# Patient Record
Sex: Female | Born: 1970
Health system: Southern US, Community
[De-identification: ages and names within clinical notes are randomized; demographics above are authoritative.]

## PROBLEM LIST (undated history)

## (undated) DIAGNOSIS — K219 Gastro-esophageal reflux disease without esophagitis: Secondary | ICD-10-CM

## (undated) DIAGNOSIS — B019 Varicella without complication: Secondary | ICD-10-CM

## (undated) DIAGNOSIS — I639 Cerebral infarction, unspecified: Secondary | ICD-10-CM

## (undated) DIAGNOSIS — R519 Headache, unspecified: Secondary | ICD-10-CM

## (undated) DIAGNOSIS — R55 Syncope and collapse: Secondary | ICD-10-CM

## (undated) DIAGNOSIS — I10 Essential (primary) hypertension: Secondary | ICD-10-CM

## (undated) DIAGNOSIS — R51 Headache: Secondary | ICD-10-CM

## (undated) DIAGNOSIS — M1990 Unspecified osteoarthritis, unspecified site: Secondary | ICD-10-CM

## (undated) HISTORY — PX: TUBAL LIGATION: SHX77

## (undated) HISTORY — DX: Gastro-esophageal reflux disease without esophagitis: K21.9

## (undated) HISTORY — DX: Unspecified osteoarthritis, unspecified site: M19.90

## (undated) HISTORY — DX: Headache: R51

## (undated) HISTORY — DX: Varicella without complication: B01.9

## (undated) HISTORY — DX: Syncope and collapse: R55

## (undated) HISTORY — DX: Headache, unspecified: R51.9

---

## 1998-10-21 ENCOUNTER — Inpatient Hospital Stay (HOSPITAL_COMMUNITY): Admission: AD | Admit: 1998-10-21 | Discharge: 1998-10-21 | Payer: Self-pay | Admitting: *Deleted

## 1999-11-07 ENCOUNTER — Emergency Department (HOSPITAL_COMMUNITY): Admission: EM | Admit: 1999-11-07 | Discharge: 1999-11-07 | Payer: Self-pay | Admitting: Emergency Medicine

## 1999-11-07 ENCOUNTER — Encounter: Payer: Self-pay | Admitting: Emergency Medicine

## 2000-02-23 ENCOUNTER — Emergency Department (HOSPITAL_COMMUNITY): Admission: EM | Admit: 2000-02-23 | Discharge: 2000-02-23 | Payer: Self-pay | Admitting: *Deleted

## 2000-02-24 ENCOUNTER — Emergency Department (HOSPITAL_COMMUNITY): Admission: EM | Admit: 2000-02-24 | Discharge: 2000-02-24 | Payer: Self-pay | Admitting: Emergency Medicine

## 2000-02-29 ENCOUNTER — Emergency Department (HOSPITAL_COMMUNITY): Admission: EM | Admit: 2000-02-29 | Discharge: 2000-02-29 | Payer: Self-pay | Admitting: Emergency Medicine

## 2000-03-01 ENCOUNTER — Encounter: Payer: Self-pay | Admitting: *Deleted

## 2000-03-01 ENCOUNTER — Inpatient Hospital Stay (HOSPITAL_COMMUNITY): Admission: AD | Admit: 2000-03-01 | Discharge: 2000-03-01 | Payer: Self-pay | Admitting: Obstetrics & Gynecology

## 2001-03-16 ENCOUNTER — Emergency Department (HOSPITAL_COMMUNITY): Admission: EM | Admit: 2001-03-16 | Discharge: 2001-03-16 | Payer: Self-pay | Admitting: Emergency Medicine

## 2002-04-20 ENCOUNTER — Inpatient Hospital Stay (HOSPITAL_COMMUNITY): Admission: EM | Admit: 2002-04-20 | Discharge: 2002-04-21 | Payer: Self-pay | Admitting: Emergency Medicine

## 2002-04-20 ENCOUNTER — Encounter: Payer: Self-pay | Admitting: Neurology

## 2002-04-20 ENCOUNTER — Encounter: Payer: Self-pay | Admitting: Emergency Medicine

## 2002-11-15 ENCOUNTER — Emergency Department (HOSPITAL_COMMUNITY): Admission: EM | Admit: 2002-11-15 | Discharge: 2002-11-15 | Payer: Self-pay | Admitting: *Deleted

## 2002-11-15 ENCOUNTER — Encounter: Payer: Self-pay | Admitting: *Deleted

## 2004-02-12 ENCOUNTER — Emergency Department (HOSPITAL_COMMUNITY): Admission: EM | Admit: 2004-02-12 | Discharge: 2004-02-12 | Payer: Self-pay | Admitting: Emergency Medicine

## 2004-06-26 ENCOUNTER — Emergency Department (HOSPITAL_COMMUNITY): Admission: EM | Admit: 2004-06-26 | Discharge: 2004-06-26 | Payer: Self-pay | Admitting: Emergency Medicine

## 2004-06-30 ENCOUNTER — Emergency Department (HOSPITAL_COMMUNITY): Admission: EM | Admit: 2004-06-30 | Discharge: 2004-06-30 | Payer: Self-pay | Admitting: Emergency Medicine

## 2004-12-26 ENCOUNTER — Emergency Department (HOSPITAL_COMMUNITY): Admission: EM | Admit: 2004-12-26 | Discharge: 2004-12-27 | Payer: Self-pay | Admitting: Emergency Medicine

## 2005-01-21 ENCOUNTER — Emergency Department (HOSPITAL_COMMUNITY): Admission: EM | Admit: 2005-01-21 | Discharge: 2005-01-21 | Payer: Self-pay | Admitting: Emergency Medicine

## 2005-07-11 ENCOUNTER — Emergency Department (HOSPITAL_COMMUNITY): Admission: EM | Admit: 2005-07-11 | Discharge: 2005-07-12 | Payer: Self-pay | Admitting: Emergency Medicine

## 2006-02-21 ENCOUNTER — Emergency Department (HOSPITAL_COMMUNITY): Admission: EM | Admit: 2006-02-21 | Discharge: 2006-02-21 | Payer: Self-pay | Admitting: Emergency Medicine

## 2006-07-15 ENCOUNTER — Emergency Department (HOSPITAL_COMMUNITY): Admission: EM | Admit: 2006-07-15 | Discharge: 2006-07-15 | Payer: Self-pay | Admitting: Emergency Medicine

## 2006-07-30 ENCOUNTER — Emergency Department (HOSPITAL_COMMUNITY): Admission: EM | Admit: 2006-07-30 | Discharge: 2006-07-30 | Payer: Self-pay | Admitting: Family Medicine

## 2006-11-05 ENCOUNTER — Emergency Department (HOSPITAL_COMMUNITY): Admission: EM | Admit: 2006-11-05 | Discharge: 2006-11-05 | Payer: Self-pay | Admitting: Emergency Medicine

## 2007-03-24 ENCOUNTER — Inpatient Hospital Stay (HOSPITAL_COMMUNITY): Admission: AD | Admit: 2007-03-24 | Discharge: 2007-03-24 | Payer: Self-pay | Admitting: Family Medicine

## 2007-09-11 ENCOUNTER — Emergency Department (HOSPITAL_COMMUNITY): Admission: EM | Admit: 2007-09-11 | Discharge: 2007-09-11 | Payer: Self-pay | Admitting: Emergency Medicine

## 2007-09-12 ENCOUNTER — Emergency Department (HOSPITAL_COMMUNITY): Admission: EM | Admit: 2007-09-12 | Discharge: 2007-09-12 | Payer: Self-pay | Admitting: Emergency Medicine

## 2007-09-21 ENCOUNTER — Emergency Department (HOSPITAL_COMMUNITY): Admission: EM | Admit: 2007-09-21 | Discharge: 2007-09-21 | Payer: Self-pay | Admitting: Emergency Medicine

## 2007-09-27 ENCOUNTER — Inpatient Hospital Stay (HOSPITAL_COMMUNITY): Admission: AD | Admit: 2007-09-27 | Discharge: 2007-09-27 | Payer: Self-pay | Admitting: Family Medicine

## 2007-11-30 ENCOUNTER — Ambulatory Visit (HOSPITAL_COMMUNITY): Admission: RE | Admit: 2007-11-30 | Discharge: 2007-11-30 | Payer: Self-pay | Admitting: Obstetrics and Gynecology

## 2007-12-07 ENCOUNTER — Ambulatory Visit (HOSPITAL_COMMUNITY): Admission: RE | Admit: 2007-12-07 | Discharge: 2007-12-07 | Payer: Self-pay | Admitting: Obstetrics & Gynecology

## 2008-01-27 ENCOUNTER — Ambulatory Visit: Payer: Self-pay | Admitting: Advanced Practice Midwife

## 2008-01-27 ENCOUNTER — Inpatient Hospital Stay (HOSPITAL_COMMUNITY): Admission: AD | Admit: 2008-01-27 | Discharge: 2008-01-27 | Payer: Self-pay | Admitting: Obstetrics & Gynecology

## 2008-02-01 ENCOUNTER — Ambulatory Visit (HOSPITAL_COMMUNITY): Admission: RE | Admit: 2008-02-01 | Discharge: 2008-02-01 | Payer: Self-pay | Admitting: Obstetrics and Gynecology

## 2008-02-04 ENCOUNTER — Ambulatory Visit (HOSPITAL_COMMUNITY): Admission: RE | Admit: 2008-02-04 | Discharge: 2008-02-04 | Payer: Self-pay | Admitting: Family Medicine

## 2008-03-04 ENCOUNTER — Inpatient Hospital Stay (HOSPITAL_COMMUNITY): Admission: AD | Admit: 2008-03-04 | Discharge: 2008-03-05 | Payer: Self-pay | Admitting: Obstetrics & Gynecology

## 2008-03-04 ENCOUNTER — Ambulatory Visit: Payer: Self-pay | Admitting: Obstetrics and Gynecology

## 2008-03-08 ENCOUNTER — Encounter: Payer: Self-pay | Admitting: Family Medicine

## 2008-03-08 ENCOUNTER — Inpatient Hospital Stay (HOSPITAL_COMMUNITY): Admission: AD | Admit: 2008-03-08 | Discharge: 2008-03-13 | Payer: Self-pay | Admitting: Obstetrics & Gynecology

## 2008-03-08 ENCOUNTER — Ambulatory Visit: Payer: Self-pay | Admitting: Obstetrics & Gynecology

## 2008-03-23 ENCOUNTER — Ambulatory Visit: Payer: Self-pay | Admitting: Family Medicine

## 2008-03-27 ENCOUNTER — Ambulatory Visit: Payer: Self-pay | Admitting: Obstetrics & Gynecology

## 2008-03-30 ENCOUNTER — Inpatient Hospital Stay (HOSPITAL_COMMUNITY): Admission: RE | Admit: 2008-03-30 | Discharge: 2008-04-03 | Payer: Self-pay | Admitting: Obstetrics & Gynecology

## 2008-03-30 ENCOUNTER — Ambulatory Visit: Payer: Self-pay | Admitting: Obstetrics and Gynecology

## 2008-03-30 ENCOUNTER — Ambulatory Visit: Payer: Self-pay | Admitting: Obstetrics & Gynecology

## 2008-03-30 ENCOUNTER — Encounter: Payer: Self-pay | Admitting: *Deleted

## 2008-03-30 ENCOUNTER — Ambulatory Visit: Payer: Self-pay | Admitting: Advanced Practice Midwife

## 2008-04-01 ENCOUNTER — Encounter: Payer: Self-pay | Admitting: Obstetrics and Gynecology

## 2008-06-02 ENCOUNTER — Ambulatory Visit: Payer: Self-pay | Admitting: Family Medicine

## 2008-07-13 ENCOUNTER — Ambulatory Visit: Payer: Self-pay | Admitting: Family Medicine

## 2008-07-13 LAB — CONVERTED CEMR LAB
ALT: 38 units/L — ABNORMAL HIGH (ref 0–35)
Albumin: 4.5 g/dL (ref 3.5–5.2)
BUN: 10 mg/dL (ref 6–23)
Basophils Relative: 0 % (ref 0–1)
Calcium: 9 mg/dL (ref 8.4–10.5)
Cholesterol: 207 mg/dL — ABNORMAL HIGH (ref 0–200)
Eosinophils Absolute: 0.1 10*3/uL (ref 0.0–0.7)
Eosinophils Relative: 2 % (ref 0–5)
HCT: 42.6 % (ref 36.0–46.0)
Hemoglobin: 13.8 g/dL (ref 12.0–15.0)
LDL Cholesterol: 140 mg/dL — ABNORMAL HIGH (ref 0–99)
MCHC: 32.4 g/dL (ref 30.0–36.0)
MCV: 94.9 fL (ref 78.0–100.0)
Monocytes Absolute: 0.5 10*3/uL (ref 0.1–1.0)
Monocytes Relative: 8 % (ref 3–12)
Neutro Abs: 2.5 10*3/uL (ref 1.7–7.7)
RBC: 4.49 M/uL (ref 3.87–5.11)
Sodium: 141 meq/L (ref 135–145)
Total CHOL/HDL Ratio: 4
Triglycerides: 76 mg/dL (ref <150)
VLDL: 15 mg/dL (ref 0–40)

## 2009-04-27 ENCOUNTER — Ambulatory Visit: Payer: Self-pay | Admitting: Family Medicine

## 2009-04-27 ENCOUNTER — Emergency Department (HOSPITAL_COMMUNITY): Admission: EM | Admit: 2009-04-27 | Discharge: 2009-04-27 | Payer: Self-pay | Admitting: Emergency Medicine

## 2009-05-24 ENCOUNTER — Ambulatory Visit: Payer: Self-pay | Admitting: Family Medicine

## 2010-02-05 ENCOUNTER — Emergency Department (HOSPITAL_COMMUNITY): Admission: EM | Admit: 2010-02-05 | Discharge: 2010-02-05 | Payer: Self-pay | Admitting: Family Medicine

## 2010-03-05 ENCOUNTER — Emergency Department (HOSPITAL_COMMUNITY): Admission: EM | Admit: 2010-03-05 | Discharge: 2010-03-05 | Payer: Self-pay | Admitting: Emergency Medicine

## 2011-03-03 LAB — DIFFERENTIAL
Basophils Absolute: 0 10*3/uL (ref 0.0–0.1)
Eosinophils Absolute: 0.3 10*3/uL (ref 0.0–0.7)
Eosinophils Relative: 4 % (ref 0–5)
Lymphocytes Relative: 36 % (ref 12–46)
Lymphs Abs: 2.6 10*3/uL (ref 0.7–4.0)
Monocytes Absolute: 0.2 10*3/uL (ref 0.1–1.0)

## 2011-03-03 LAB — POCT I-STAT, CHEM 8
BUN: 13 mg/dL (ref 6–23)
Calcium, Ion: 1.17 mmol/L (ref 1.12–1.32)
Chloride: 105 mEq/L (ref 96–112)
Glucose, Bld: 108 mg/dL — ABNORMAL HIGH (ref 70–99)
TCO2: 25 mmol/L (ref 0–100)

## 2011-03-03 LAB — D-DIMER, QUANTITATIVE: D-Dimer, Quant: <0.22 ug/mL-FEU (ref 0.00–0.48)

## 2011-03-03 LAB — CBC
HCT: 45.2 % (ref 36.0–46.0)
Hemoglobin: 15.6 g/dL — ABNORMAL HIGH (ref 12.0–15.0)
MCV: 95.7 fL (ref 78.0–100.0)
Platelets: 215 10*3/uL (ref 150–400)
RDW: 13.3 % (ref 11.5–15.5)

## 2011-03-03 LAB — PREGNANCY, URINE: Preg Test, Ur: NEGATIVE

## 2011-03-03 LAB — POCT CARDIAC MARKERS: Troponin i, poc: 0.08 ng/mL (ref 0.00–0.09)

## 2011-03-18 LAB — WET PREP, GENITAL
Trich, Wet Prep: NONE SEEN
WBC, Wet Prep HPF POC: NONE SEEN

## 2011-03-18 LAB — POCT I-STAT, CHEM 8
BUN: 6 mg/dL (ref 6–23)
Calcium, Ion: 1.16 mmol/L (ref 1.12–1.32)
Creatinine, Ser: 0.8 mg/dL (ref 0.4–1.2)
Glucose, Bld: 79 mg/dL (ref 70–99)
Sodium: 142 mEq/L (ref 135–145)
TCO2: 24 mmol/L (ref 0–100)

## 2011-03-18 LAB — GC/CHLAMYDIA PROBE AMP, GENITAL: Chlamydia, DNA Probe: NEGATIVE

## 2011-03-18 LAB — URINE MICROSCOPIC-ADD ON

## 2011-03-18 LAB — URINALYSIS, ROUTINE W REFLEX MICROSCOPIC
Glucose, UA: NEGATIVE mg/dL
Nitrite: NEGATIVE
Specific Gravity, Urine: 1.019 (ref 1.005–1.030)
pH: 6 (ref 5.0–8.0)

## 2011-04-22 NOTE — Consult Note (Signed)
[name], Wade                ACCOUNT NO.:  [id number]   MEDICAL RECORD NO.:  [id number]          PATIENT TYPE:  OUT   LOCATION:  MRI                          FACILITY:  [facility]   PHYSICIAN:  Mark C. Vernie Ammons, M.D.  DATE OF BIRTH:  1971/01/06   DATE OF CONSULTATION:  03/10/2008  DATE OF DISCHARGE:  03/08/2008                                 CONSULTATION   HISTORY OF PRESENT ILLNESS:  The patient is a 40 year old black female  seen for suprapubic pain/frequency/hematuria.  The patient has had  microscopic hematuria detected in the past.  However, this past Saturday  she developed significant changes in her voiding pattern with increased  frequency and small frequent voiding.  She reports she then saw pink  discoloration in the urine and pink when she wiped.  [facility], her urine cultured and started on empiric  Macrodantin, but her culture was negative.  She said she subsequently  had seen small clots pass and is now experiencing suprapubic pain.  She  feels like there is a pulling sensation in the area of the urethra.  She has no prior history of recurrent UTIs or similar difficulty with  her previous pregnancies.   PAST MEDICAL HISTORY:  Asthma.   SURGICAL HISTORY:  1. Dilatation and curettage for termination of pregnancy.  2. Tooth extraction.   SOCIAL HISTORY:  The patient denies tobacco or ethanol use.  She has  smoked in the past.   ALLERGIES:  SULFA.   MEDICATIONS ON ADMISSION:  Prilosec, Macrobid, multivitamins.   REVIEW OF SYSTEMS:  As noted above, otherwise, is completely negative.   PHYSICAL EXAMINATION:  GENERAL:  The patient is a well-developed, well-  nourished black female in no apparent distress.  HEENT:  Atraumatic, normocephalic.  Oropharynx clear.  Neck is supple  with midline trachea.  CHEST:  Reveals normal respiratory effort.  CARDIOVASCULAR:  Regular rate and rhythm.  ABDOMEN:  Soft, gravid and tender in the suprapubic  region with no mass  palpable here.  GU:  Exam reveals normal external female genitalia with normally placed  urethral meatus.  The bladder base has no definite mass palpable.  She  has normal anus and perineum.  EXTREMITIES:  Without clubbing, cyanosis, edema.  NEUROLOGICAL:  She is alert, oriented with appropriate mood and affect.  Has no gross focal neurologic deficits.   LABORATORY RESULTS:  Her urinalysis back in October of last year had 21-  50 red blood cells and a urine culture at that time was negative.  In  February this year the UA had 7-10 red blood cells and on March 04, 2008, her urine had too numerous to count red blood cells and 3-6 white  blood cells and a urine culture at that time was negative.  On March 08, 2008, her urinalysis had 7-10  red blood cells, 0-2 white cells.  Her  white count is normal at 10.2.   MRI scan reveals normal kidneys, no hydronephrosis and no bladder  abnormality was seen.  There appears to be a mass anterior  to the  bladder consistent with a fibroid.  Ultrasound revealed what appears to  be a normal bladder.   Marland Kitchen   PROCEDURE:  Cystoscopy:  After informed consent was obtained at the  patient's bedside, her urethra was sterilely prepped with Betadine.  The  17-French flexible cystoscope was then passed through the urethra and  into the bladder.  Upon inspecting the bladder, I note there is some  squamous trigonal metaplasia and normal ureteral orifices bilaterally.  There appears to be normal bladder mucosa except just behind the trigone  is a rounded mass-like effect with a lot of edematous changes on this  mucosal protrusion.  It appears to be some form of mass, although it is  not like anything I have seen before in the bladder.  It is very round  in shape and covered with what appears to be inflamed bladder mucosa.  No other lesions were found within the bladder.   IMPRESSION:  Bladder pain and hematuria secondary to some form of   bladder mass.  It appears to be inflammatory but could be neoplastic in  nature.  This needs to be biopsied once the patient delivers.  I do not  think antibiotics are helping at this point.  Until the patient delivers  and can be safely placed under general anesthetic for evaluation under  anesthesia and a biopsy of this area, supportive care will be necessary.  I also think it would be helpful to repeat the u/s of the bladder, with  the bladder full, in order to better determine if the area seen  cystoscopically is truely within the bladder lumen or possibly just a  fold of undistended bladder mucosa.  I would recommend pain  medication, and if not contraindicated by her pregnancy, anticholinergic  therapy.      Mark C. Vernie Ammons, M.D.  Electronically Signed     MCO/MEDQ  D:  03/10/2008  T:  03/11/2008  Job:  [id number]

## 2011-04-22 NOTE — Discharge Summary (Signed)
Debra Wade, Debra Wade                ACCOUNT NO.:  [id number]   MEDICAL RECORD NO.:  [id number]          PATIENT TYPE:  INP   LOCATION:  9156                          FACILITY:  [facility]   PHYSICIAN:  Ginger Carne, MD  DATE OF BIRTH:  01-03-1971   DATE OF ADMISSION:  03/08/2008  DATE OF DISCHARGE:  03/13/2008                               DISCHARGE SUMMARY   FINAL DIAGNOSES:  The patient has an area of inflammation on her bladder  wall of unclear etiology at this time, likely causing her hematuria.  She will need further workup for specific diagnosis after delivery.   SUMMARY OF LABORATORY DATA:  At admission, the patient had a CBC that  showed a white cell count of 10.2, hemoglobin 12.4, hematocrit 36.5,  platelets 215.  The patient's wet prep was negative.  Urinanalysis  showed large blood, 30 of protein, specific gravity of less than 1.005,  and negative for other findings.  GC and chlamydia test were negative.  Urine culture is negative to date.  The patient had a repeat CBC that  showed a white blood cell count of 9.2, hemoglobin 11.4, hematocrit  33.0, and platelets 204.   RADIOLOGIC STUDIES:  On admission, the patient had a renal ultrasound  that was normal.  MRI of the abdomen and pelvis without contrast showed  no evidence of hydronephrosis, no visible renal or ureteral calculi, no  discrete focal abnormalities of the bladder.  A limited ultrasound of  her pelvis was also normal and a complete ultrasound of the pelvis on  March 11, 2008, showed no discrete bladder abnormality by ultrasound.  Before discharge, the patient had an OB ultrasound that showed an AFI of  12.5 cm.   ADMITTING HISTORY:  This patient is a 40 year old female who is G1, P4-  0-8-4 at 71 weeks' gestation who presented to the maternal adult unit  complaining of  blood in her urine.  She had been seen at the health  department for a routine followup of UTI symptoms and had visited the  MAU the week  earlier.  Her cultures have been negative so far.  The  patient also complained of frequency, urgency, and dysuria.  This  hematuria was associated with abdominal pain that was 10/10 and burning,  persistent.  The patient was admitted to the antenatal unit for further  evaluation and workup of a possible renal stone.  The patient had a  renal ultrasound that showed no signs of a stone.  Later on, an MRI of  the abdomen and pelvis that was also negative for nephrolithiasis.  The  patient's hematuria continued and her pain persisted and due to the  uncertainty of the etiology of her symptoms, urology was consulted.  Dr.  Ihor Gully was in this hospital to see the patient.  He performed a  cystoscopy and saw some edematous changes on the bladder mucosa.  At  first, it was thought that it could be related to a mass of unclear  etiology.  The cystoscopy was repeated on March 12, 2008, and it was  confirmed that  it was not related to a mass, but only changes in the  mucosa.  Following Dr. Margrett Rud recommendation, the patient was started  on Pyridium, VESIcare, and Keflex.  Her pain was well controlled with  Dilaudid.  She was given IV fluids for hydration.  After starting her on  Pyridium and VESIcare, the patient's symptoms improved.   The day of discharge the patient had an OB ultrasound that showed a  normal AFI and has been discharged in stable condition.  The patient  will need to further workup on studying of this bladder abnormality once  she has delivered but for now, she will continue expected management  till delivery.  She will need to follow up with urology after delivery  and before that if symptoms worsen.  The patient will need to continue  taking VESIcare, Pyridium, and Dilaudid as needed for pain.  At the time  of discharge, the patient's vital signs were stable and her pain was  very well controlled.  The patient will need to follow up with her  primary care physician at the  health department within a week of  discharge.  The patient's nonstress test was reassuring throughout her  hospitalization.  We appreciate urology's input on this case.   DISCHARGE MEDICATIONS:  1. Percocet 5/325 mg p.o. q.6 h. as needed for pain.  2. Pyridium 200 mg p.o. 1 tablet q.8 h. as needed for pain.  3. VESIcare 10 mg p.o. daily.  4. Prenatal vitamins daily.   DISPOSITION:  The patient was discharged to her home in a stable  condition.   FOLLOWUP:  She will need to follow up at the health department within a  week of discharge.  The patient will need to set up an appointment.  She  was also advised to follow up with urology if symptoms recur or worsen  before delivery.     ______________________________  Obstetrics Resident      Ginger Carne, MD  Electronically Signed    OR/MEDQ  D:  03/13/2008  T:  03/14/2008  Job:  [id number]   cc:   Veverly Fells. Vernie Ammons, M.D.  Fax: [phone]

## 2011-04-22 NOTE — Discharge Summary (Signed)
Debra Wade, Debra Wade                ACCOUNT NO.:  [id number]   MEDICAL RECORD NO.:  [id number]          PATIENT TYPE:  INP   LOCATION:  9156                          [facility]ACILITY:  [facility]   PHYSICIAN:  Ginger Carne, MD  DATE OF BIRTH:  1971/11/14   DATE OF ADMISSION:  03/08/2008  DATE OF DISCHARGE:  03/13/2008                               DISCHARGE SUMMARY   FINAL DIAGNOSIS:  The patient had an inflammatory area on her bladder  mucosa noted by cystoscopy of unclear etiology.   SUMMARY OF LAB DATA:  At admission, the patient had a CBC that showed a  white blood cell count of 10.2, hemoglobin 12.4, hematocrit 36.5,  platelets 215.  A wet prep was negative.  A urinalysis showed specific  gravity of 1.005, large blood, 30 of protein, but negative for other  findings.  GC and chlamydia testing was negative.  Urine culture is  negative to date.  Followup CBC on March 11, 2008, showed a white blood  cell count of 9.2, hemoglobin 11.4, hematocrit 33.2, and platelets 204.   SUMMARY OF RADIOLOGIC STUDIES:  The patient had a renal ultrasound on  admission that was normal.  An MRI of the abdomen without contrast that  showed no evidence of hydronephrosis.  No visible renal or ureteral  calculi.  No discrete focal abnormality of the bladder.  The MRI of the  pelvis without contrast showed no evidence of ureteral dilatation or  other discrete acute abnormalities.  A limited ultrasound of her pelvis  showed nonfocal abnormalities, and an ultrasound of her pelvis on March 11, 2008, showed no discrete bladder abnormality by ultrasound.  The  patient had an ultrasound day of discharge that showed an AFI of 12.5  cm.   ADMITTING HISTORY:  This patient is a 40 year old female, G1, who had a  white blood cell count of 9.2, hemoglobin 11.4, hematocrit 33.2, and  platelets 204.   RADIOLOGIC STUDIES:  On admission, the patient had a renal ultrasound  that was normal.  An MRI of the abdomen without contrast  showed no  visible renal or ureteral calculi.  No discrete focal abnormality of the  bladder.  There is a 2-cm area of indeterminate etiology just right of  the midline, just above the bladder, and just above the right side of  the symphysis pubis which probably represents the loop of bowel, but  these could be further evaluated by a focused ultrasound of that area if  clinically indicated.  The patient had an MRI of the pelvis at admission  without contrast that showed no evidence of ureteral dilatation or other  discrete acute abnormalities.  The patient had an ultrasound of the  pelvis that was limited on March 09, 2008, that showed no focal  abnormalities identify to correlate with the indeterminate MRI findings.  An ultrasound of her pelvis on March 11, 2008, showed no discrete bladder  abnormality by ultrasound.    Dictation ended at this point.     ______________________________  Obstetrics Resident      Ginger Carne, MD  Electronically Signed    OR/MEDQ  D:  03/13/2008  T:  03/13/2008  Job:  [id number]

## 2011-04-22 NOTE — Discharge Summary (Signed)
NAMECACIE, Debra Wade                ACCOUNT NO.:  [id number]   MEDICAL RECORD NO.:  [id number]          PATIENT TYPE:  INP   LOCATION:  9128                          [facility]ACILITY:  [facility]   PHYSICIAN:  Karlton Lemon, MD      DATE OF BIRTH:  [birth year]   DATE OF ADMISSION:  03/30/2008  DATE OF DISCHARGE:  04/03/2008                               DISCHARGE SUMMARY   ADMISSION DIAGNOSES:  1. Intrauterine pregnancy at this 35 weeks and 0 days.  2. Severe preeclampsia.  3. Group B streptococcus, status unknown.  4. Headaches.  5. History of chronic hypertension.  6. Desired sterilization.   DISCHARGE DIAGNOSES:  1. Postpartum day #4 from spontaneous vaginal delivery.  2. Postoperative day #2 from bilateral tubal ligation.  3. Chronic hypertension with superimposed preeclampsia status post      delivery and magnesium treatment.  4. The GBS status unknown.   PROCEDURES:  1. The patient had magnesium supplementation for his severe      preeclampsia.  2. The patient had a right lateral tubal ligation performed by Dr.      Christin Bach on April 01, 2008.   CONSULTATIONS:  None.   COMPLICATIONS.:  None.   PERTINENT LABORATORY FINDINGS:  On admission, Debra Wade had a UA  showing positive nitrites greater than 300 protein, large blood, 100 of  glucose.  CBC at the time admission, white blood cell count was 9.4,  hemoglobin 12.5, hematocrit 36.5, platelets 212.  Complete metabolic  panel at the time of admission, sodium was 134, potassium 4.4, chloride  101, CO2 22, glucose 78, BUN 4, creatinine 0.64, total bilirubin 0.7,  AST 27, ALT 14.  RPR nonreactive.  On postpartum day #1, the patient had  a repeat CBC with white blood cell count 8.70.  B-MET, hemoglobin 10.3,  hematocrit 30.4, and platelets were 194.   BRIEF PERTINENT ADMISSION HISTORY:  Debra Wade is a 40 year old gravida  54, para 4-0-8-4 with induction of labor for chronic hypertension with  superimposed preeclampsia.  She was  admitted with elevated blood  pressures in the 170s-180s/100s.  She was admitted for magnesium  treatment, severe preeclampsia, and moving towards the delivery.   HOSPITAL COURSE:  The patient was admitted and started on magnesium for  severe preeclampsia.  Induction of labor was begun with Cytotec and then  Pitocin.  Penicillin was started for GBS status unknown at preterm as  estimated gestational age.  The patient progressed to delivery of a  viable infant female weighing 4 pounds 11 ounces with Apgars 9 at 1  minute, 9 at 5 minutes.  The patient tolerated the delivery well and was  doing well on postpartum day #1.  On postpartum #1, she had a bilateral  tubal occlusion performed by Dr. Christin Bach.  The patient stayed 2  more days during which time she was stable.  She did have magnesium  supplementation for 24 hours after delivery.  Her blood pressures  postpartum ranged in the 130s-140s systolic and 80s-90s diastolic.  She  was started on hydrochlorothiazide 25 mg a day at  the time of discharge.  She has already been on labetalol 200 b.i.d. for discharge.  She was  otherwise doing well.  She had stooled, voided, was tolerating p.o., and  physical examination was normal.  She denied headache, vision changes,  right upper quadrant epigastric abdominal pain, and reflexes were 1+.  She stayed an extra day due to the infant not being discharged on  postpartum day #2.  She will be discharged home in stable condition.   DISCHARGE MEDICATIONS:  1. Hydrochlorothiazide 25 mg 1 tablet p.o. daily.  2. Labetalol 200 mg one p.o. every 12 hours as needed.  3. Motrin 600 mg one every 6 hours by mouth as needed for pain.  4. Hycadin 4 ounce liquid 1-2 teaspoons every 4 hours as needed for      cough.   DISCHARGE INSTRUCTIONS:  1. Discharge to home.  2. Regular diet.  3. No sexual activity times 6 weeks.  4. No lifting greater than 10 pounds for 6 weeks.  5. The patient is to have a baby love  nurse follow in 1 week on Apr 10, 2008, for blood pressure check.  6. The patient is to follow up for postpartum examination for 6 weeks      in the health department.      Karlton Lemon, MD  Electronically Signed     NS/MEDQ  D:  04/03/2008  T:  04/03/2008  Job:  [id number]

## 2011-04-22 NOTE — Op Note (Signed)
NAMERUDOLPH, DOBLER                ACCOUNT NO.:  [id number]   MEDICAL RECORD NO.:  [id number]          PATIENT TYPE:  INP   LOCATION:  9371                          FACILITY:  [facility]   PHYSICIAN:  Tilda Burrow, M.D. DATE OF BIRTH:  September 13, 1971   DATE OF PROCEDURE:  DATE OF DISCHARGE:                               OPERATIVE REPORT   PREOPERATIVE DIAGNOSIS:  Elective postpartum sterilization.   POSTOPERATIVE DIAGNOSIS:  Elective postpartum sterilization.   PROCEDURE:  Bilateral partial salpingectomy, Pomeroy technique.   SURGEON:  Tilda Burrow, M.D.   ASSISTANT:  None.   ANESTHESIA:  Epidural, Donald T. Pamalee Leyden, M.D.   COMPLICATIONS:  None.   FINDINGS:  Visibly normal tubes identified to each fimbriated.   SPECIMENS:  Tubal segment to pathology.   DETAILS OF PROCEDURE:  The patient was taken the operating room, prepped  and draped in the usual fashion for umbilical surgery.  Epidural  catheter was reactivated and good analgesic effect obtained.  An  infraumbilical 2-cm skin incision made with careful dissection down to  the fascia, which was opened in semicircular fashion and the peritoneum  bluntly entered.  No suspicion of abdominal injury to internal organs  occurred.  The tubes could be identified on each side after placing a 2-  inch laparotomy tape into the abdomen to hold the bowel and omentum up  then the uterine fundus was grasped with a Babcock clamp and then the  tubal proximal segment identified and traced out to its fimbriated end  with the ovary identified in close proximity.  Some fine filmy adhesions  and adhesions from tube to ovary on the right side were encountered and  dissected.  The mid segment knuckle of tube was then doubly ligated with  2-0 chromic and the incarcerated knuckle of tube cut off and taken as a  surgical specimen for histology.  This similar technique was used on the  opposite side.  Hemostasis was confirmed on each side in sequence.   The  laparotomy tape was removed, the sponge and needle counts correct, the  peritoneum closed with 2-0 chromic at the peritoneal surface, 0 Vicryl  at the fascial layer, and subcuticular 4-0 Dexon closure completed the  procedure.  Sponge and needle counts were correct.  The patient went to  the recovery room in good condition.     Tilda Burrow, M.D.  Electronically Signed    JVF/MEDQ  D:  04/01/2008  T:  04/01/2008  Job:  [id number]

## 2011-04-25 NOTE — H&P (Signed)
[place]. [facility]  Patient:    Debra Wade, Debra Wade Visit Number: [id number] MRN: [id number]          Service Type: MED Location: 3000 [id number] Attending Physician:  Lesly Dukes Dictated by:   Marlan Palau, M.D. Admit Date:  04/20/2002 Discharge Date: 04/21/2002                           History and Physical  HISTORY OF PRESENT ILLNESS:  Debra Wade is a 40 year old right-handed black female -- born 07-22-1971 -- with a history of recent problem with blackout.  The patient claims she was at work at the time, apparently fainted or lost consciousness and was taken home.  The patient apparently began having a bifrontal/retro-orbital headache bilaterally and was noted to have some twitchiness on the right side of her body for a period of time.  The patient has had a headache since that time, developed a right-sided weakness, some tingly sensations in the right hand which have persisted over the last three days.  This patient is able to ambulate but drags her right leg, claims to have some blurring of vision off to the right.  The patient has not had any falls.  No further jerking episodes or blackouts have been noted.  CT scan of the brain done through the emergency room was unremarkable.  Neurology was asked to see this patient for further evaluation.  PAST MEDICAL HISTORY: 1. New-onset syncope. 2. Right hemiparesis, likely psychogenic. 3. Tobacco abuse.  MEDICATIONS:  Depo-Provera.  The patient is on no other medications.  ALLERGIES:  She has an allergy to SULFA DRUGS.  HABITS:  Smokes a pack of cigarettes a day, does not drink alcohol, denies the use of cocaine or marijuana.  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  FAMILY MEDICAL HISTORY:  Notable that mother is alive with a history of hypertension.  Father died with  an MI and stroke.  The patient has two brothers that had stroke, one age 13, one age 37 at the time of the strokes. The patient has 3 brothers who are alive and well and 11 sisters.  REVIEW OF SYSTEMS:  Review of systems is notable for some fever and chills over the last two weeks.  The patient has slow speech since the last three days, had some shortness of breath, some occasional chest pains, no nausea or vomiting.  Denies any problems controlling the bowels or bladder.  Had no tongue-biting or bowel or bladder incontinence with the blackout episode noted previously.  The patient will have occasional dizziness.  The patient denies any prior history of headache before three days ago.  PHYSICAL EXAMINATION:  VITALS:  Blood pressure is 132/77.  Heart rate is 94.  Respiratory rate 20. Temperature:  Afebrile.  GENERAL:  This patient is a fairly well-developed black female who is alert and cooperative at the time of examination.  HEENT:  Head is atraumatic.  Eyes:  Pupils are equal, round and reactive to light.  Disks are flat bilaterally.  NECK:  Supple.  No carotid bruits noted.  RESPIRATORY:  Clear.  CARDIOVASCULAR:  Examination reveals a regular rate and rhythm with no obvious murmurs or rubs noted.  ABDOMEN:  Examination revealed positive bowel sounds, no organomegaly or tenderness noted.  EXTREMITIES:  Without  significant edema.  NEUROLOGIC:  Cranial nerves as above.  Facial symmetry is present.  The patient notes good symmetry to pinprick sensation on the forehead, increased sensitivity on the right face as compared to the left.  The patient has full extraocular movements and visual fields are full.  Speech is well-enunciated, not aphasic and not dysarthric.  Motor testing reveals 5/5 strength in all fours.  Good and symmetric motor tone is noted throughout.  Sensory testing reveals some decreased pinprick sensation in the right arm and hand as compared to the left and  right leg as compared to the left.  Vibratory sensation is depressed on the right arm as compared to the left and more symmetric in the legs.  Position sense was not tested.  The patient has fair finger-to-nose-to-finger and toe-to-finger bilaterally.  No drift is seen. The patient has an hysterical gait pattern, dragging the right leg, but is able to walk on the heels and toes.  The patient has again good symmetry of reflexes.  Toes are neutral bilaterally.  LABORATORY AND ACCESSORY DATA:  CT scan of the head is normal, as above.  Laboratory values are notable for sodium of 139, potassium of 4.3, chloride of 109, CO2 of 25, glucose 92, BUN of 7, creatinine 0.6, calcium 9.5; white count 5.1, hemoglobin of 14.3, hematocrit of 41.6, MCV of 90.9, platelets of 177,000; INR of 0.9.  IMPRESSION: 1. Probable psychogenic right hemiparesis. 2. History of syncope, rule out seizure. 3. History of headache.  This patient likely has a psychogenic problem related to stress, possibly associated with the job.  This patient does not appear to have true neurologic deficits on clinical examination today.  I will admit the patient for further evaluation.  PLAN: 1. Admission to [facility]. 2. MRI of the brain. 3. MR angiogram of the intracranial vessels. 4. EEG study. 5. Will follow the patients clinical course while in house. If blood studies    are unremarkable, will discharge the patient to home.  Will add baby    aspirin at this point. Dictated by:   Marlan Palau, M.D. Attending Physician:  Lesly Dukes DD:  04/20/02 TD:  04/21/02 Job: [phone] [id number]

## 2011-04-25 NOTE — Discharge Summary (Signed)
[place]. [facility]  Patient:    Debra Wade, Debra Wade Visit Number: [id number] MRN: [id number]          Service Type: MED Location: 3000 3024 01 Attending Physician:  Lesly Dukes Dictated by:   Marlan Palau, M.D. Admit Date:  04/20/2002 Discharge Date: 04/21/2002                             Discharge Summary  ADMISSION DIAGNOSES: 1. Probable psychogenic right hemiparesis. 2. Syncope.  DISCHARGE DIAGNOSES: 1. Psychogenic right hemiparesis. 2. Syncope.  PROCEDURES THIS ADMISSION: 1. Magnetic resonance imaging of brain. 2. Electroencephalogram study.  COMPLICATIONS FROM ABOVE PROCEDURES:  None.  HISTORY OF PRESENT ILLNESS:  The patient is a 40 year old right handed black female born [birth year] with a history of headache that occurred while at work three days prior to this admission.  The patient claims she blacked out, had a headache afterwards and has had no recollection of events since that time.  She developed a right sided weakness, gait problems, numbness in the right hand.  The patient was finally persuaded by her fiance to come to the emergency department on the day of admission.  A CT scan of the brain done through the emergency department was unremarkable.  The patient was admitted for further evaluation.  Psychogenic deficit was suspected.  PAST MEDICAL HISTORY: 1. Significant for history of headache and syncope as above. 2. Right hemiparesis likely psychogenic.  MEDICATIONS:  Patient is on Depo-Provera prior to admission.  HABITS:  The patient smokes one pack of cigarettes a day and does not drink alcohol.  ALLERGIES: Patient states allergy to SULFA drugs.  Please refer to the history and physical dictation summary for social history, family history, review of systems and physical examination.  LABORATORY DATA:  Notable for sodium 139, potassium 4.3, chloride 109, carbon dioxide 25, glucose 92, BUN 7, creatinine 0.6,  calcium 9.5.  INR 0.9.  The white blood cell count is 5.1, hemoglobin 14.3, hematocrit 41.6, MCV 90.9, platelet count 177.  HOSPITAL COURSE:  This patient has done well during the course of her hospitalization.  Electrocardiogram was performed in the emergency department and shows normal sinus rhythm with sinus arrhythmia, heart rate 63.  Patient was felt to have psychogenic, possible malingering right hemiparesis.  The patient was scheduled for magnetic resonance imaging of the brain that was unremarkable.  Magnetic resonance imaging angiogram was negative.  The patient has been set up for an electroencephalogram study that has not yet been performed.  If this is unremarkable, the patient will be discharged to home. The right hemiparesis has resolved at this point.  The patient claims she feels better.  The numbness in the right hand she says is gone.  The patient apparently has been ambulating throughout the halls without a limp.  DISCHARGE MEDICATIONS:  None.  FOLLOW UP:  Patient will have no follow up with [facility] following this discharge. Dictated by:   Marlan Palau, M.D. Attending Physician:  Lesly Dukes DD:  04/21/02 TD:  04/23/02 Job: [id number]

## 2011-09-01 LAB — URINE MICROSCOPIC-ADD ON

## 2011-09-01 LAB — URINALYSIS, ROUTINE W REFLEX MICROSCOPIC
Glucose, UA: NEGATIVE
Ketones, ur: NEGATIVE
Leukocytes, UA: NEGATIVE
Nitrite: NEGATIVE
Protein, ur: NEGATIVE
Urobilinogen, UA: 0.2
pH: 6.5

## 2011-09-01 LAB — URINE CULTURE

## 2011-09-02 LAB — CBC
HCT: 33 — ABNORMAL LOW
HCT: 36.5
HCT: 36.5
Hemoglobin: 11.4 — ABNORMAL LOW
Hemoglobin: 12.5
MCV: 96.4
Platelets: 194
RBC: 3.44 — ABNORMAL LOW
RBC: 3.79 — ABNORMAL LOW
RBC: 3.13 — ABNORMAL LOW
RBC: 3.81 — ABNORMAL LOW
RDW: 15.5
WBC: 10.2
WBC: 9.2
WBC: 8.7

## 2011-09-02 LAB — URINALYSIS, ROUTINE W REFLEX MICROSCOPIC
Leukocytes, UA: NEGATIVE
Nitrite: NEGATIVE
Specific Gravity, Urine: <1.005 — ABNORMAL LOW
pH: 7

## 2011-09-02 LAB — DIFFERENTIAL
Eosinophils Absolute: 0.1
Eosinophils Relative: 1
Lymphocytes Relative: 27
Lymphs Abs: 1.8
Lymphs Abs: 2.5
Monocytes Absolute: 0.9
Monocytes Relative: 10
Neutrophils Relative %: 71

## 2011-09-02 LAB — COMPREHENSIVE METABOLIC PANEL
Alkaline Phosphatase: 123 — ABNORMAL HIGH
BUN: 4 — ABNORMAL LOW
CO2: 22
GFR calc non Af Amer: >60
Glucose, Bld: 78
Potassium: 4.4
Total Bilirubin: 0.7
Total Protein: 6.2

## 2011-09-02 LAB — POCT URINALYSIS DIP (DEVICE)
Ketones, ur: NEGATIVE
Operator id: 120861
Protein, ur: >=300 — AB
Protein, ur: >=300 — AB
Specific Gravity, Urine: 1.015
Specific Gravity, Urine: 1.025
pH: 6.5
pH: 7

## 2011-09-02 LAB — URINE MICROSCOPIC-ADD ON

## 2011-09-02 LAB — RPR: RPR Ser Ql: NONREACTIVE

## 2011-09-02 LAB — URINE CULTURE

## 2011-09-02 LAB — GC/CHLAMYDIA PROBE AMP, GENITAL: Chlamydia, DNA Probe: NEGATIVE

## 2011-09-17 LAB — URINALYSIS, ROUTINE W REFLEX MICROSCOPIC
Glucose, UA: NEGATIVE
Glucose, UA: NEGATIVE
Leukocytes, UA: NEGATIVE
Leukocytes, UA: NEGATIVE
Nitrite: NEGATIVE
Protein, ur: 30 — AB
Specific Gravity, Urine: 1.015
Specific Gravity, Urine: 1.02
Urobilinogen, UA: 1
pH: 7

## 2011-09-17 LAB — HCG, QUANTITATIVE, PREGNANCY: hCG, Beta Chain, Quant, S: 99368 — ABNORMAL HIGH

## 2011-09-17 LAB — CBC
MCHC: 33.9
MCV: 95.8
RDW: 13.7

## 2011-09-17 LAB — WET PREP, GENITAL
Clue Cells Wet Prep HPF POC: NONE SEEN
Trich, Wet Prep: NONE SEEN

## 2011-09-17 LAB — URINE CULTURE

## 2011-09-17 LAB — URINE MICROSCOPIC-ADD ON

## 2011-09-17 LAB — GC/CHLAMYDIA PROBE AMP, GENITAL: GC Probe Amp, Genital: NEGATIVE

## 2012-05-11 ENCOUNTER — Encounter (HOSPITAL_COMMUNITY): Payer: Self-pay | Admitting: Emergency Medicine

## 2012-05-11 ENCOUNTER — Emergency Department (HOSPITAL_COMMUNITY)
Admission: EM | Admit: 2012-05-11 | Discharge: 2012-05-11 | Disposition: A | Discharge status: Home / Self Care | Payer: Self-pay | Attending: Emergency Medicine | Admitting: Emergency Medicine

## 2012-05-11 DIAGNOSIS — R42 Dizziness and giddiness: Secondary | ICD-10-CM | POA: Insufficient documentation

## 2012-05-11 DIAGNOSIS — I10 Essential (primary) hypertension: Secondary | ICD-10-CM | POA: Insufficient documentation

## 2012-05-11 DIAGNOSIS — R55 Syncope and collapse: Secondary | ICD-10-CM | POA: Insufficient documentation

## 2012-05-11 DIAGNOSIS — Z79899 Other long term (current) drug therapy: Secondary | ICD-10-CM | POA: Insufficient documentation

## 2012-05-11 DIAGNOSIS — R51 Headache: Secondary | ICD-10-CM | POA: Insufficient documentation

## 2012-05-11 HISTORY — DX: Essential (primary) hypertension: I10

## 2012-05-11 LAB — DIFFERENTIAL
Basophils Relative: 0 % (ref 0–1)
Lymphocytes Relative: 58 % — ABNORMAL HIGH (ref 12–46)
Lymphs Abs: 4.2 10*3/uL — ABNORMAL HIGH (ref 0.7–4.0)
Monocytes Absolute: 0.5 10*3/uL (ref 0.1–1.0)
Monocytes Relative: 6 % (ref 3–12)
Neutro Abs: 2.4 10*3/uL (ref 1.7–7.7)
Neutrophils Relative %: 33 % — ABNORMAL LOW (ref 43–77)

## 2012-05-11 LAB — BASIC METABOLIC PANEL
BUN: 11 mg/dL (ref 6–23)
CO2: 26 mEq/L (ref 19–32)
Chloride: 102 mEq/L (ref 96–112)
Creatinine, Ser: 0.85 mg/dL (ref 0.50–1.10)
GFR calc Af Amer: >90 mL/min (ref >90)
Glucose, Bld: 86 mg/dL (ref 70–99)
Potassium: 3.7 mEq/L (ref 3.5–5.1)

## 2012-05-11 LAB — CBC
HCT: 41.1 % (ref 36.0–46.0)
Hemoglobin: 14.3 g/dL (ref 12.0–15.0)
RBC: 4.38 MIL/uL (ref 3.87–5.11)

## 2012-05-11 NOTE — ED Notes (Signed)
Pt c/o lightheadedness and generalized weakness x 1 week; pt sts takes BP meds and thinks she may be hypotensive; pt sts fatigue and wanting to sleep a lot; pt tearful

## 2012-05-11 NOTE — Discharge Instructions (Signed)
Dizziness Dizziness is a common problem. It is a feeling of unsteadiness or lightheadedness. You may feel like you are about to faint. Dizziness can lead to injury if you stumble or fall. A person of any age group can suffer from dizziness, but dizziness is more common in older adults. CAUSES  Dizziness can be caused by many different things, including:  Middle ear problems.   Standing for too long.   Infections.   An allergic reaction.   Aging.   An emotional response to something, such as the sight of blood.   Side effects of medicines.   Fatigue.   Problems with circulation or blood pressure.   Excess use of alcohol, medicines, or illegal drug use.   Breathing too fast (hyperventilation).   An arrhythmia or problems with your heart rhythm.   Low red blood cell count (anemia).   Pregnancy.   Vomiting, diarrhea, fever, or other illnesses that cause dehydration.   Diseases or conditions such as Parkinson's disease, high blood pressure (hypertension), diabetes, and thyroid problems.   Exposure to extreme heat.  DIAGNOSIS  To find the cause of your dizziness, your caregiver may do a physical exam, lab tests, radiologic imaging scans, or an electrocardiography test (ECG).  TREATMENT  Treatment of dizziness depends on the cause of your symptoms and can vary greatly. HOME CARE INSTRUCTIONS   Drink enough fluids to keep your urine clear or pale yellow. This is especially important in very hot weather. In the elderly, it is also important in cold weather.   If your dizziness is caused by medicines, take them exactly as directed. When taking blood pressure medicines, it is especially important to get up slowly.   Rise slowly from chairs and steady yourself until you feel okay.   In the morning, first sit up on the side of the bed. When this seems okay, stand slowly while holding onto something until you know your balance is fine.   If you need to stand in one place for a  long time, be sure to move your legs often. Tighten and relax the muscles in your legs while standing.   If dizziness continues to be a problem, have someone stay with you for a day or two. Do this until you feel you are well enough to stay alone. Have the person call your caregiver if he or she notices changes in you that are concerning.   Do not drive or use heavy machinery if you feel dizzy.  SEEK IMMEDIATE MEDICAL CARE IF:   Your dizziness or lightheadedness gets worse.   You feel nauseous or vomit.   You develop problems with talking, walking, weakness, or using your arms, hands, or legs.   You are not thinking clearly or you have difficulty forming sentences. It may take a friend or family member to determine if your thinking is normal.   You develop chest pain, abdominal pain, shortness of breath, or sweating.   Your vision changes.   You notice any bleeding.   You have side effects from medicine that seems to be getting worse rather than better.  MAKE SURE YOU:   Understand these instructions.   Will watch your condition.   Will get help right away if you are not doing well or get worse.  Document Released: 05/20/2001 Document Revised: 11/13/2011 Document Reviewed: 06/13/2011 [facility] Patient Information 2012 [place], [place].  Near-Syncope Near-syncope is sudden weakness, dizziness, or feeling like you might pass out (faint). This may occur when getting  up after sitting or while standing for a long period of time. Near-syncope can be caused by a drop in blood pressure. This is a common reaction, but it may occur to a greater degree in people taking medicines to control their blood pressure. Fainting often occurs when the blood pressure or pulse is too low to provide enough blood flow to the brain to keep you conscious. Fainting and near-syncope are not usually due to serious medical problems. However, certain people should be more cautious in the event of near-syncope,  including elderly patients, patients with diabetes, and patients with a history of heart conditions (especially irregular rhythms).  CAUSES   Drop in blood pressure.   Physical pain.   Dehydration.   Heat exhaustion.   Emotional distress.   Low blood sugar.   Internal bleeding.   Heart and circulatory problems.   Infections.  SYMPTOMS   Dizziness.   Feeling sick to your stomach (nauseous).   Nearly fainting.   Body numbness.   Turning pale.   Tunnel vision.   Weakness.  HOME CARE INSTRUCTIONS   Lie down right away if you start feeling like you might faint. Breathe deeply and steadily. Wait until all the symptoms have passed. Most of these episodes last only a few minutes. You may feel tired for several hours.   Drink enough fluids to keep your urine clear or pale yellow.   If you are taking blood pressure or heart medicine, get up slowly, taking several minutes to sit and then stand. This can reduce dizziness that is caused by a drop in blood pressure.  SEEK IMMEDIATE MEDICAL CARE IF:   You have a severe headache.   Unusual pain develops in the chest, abdomen, or back.   There is bleeding from the mouth or rectum, or you have black or tarry stool.   An irregular heartbeat or a very rapid pulse develops.   You have repeated fainting or seizure-like jerking during an episode.   You faint when sitting or lying down.   You develop confusion.   You have difficulty walking.   Severe weakness develops.   Vision problems develop.  MAKE SURE YOU:   Understand these instructions.   Will watch your condition.   Will get help right away if you are not doing well or get worse.  Document Released: 11/24/2005 Document Revised: 11/13/2011 Document Reviewed: 01/10/2011 [facility] Patient Information 2012 [place], [place].

## 2012-05-11 NOTE — ED Provider Notes (Signed)
History  Scribed for [employer]. Bernette Mayers, MD, the patient was seen in room STRE6/STRE6. This chart was scribed by Candelaria Stagers. The patient's care started at 6:45 PM   CSN: [id number]  Arrival date & time 05/11/12  1636   First MD Initiated Contact with Patient 05/11/12 1842      Chief Complaint  Patient presents with  . Dizziness     HPI Debra Wade is a 41 y.o. female who presents to the Emergency Department complaining of headache and one episode of syncope that started today.  Pt states that last week she noticed that her blood pressure was lower than normal.  She reports that she was hypertensive during pregnancy and after until recently.  She denies fever or cough.  Pt takes blood pressure medication.    Past Medical History  Diagnosis Date  . Hypertension     History reviewed. No pertinent past surgical history.  History reviewed. No pertinent family history.  History  Substance Use Topics  . Smoking status: Current Everyday Smoker  . Smokeless tobacco: Not on file  . Alcohol Use: No    OB History    Grav Para Term Preterm Abortions TAB SAB Ect Mult Living                  Review of Systems  Constitutional: Negative for fever.  Respiratory: Negative for cough.   Neurological: Positive for dizziness, syncope and headaches.  All other systems reviewed and are negative.    Allergies  Sulfa drugs cross reactors  Home Medications   Current Outpatient Rx  Name Route Sig Dispense Refill  . LISINOPRIL-HYDROCHLOROTHIAZIDE 10-12.5 MG PO TABS Oral Take 1 tablet by mouth daily.      BP 120/78  Pulse 100  Temp(Src) 98.8 F (37.1 C) (Oral)  Resp 18  SpO2 99%  Physical Exam  Nursing note and vitals reviewed. Constitutional: She is oriented to person, place, and time. She appears well-developed and well-nourished. No distress.  HENT:  Head: Normocephalic and atraumatic.  Eyes: EOM are normal. Pupils are equal, round, and reactive to light.  Neck: Neck  supple. No tracheal deviation present.  Cardiovascular: Normal rate.   Pulmonary/Chest: Effort normal. No respiratory distress.  Abdominal: Soft. She exhibits no distension.  Musculoskeletal: Normal range of motion. She exhibits no edema.  Neurological: She is alert and oriented to person, place, and time. No sensory deficit.  Skin: Skin is warm and dry.  Psychiatric: She has a normal mood and affect. Her behavior is normal.    ED Course  Procedures  DIAGNOSTIC STUDIES: Oxygen Saturation is 99% on room air, normal by my interpretation.    COORDINATION OF CARE:     Labs Reviewed  DIFFERENTIAL - Abnormal; Notable for the following:    Neutrophils Relative 33 (*)    Lymphocytes Relative 58 (*)    Lymphs Abs 4.2 (*)    All other components within normal limits  BASIC METABOLIC PANEL - Abnormal; Notable for the following:    GFR calc non Af Amer 86 (*)    All other components within normal limits  CBC   No results found.   1. Dizziness       MDM  PT is mildy orthostatic by HR, BP is normal here. Advised to hold BP meds if symptomatic or BP is low. Followup with PCP for recheck.    Date: 05/11/2012  Rate: 71  Rhythm: sinus arrhythmia  QRS Axis: normal  Intervals: normal  ST/T  Wave abnormalities: normal  Conduction Disutrbances:none  Narrative Interpretation:   Old EKG Reviewed: unchanged     I personally performed the services described in the documentation, which were scribed in my presence. The recorded information has been reviewed and considered.        Humaira Sculley B. Bernette Mayers, MD 05/11/12 0102

## 2012-09-11 ENCOUNTER — Encounter (HOSPITAL_COMMUNITY): Payer: Self-pay | Admitting: Emergency Medicine

## 2012-09-11 ENCOUNTER — Emergency Department (HOSPITAL_COMMUNITY)
Admission: EM | Admit: 2012-09-11 | Discharge: 2012-09-12 | Disposition: A | Discharge status: Home / Self Care | Payer: Self-pay | Attending: Emergency Medicine | Admitting: Emergency Medicine

## 2012-09-11 DIAGNOSIS — H6690 Otitis media, unspecified, unspecified ear: Secondary | ICD-10-CM | POA: Insufficient documentation

## 2012-09-11 DIAGNOSIS — H6691 Otitis media, unspecified, right ear: Secondary | ICD-10-CM

## 2012-09-11 DIAGNOSIS — Z882 Allergy status to sulfonamides status: Secondary | ICD-10-CM | POA: Insufficient documentation

## 2012-09-11 DIAGNOSIS — J029 Acute pharyngitis, unspecified: Secondary | ICD-10-CM

## 2012-09-11 DIAGNOSIS — F17200 Nicotine dependence, unspecified, uncomplicated: Secondary | ICD-10-CM | POA: Insufficient documentation

## 2012-09-11 MED ORDER — HYDROCODONE-ACETAMINOPHEN 7.5-325 MG/15ML PO SOLN: 15.0000 mL | Freq: Three times a day (TID) | ORAL | Status: DC | PRN

## 2012-09-11 MED ORDER — AMOXICILLIN 500 MG PO CAPS: 500.0000 mg | ORAL_CAPSULE | Freq: Three times a day (TID) | ORAL | Status: DC

## 2012-09-11 NOTE — ED Provider Notes (Signed)
History     CSN: [id number]  Arrival date & time 09/11/12  7829   First MD Initiated Contact with Patient 09/11/12 [phone]      Chief Complaint  Patient presents with  . Otalgia    (Consider location/radiation/quality/duration/timing/severity/associated sxs/prior treatment) HPI Comments: 41 year old female who has had several days of sore throat, mild cough and now has developed 2-3 days of ear pain on the right. The symptoms were gradual in onset, persistent, moderate, gradually worsening and not associated with fevers nausea or vomiting.  The history is provided by the patient.    Past Medical History  Diagnosis Date  . Hypertension     History reviewed. No pertinent past surgical history.  History reviewed. No pertinent family history.  History  Substance Use Topics  . Smoking status: Current Every Day Smoker  . Smokeless tobacco: Not on file  . Alcohol Use: No    OB History    Grav Para Term Preterm Abortions TAB SAB Ect Mult Living                  Review of Systems  Constitutional: Negative for fever and chills.  HENT: Positive for ear pain and sore throat.   Respiratory: Positive for cough.   Gastrointestinal: Negative for nausea and vomiting.  Skin: Negative for rash.    Allergies  Sulfa drugs cross reactors  Home Medications   Current Outpatient Rx  Name Route Sig Dispense Refill  . AMOXICILLIN 500 MG PO CAPS Oral Take 1 capsule (500 mg total) by mouth 3 (three) times daily. 30 capsule 0  . HYDROCODONE-ACETAMINOPHEN 7.5-325 MG/15ML PO SOLN Oral Take 15 mLs by mouth every 8 (eight) hours as needed for pain. 120 mL 0    BP 134/98  Pulse 99  Temp 98 F (36.7 C) (Oral)  Resp 18  SpO2 98%  LMP 09/06/2012  Physical Exam  Nursing note and vitals reviewed. Constitutional: She appears well-developed and well-nourished. No distress.  HENT:  Head: Normocephalic and atraumatic.       Bilateral tympanic membranes are erythematous with mild  opacification and bulging, oropharynx is erythematous bilaterally, no asymmetry exudate hypertrophy. There is moist mucous membranes  Eyes: Conjunctivae normal are normal. No scleral icterus.  Neck: Normal range of motion. Neck supple.       Anterior cervical lymphadenopathy present on the right  Cardiovascular: Normal rate and regular rhythm.   Pulmonary/Chest: Effort normal and breath sounds normal. No respiratory distress. She has no wheezes. She has no rales.  Neurological: She is alert. Coordination normal.  Skin: Skin is warm and dry. No rash noted. She is not diaphoretic.    ED Course  Procedures (including critical care time)  Labs Reviewed - No data to display No results found.   1. Otitis media of right ear   2. Pharyngitis       MDM  Well-appearing female, vital signs are normal, suspect upper respiratory infection such as pharyngitis with a secondary otitis media, patient appears stable for discharge, will be given amoxicillin and hydrocodone suspension.        Vida Roller, MD 09/11/12 [phone]

## 2012-09-11 NOTE — ED Notes (Signed)
Pt reports sore throat x2 days, woke this am with (R) ear pain and swelling to (R) side of neck. Increase pain w/swallowing

## 2012-09-11 NOTE — ED Notes (Signed)
Patient reports right earache for the past two to three days; reports worsening pain this morning.  Patient also complaining of a sore throat and dry cough.  Denies chest pain, shortness of breath, dizziness, and blurred vision.

## 2013-11-20 ENCOUNTER — Encounter (HOSPITAL_COMMUNITY): Payer: Self-pay | Admitting: Emergency Medicine

## 2013-11-20 ENCOUNTER — Emergency Department (HOSPITAL_COMMUNITY)
Admission: EM | Admit: 2013-11-20 | Discharge: 2013-11-20 | Disposition: A | Discharge status: Home / Self Care | Payer: Self-pay | Attending: Emergency Medicine | Admitting: Emergency Medicine

## 2013-11-20 ENCOUNTER — Emergency Department (HOSPITAL_COMMUNITY): Payer: Self-pay

## 2013-11-20 DIAGNOSIS — F17200 Nicotine dependence, unspecified, uncomplicated: Secondary | ICD-10-CM | POA: Insufficient documentation

## 2013-11-20 DIAGNOSIS — R52 Pain, unspecified: Secondary | ICD-10-CM | POA: Insufficient documentation

## 2013-11-20 DIAGNOSIS — J029 Acute pharyngitis, unspecified: Secondary | ICD-10-CM | POA: Insufficient documentation

## 2013-11-20 DIAGNOSIS — I10 Essential (primary) hypertension: Secondary | ICD-10-CM | POA: Insufficient documentation

## 2013-11-20 DIAGNOSIS — J0110 Acute frontal sinusitis, unspecified: Secondary | ICD-10-CM | POA: Insufficient documentation

## 2013-11-20 DIAGNOSIS — R5381 Other malaise: Secondary | ICD-10-CM | POA: Insufficient documentation

## 2013-11-20 MED ORDER — AMOXICILLIN 500 MG PO CAPS: 500.0000 mg | ORAL_CAPSULE | Freq: Three times a day (TID) | ORAL | Status: DC

## 2013-11-20 NOTE — ED Notes (Signed)
Pt c/o cough, sore throat, generalized body aches since Thursday. Pt states cough is productive with yellow sputum. Pt denies N/V/D.

## 2013-11-20 NOTE — ED Notes (Signed)
Pt given detailed discharge instructions, 1 script given. Verbalized understanding of all.

## 2013-11-23 NOTE — ED Provider Notes (Signed)
CSN: [id number]     Arrival date & time 11/20/13  0920 History   First MD Initiated Contact with Patient 11/20/13 [phone]     Chief Complaint  Patient presents with  . Cough  . Sore Throat   (Consider location/radiation/quality/duration/timing/severity/associated sxs/prior Treatment) HPI Comments: Debra Wade is a 42 y.o. Female with a 2 day history of uri type symptoms which includes nasal congestion with thick yellow, blood tinged rhinorrhea, facial pain, sore throat with post nasal drip, low grade fever, body aches, fatigue and nonproductive cough.  Symptoms due to not include shortness of breath, chest pain,  Nausea, vomiting or diarrhea.  The patient has taken tylenol and mucinex DM prior to arrival with no significant improvement in symptoms.      The history is provided by the patient.    Past Medical History  Diagnosis Date  . Hypertension    History reviewed. No pertinent past surgical history. History reviewed. No pertinent family history. History  Substance Use Topics  . Smoking status: Current Every Day Smoker  . Smokeless tobacco: Not on file  . Alcohol Use: No   OB History   Grav Para Term Preterm Abortions TAB SAB Ect Mult Living                 Review of Systems  Constitutional: Positive for fever.  HENT: Positive for congestion, postnasal drip, rhinorrhea, sinus pressure and sore throat. Negative for ear pain, trouble swallowing and voice change.   Eyes: Negative for discharge.  Respiratory: Positive for cough. Negative for shortness of breath, wheezing and stridor.   Cardiovascular: Negative for chest pain.  Gastrointestinal: Negative for abdominal pain.  Genitourinary: Negative.   Musculoskeletal: Positive for myalgias.    Allergies  Sulfa drugs cross reactors  Home Medications   Current Outpatient Rx  Name  Route  Sig  Dispense  Refill  . acetaminophen (TYLENOL) 500 MG tablet   Oral   Take 1,000 mg by mouth every 6 (six) hours as needed for  moderate pain or fever.         Marland Kitchen Dextromethorphan-Guaifenesin (MUCINEX FAST-MAX DM MAX) 5-100 MG/5ML LIQD   Oral   Take 30 mLs by mouth every 6 (six) hours as needed (cough/congestion).         Marland Kitchen ibuprofen (ADVIL,MOTRIN) 200 MG tablet   Oral   Take 400 mg by mouth every 6 (six) hours as needed for fever or moderate pain.         . sodium chloride (OCEAN) 0.65 % nasal spray   Nasal   Place 1 spray into the nose as needed for congestion.         Marland Kitchen amoxicillin (AMOXIL) 500 MG capsule   Oral   Take 1 capsule (500 mg total) by mouth 3 (three) times daily.   30 capsule   0    BP 154/109  Temp(Src) 98.7 F (37.1 C) (Oral)  Resp 20  SpO2 98% Physical Exam  Constitutional: She is oriented to person, place, and time. She appears well-developed and well-nourished.  HENT:  Head: Normocephalic and atraumatic.  Right Ear: Tympanic membrane and ear canal normal.  Left Ear: Tympanic membrane and ear canal normal.  Nose: Mucosal edema and rhinorrhea present. Right sinus exhibits frontal sinus tenderness. Left sinus exhibits frontal sinus tenderness.  Mouth/Throat: Uvula is midline and mucous membranes are normal. No oropharyngeal exudate, posterior oropharyngeal edema, posterior oropharyngeal erythema or tonsillar abscesses.  Eyes: Conjunctivae are normal.  Cardiovascular: Normal rate and  normal heart sounds.   Pulmonary/Chest: Effort normal. No respiratory distress. She has no wheezes. She has no rales. She exhibits no tenderness.  Abdominal: Soft. There is no tenderness.  Musculoskeletal: Normal range of motion.  Neurological: She is alert and oriented to person, place, and time.  Skin: Skin is warm and dry. No rash noted.  Psychiatric: She has a normal mood and affect.    ED Course  Procedures (including critical care time) Labs Review Labs Reviewed - No data to display Imaging Review No results found.  EKG Interpretation   None       MDM   1. Sinusitis, acute  frontal    Exam c/w sinusitis.  Encouraged continuing current meds,  Amoxil added.  Warm compresses to face/forehead, tylenol or motrin for pain and fever, body aches,  Rest,  Fluids, menthol drops to assist with nasal congestion.  Recheck if not improved over the next week.  Encouraged smoking cessation.    Burgess Amor, PA-C 11/23/13 1314

## 2013-11-28 NOTE — ED Provider Notes (Signed)
Medical screening examination/treatment/procedure(s) were performed by non-physician practitioner and as supervising physician I was immediately available for consultation/collaboration.  Shanna Cisco, MD 11/28/13 2221

## 2014-04-16 ENCOUNTER — Emergency Department (HOSPITAL_COMMUNITY): Payer: 59

## 2014-04-16 ENCOUNTER — Encounter (HOSPITAL_COMMUNITY): Payer: Self-pay | Admitting: Emergency Medicine

## 2014-04-16 ENCOUNTER — Emergency Department (HOSPITAL_COMMUNITY)
Admission: EM | Admit: 2014-04-16 | Discharge: 2014-04-16 | Disposition: A | Discharge status: Home / Self Care | Payer: 59 | Attending: Emergency Medicine | Admitting: Emergency Medicine

## 2014-04-16 DIAGNOSIS — I10 Essential (primary) hypertension: Secondary | ICD-10-CM

## 2014-04-16 DIAGNOSIS — Z8673 Personal history of transient ischemic attack (TIA), and cerebral infarction without residual deficits: Secondary | ICD-10-CM | POA: Insufficient documentation

## 2014-04-16 DIAGNOSIS — F17200 Nicotine dependence, unspecified, uncomplicated: Secondary | ICD-10-CM | POA: Insufficient documentation

## 2014-04-16 DIAGNOSIS — M25469 Effusion, unspecified knee: Secondary | ICD-10-CM | POA: Insufficient documentation

## 2014-04-16 DIAGNOSIS — R197 Diarrhea, unspecified: Secondary | ICD-10-CM | POA: Insufficient documentation

## 2014-04-16 DIAGNOSIS — M25569 Pain in unspecified knee: Secondary | ICD-10-CM | POA: Insufficient documentation

## 2014-04-16 DIAGNOSIS — Z792 Long term (current) use of antibiotics: Secondary | ICD-10-CM | POA: Insufficient documentation

## 2014-04-16 DIAGNOSIS — R42 Dizziness and giddiness: Secondary | ICD-10-CM | POA: Insufficient documentation

## 2014-04-16 HISTORY — DX: Cerebral infarction, unspecified: I63.9

## 2014-04-16 LAB — URINE MICROSCOPIC-ADD ON

## 2014-04-16 LAB — COMPREHENSIVE METABOLIC PANEL
ALBUMIN: 4.1 g/dL (ref 3.5–5.2)
ALT: 10 U/L (ref 0–35)
AST: 17 U/L (ref 0–37)
Alkaline Phosphatase: 45 U/L (ref 39–117)
BUN: 8 mg/dL (ref 6–23)
CALCIUM: 9.2 mg/dL (ref 8.4–10.5)
CO2: 26 mEq/L (ref 19–32)
CREATININE: 0.79 mg/dL (ref 0.50–1.10)
Chloride: 103 mEq/L (ref 96–112)
GFR calc Af Amer: >90 mL/min (ref >90)
Glucose, Bld: 99 mg/dL (ref 70–99)
Potassium: 3.5 mEq/L — ABNORMAL LOW (ref 3.7–5.3)
Sodium: 142 mEq/L (ref 137–147)
Total Bilirubin: 0.5 mg/dL (ref 0.3–1.2)
Total Protein: 7.6 g/dL (ref 6.0–8.3)

## 2014-04-16 LAB — URINALYSIS, ROUTINE W REFLEX MICROSCOPIC
Bilirubin Urine: NEGATIVE
GLUCOSE, UA: NEGATIVE mg/dL
Ketones, ur: NEGATIVE mg/dL
LEUKOCYTES UA: NEGATIVE
Nitrite: NEGATIVE
Protein, ur: 30 mg/dL — AB
SPECIFIC GRAVITY, URINE: 1.025 (ref 1.005–1.030)
UROBILINOGEN UA: 0.2 mg/dL (ref 0.0–1.0)
pH: 7 (ref 5.0–8.0)

## 2014-04-16 LAB — CBC WITH DIFFERENTIAL/PLATELET
BASOS ABS: 0 10*3/uL (ref 0.0–0.1)
BASOS PCT: 0 % (ref 0–1)
EOS PCT: 2 % (ref 0–5)
Eosinophils Absolute: 0.2 10*3/uL (ref 0.0–0.7)
HEMATOCRIT: 42.8 % (ref 36.0–46.0)
Hemoglobin: 14.3 g/dL (ref 12.0–15.0)
Lymphocytes Relative: 40 % (ref 12–46)
Lymphs Abs: 3.3 10*3/uL (ref 0.7–4.0)
MCH: 31.9 pg (ref 26.0–34.0)
MCHC: 33.4 g/dL (ref 30.0–36.0)
MCV: 95.5 fL (ref 78.0–100.0)
MONO ABS: 0.7 10*3/uL (ref 0.1–1.0)
Monocytes Relative: 8 % (ref 3–12)
Neutro Abs: 4 10*3/uL (ref 1.7–7.7)
Neutrophils Relative %: 50 % (ref 43–77)
Platelets: 249 10*3/uL (ref 150–400)
RBC: 4.48 MIL/uL (ref 3.87–5.11)
RDW: 13 % (ref 11.5–15.5)
WBC: 8.2 10*3/uL (ref 4.0–10.5)

## 2014-04-16 MED ORDER — LABETALOL HCL 5 MG/ML IV SOLN
10.0000 mg | Freq: Once | INTRAVENOUS | Status: AC
  Administered 2014-04-16: 10 mg via INTRAVENOUS
  Filled 2014-04-16: qty 4

## 2014-04-16 MED ORDER — TRAMADOL HCL 50 MG PO TABS: 50.0000 mg | ORAL_TABLET | Freq: Four times a day (QID) | ORAL | Status: DC | PRN

## 2014-04-16 MED ORDER — IBUPROFEN 200 MG PO TABS
600.0000 mg | ORAL_TABLET | Freq: Three times a day (TID) | ORAL | Status: AC
Start: 2014-04-16 — End: 2014-04-19

## 2014-04-16 MED ORDER — SODIUM CHLORIDE 0.9 % IV BOLUS (SEPSIS)
1000.0000 mL | Freq: Once | INTRAVENOUS | Status: AC
  Administered 2014-04-16: 1000 mL via INTRAVENOUS

## 2014-04-16 MED ORDER — LISINOPRIL 5 MG PO TABS: 5.0000 mg | ORAL_TABLET | Freq: Every day | ORAL | Status: DC

## 2014-04-16 MED ORDER — IBUPROFEN 800 MG PO TABS
800.0000 mg | ORAL_TABLET | Freq: Once | ORAL | Status: AC
  Administered 2014-04-16: 800 mg via ORAL
  Filled 2014-04-16: qty 1

## 2014-04-16 NOTE — ED Provider Notes (Signed)
CSN: [id number]     Arrival date & time 04/16/14  0746 History  This chart was scribed for Debra Muskrat, MD by Zettie Pho, ED Scribe. This patient was seen in room [facility] and the patient's care was started at 8:07 AM.     Chief Complaint  Patient presents with  . Knee Pain   The history is provided by the patient. No language interpreter was used.   HPI Comments: Debra Wade is a 43 y.o. female who presents to the Emergency Department complaining of an intermittent, shooting pain with associated swelling to the right knee with sudden onset 2-3 days ago. She states that the pain is exacerbated with walking/bearing weight, but alleviated with resting with the leg elevated. She reports a history of varicose veins, but states that her current pain is new for her. Patient denies any potential injury or trauma to the area. She reports a few episodes of watery diarrhea over the past several days. She denies fever, vomiting, incontinence. Patient has an allergy to sulfa drug cross reactors.  Patient also has a history of HTN and stroke and expresses concern about her blood pressure since she states she has not taken HCTZ or followed up with a PCP in about a year. Patient's BP is 187/108 upon arrival to the ED. She reports intermittent episodes of dizziness for the past 2 years, but denies any currently.   Past Medical History  Diagnosis Date  . Hypertension    No past surgical history on file. No family history on file. History  Substance Use Topics  . Smoking status: Current Every Day Smoker  . Smokeless tobacco: Not on file  . Alcohol Use: No   OB History   Grav Para Term Preterm Abortions TAB SAB Ect Mult Living                 Review of Systems  Constitutional: Negative for fever.       Per HPI, otherwise negative  HENT:       Per HPI, otherwise negative  Respiratory:       Per HPI, otherwise negative  Cardiovascular:       Per HPI, otherwise negative   Gastrointestinal: Positive for diarrhea. Negative for vomiting.  Endocrine:       Negative aside from HPI  Genitourinary:       Neg aside from HPI   Musculoskeletal: Positive for arthralgias and joint swelling.       Per HPI, otherwise negative  Skin: Negative.   Neurological: Negative for syncope.      Allergies  Sulfa drugs cross reactors  Home Medications   Prior to Admission medications   Medication Sig Start Date End Date Taking? Authorizing Provider  acetaminophen (TYLENOL) 500 MG tablet Take 1,000 mg by mouth every 6 (six) hours as needed for moderate pain or fever.    Historical Provider, MD  amoxicillin (AMOXIL) 500 MG capsule Take 1 capsule (500 mg total) by mouth 3 (three) times daily. 11/20/13   Evalee Jefferson, PA-C  Dextromethorphan-Guaifenesin ([place] FAST-MAX DM MAX) 5-100 MG/5ML LIQD Take 30 mLs by mouth every 6 (six) hours as needed (cough/congestion).    Historical Provider, MD  ibuprofen (ADVIL,MOTRIN) 200 MG tablet Take 400 mg by mouth every 6 (six) hours as needed for fever or moderate pain.    Historical Provider, MD  sodium chloride (OCEAN) 0.65 % nasal spray Place 1 spray into the nose as needed for congestion.    Historical Provider, MD  Triage Vitals: BP 187/108  Pulse 96  Temp(Src) 98.1 F (36.7 C) (Oral)  Resp 20  Ht 5\' 10"  (1.778 m)  Wt 170 lb (77.111 kg)  BMI 24.39 kg/m2  SpO2 100%  LMP 04/09/2014  Physical Exam  Nursing note and vitals reviewed. Constitutional: She is oriented to person, place, and time. She appears well-developed and well-nourished. No distress.  HENT:  Head: Normocephalic and atraumatic.  Eyes: Conjunctivae and EOM are normal.  Cardiovascular: Normal rate, regular rhythm and intact distal pulses.   Pulses:      Dorsalis pedis pulses are 2+ on the right side, and 2+ on the left side.  Pulmonary/Chest: Effort normal and breath sounds normal. No stridor. No respiratory distress.  Abdominal: She exhibits no distension.   Musculoskeletal: She exhibits no edema.       Right knee: She exhibits decreased range of motion and effusion.  Restricted range of motion to 180/160 secondary to pain in the patella tendon area. Palpable effusion around the patella tendon.   Neurological: She is alert and oriented to person, place, and time. No cranial nerve deficit.  Skin: Skin is warm and dry.  Psychiatric: She has a normal mood and affect.    ED Course  Procedures (including critical care time)  DIAGNOSTIC STUDIES: Oxygen Saturation is 100% on room air, normal by my interpretation.    COORDINATION OF CARE: 8:15 AM- Will order x-rays of the chest and right knee, UA, CBC, CMP, EKG. Ordered IV fluids and ibuprofen to manage symptoms. Discussed treatment plan with patient at bedside and patient verbalized agreement.     Labs Review Labs Reviewed  COMPREHENSIVE METABOLIC PANEL - Abnormal; Notable for the following:    Potassium 3.5 (*)    All other components within normal limits  URINALYSIS, ROUTINE W REFLEX MICROSCOPIC - Abnormal; Notable for the following:    Hgb urine dipstick LARGE (*)    Protein, ur 30 (*)    All other components within normal limits  URINE MICROSCOPIC-ADD ON - Abnormal; Notable for the following:    Squamous Epithelial / LPF FEW (*)    Bacteria, UA FEW (*)    All other components within normal limits  CBC WITH DIFFERENTIAL    Imaging Review Dg Chest 2 View  04/16/2014   CLINICAL DATA:  Chest discomfort.  Previous stroke.  Hypertension.  EXAM: CHEST  2 VIEW  COMPARISON:  11/20/2013  FINDINGS: The heart size and mediastinal contours are within normal limits. Both lungs are clear. The visualized skeletal structures are unremarkable.  IMPRESSION: No active cardiopulmonary disease.   Electronically Signed   By: Earle Gell M.D.   On: 04/16/2014 09:11   Dg Knee Complete 4 Views Right  04/16/2014   CLINICAL DATA:  Right knee pain and swelling.  EXAM: RIGHT KNEE - COMPLETE 4+ VIEW  COMPARISON:   None.  FINDINGS: No evidence of fracture or dislocation. Possible knee joint effusion suspected on the lateral view although it is suboptimal due to partial rotation. No evidence of knee joint arthropathy or other significant bone abnormality.  IMPRESSION: Suspect knee joint effusion.  No osseous abnormality.   Electronically Signed   By: Earle Gell M.D.   On: 04/16/2014 09:15     EKG Interpretation   Date/Time:  Sunday Apr 16 2014 08:40:26 EDT Ventricular Rate:  77 PR Interval:  159 QRS Duration: 81 QT Interval:  397 QTC Calculation: 449 R Axis:   31 Text Interpretation:  Sinus rhythm Probable left atrial enlargement  Sinus  rhythm Artifact early LVH? No significant change since last tracing  Abnormal ekg Confirmed by Debra Muskrat  MD [phone]) on 04/16/2014 9:19:52  AM     On repeat exam the patient appears calm, is smiling. Blood pressure has improved, 130/105. We had a lengthy conversation about initiating therapy for hypertension, as well as possible diagnosis for her knee pain, including concern for, though low suspicion of infectious pathology.  The knee pain is more likely inflammatory, and the patient will receive empiric therapy for this.  MDM   I personally performed the services described in this documentation, which was scribed in my presence. The recorded information has been reviewed and is accurate.   Patient presents with concern of both knee pain and ongoing blood pressure issues. No lab evidence of endorgan effects.  The patient's knee is painful, though neither warm nor erythematous, and with no fever, leukocytosis there is low suspicion for septic arthritis. Patient's pain may be secondary to gout versus degenerative changes. There is no sufficient effusion for obtaining a specimen.  Patient started on anti-inflammatories, cryotherapy, will follow with orthopedics. Patient's blood pressure issues or progress with initiation of medication she has previously taken  for blood pressure control.   Debra Muskrat, MD 04/16/14 [phone]

## 2014-04-16 NOTE — ED Notes (Signed)
Pt c/o swelling and pain to right knee area that started Friday, denies any injury, cms intact distal, pain is worse with movement, pt is also concerned because she was taken off her blood pressure medication  (HCTZ 10 mg  and unsure of additional blood pressure medication was) a year ago and has not followed up with a pcp since then or having her blood pressure checked regularly, has been having "dizzy" spells for the past two years intermittently,.

## 2014-04-16 NOTE — Discharge Instructions (Signed)
As discussed, with your elevated blood pressure it is important to follow up with the primary care physician for repeat exam in one week.  In addition, please to follow up with our orthopedist for further evaluation and management of your knee pain.  Return here for any concerning changes in your condition, such as those we discussed.       Emergency Department Resource Guide 1) Find a Doctor and Pay Out of Pocket Although you won't have to find out who is covered by your insurance plan, it is a good idea to ask around and get recommendations. You will then need to call the office and see if the doctor you have chosen will accept you as a new patient and what types of options they offer for patients who are self-pay. Some doctors offer discounts or will set up payment plans for their patients who do not have insurance, but you will need to ask so you aren't surprised when you get to your appointment.  2) Contact Your Local Health Department Not all health departments have doctors that can see patients for sick visits, but many do, so it is worth a call to see if yours does. If you don't know where your local health department is, you can check in your phone book. The CDC also has a tool to help you locate your state's health department, and many state websites also have listings of all of their local health departments.  3) Find a [facility] If your illness is not likely to be very severe or complicated, you may want to try a walk in clinic. These are popping up all over the country in pharmacies, drugstores, and shopping centers. They're usually staffed by nurse practitioners or physician assistants that have been trained to treat common illnesses and complaints. They're usually fairly quick and inexpensive. However, if you have serious medical issues or chronic medical problems, these are probably not your best option.  No Primary Care Doctor: - Call Health Connect at  [phone] - they can  help you locate a primary care doctor that  accepts your insurance, provides certain services, etc. - Physician Referral Service- [phone]  Chronic Pain Problems: Organization         Address  Phone   Notes  [facility]  [phone] Patients need to be referred by their primary care doctor.   Medication Assistance: Organization         Address  Phone   Notes  [facility] Medication [facility] [address] [place], [place] [address] [phone] --Must be a resident of [facility] -- Must have NO insurance coverage whatsoever (no Medicaid/ Medicare, etc.) -- The pt. MUST have a primary care doctor that directs their care regularly and follows them in the community   MedAssist  [phone]   [employer]  [phone]    Agencies that provide inexpensive medical care: Organization         Address  Phone   Notes  [place]  [phone]   [facility]    [phone]   [facility] [place], [place] [address] [phone]   [place] [address] [place] [phone]   Planned Parenthood    [phone]   [facility]    [phone]   [place] and [place] [address] [place], [employer] Phone:  (  [phone], Fax:  [phone] Hours of Operation:  9 am - 6 pm, M-F.  Also accepts Medicaid/Medicare and self-pay.  [facility] for [place], [address], [place] Phone: [phone], Fax: [phone]. Hours of Operation:  8:30 am - 5:30 pm, M-F.  Also accepts Medicaid and self-pay.  [facility] [place] [address] [place] Phone: [phone]   [place], [place], [place] [phone], Ext. [phone] Mondays & Thursdays: 7-9 AM.  First 15 patients are seen on a first come, first serve basis.    [place] Providers:  Organization         Address  Phone   Notes  [facility] [address], [place] [phone] Also accepts self-pay patients.  [facility] [address], [place]  [phone]   [place], [address], [place] [phone]   [facility] [address], [place] [phone]   Lucianne Lei [address], [place]   [phone] Only accepts [place] Access [place] patients after they have their name applied to their card.   Self-Pay (no insurance) in [facility]:  Organization         Address  Phone   Notes  Sickle Cell Patients, [facility] [place] [phone]   [facility] [place] [phone]   [facility] [place]  [place], [place], [place] [phone]   [facility]/Dr. Osei-Bonsu  [address], [place] or [address], [place] [phone] Phone number for both [place] and [place] locations is the same.  [facility] [address] [place] [phone]   [facility] [place] [address] [place] or [address] [phone]   [facility] [address], [place] [phone], phone; [phone], fax Sees patients 1st and 3rd Saturday of every month.  Must not qualify for public or private insurance (i.e. Medicaid, Medicare, [place] Health Choice, Veterans' Benefits)  Household income should be no more than 200% of the poverty level The clinic cannot treat you if you are pregnant or think you are pregnant  Sexually transmitted diseases are not treated at the clinic.   Dental Care: Organization         Address  Phone  Notes  [facility] Department of [facility] [place] [phone] Accepts children up to age 107 who are enrolled in [place] or [place]; pregnant women with a Medicaid card; and children who have applied for Medicaid or [place] Health Choice, but were declined, whose parents can pay a reduced fee at time of service.  [facility] Department of [facility]  [address], [place] [phone] Accepts children up to age 106 who are enrolled in [place] or [place]; pregnant women with a Medicaid card; and children who have applied for Medicaid or [place] Health Choice, but were declined, whose parents can pay a reduced fee at time of service.  [place] Adult [facility] Access PROGRAM  [place] [phone] Patients are seen by appointment only. Walk-ins are  not accepted. [place] will see patients 23 years of age and older. Monday - Tuesday (8am-5pm) Most Wednesdays (8:30-5pm) $30 per visit, cash only  [facility] Adult Dental Access PROGRAM  [address], [facility] [phone] Patients are seen by appointment only. Walk-ins are not accepted. [place] will see patients 35 years of age and older. One Wednesday Evening (Monthly: Volunteer Based).  $30 per visit, cash only  [place]  [phone] for adults; Children under age 76, call Graduate Pediatric Dentistry at [phone]. Children aged 78-14, please call [phone] to request a pediatric application.  Dental services are provided in all areas of dental care including fillings, crowns and bridges, complete and partial dentures, implants, gum treatment, root canals, and extractions. Preventive care is also provided. Treatment is provided to both adults and children. Patients are selected via a lottery and there is often a waiting list.   [facility] [address], [place]  [phone] [email or web address]   [facility] [address] [place], [place] [phone], Ext. [phone] Second and Fourth Thursday of each month, opens at 6:30 AM; Clinic ends at 9 AM.  Patients are seen on a first-come first-served basis, and a limited number are seen during each clinic.   [facility]  [address] [place], [place] [phone]   Eligibility Requirements You must have lived in [place], [place], or [place] counties for at least the last three months.   You cannot be eligible for state or federal sponsored [employer], including [employer], [place], or [employer].   You generally cannot be eligible for healthcare insurance through your employer.    How to apply: Eligibility screenings are held every Tuesday and Wednesday afternoon from 1:00 pm until 4:00 pm. You do not need an appointment for the interview!  [facility] [address], [place], [place]   [place]  [place] Department  [place]  [phone]    Behavioral Health Resources in the Community: Intensive Outpatient Programs Organization         Address  Phone  Notes  [place]. [address], [place], [place] [phone]   [facility] Outpatient [address], [place], [place]   ADS: Alcohol & Drug Svcs [address], [place], [place]   [place] [address],  [place], [place] or [phone]   Substance Abuse Resources Organization         Address  Phone  Notes  Alcohol and Drug Services  [phone]   [place]  [phone]   [facility] [place]   Chinita Pester  [phone]   Residential & Outpatient Substance Abuse Program  [phone]   Psychological Services Organization         Address  Phone  Notes  [facility] [place]  [place]  [phone]   [place] [address] [place] [phone] or [phone]    Mobile Crisis Teams Organization         Address  Phone  Notes  [facility], Mobile Crisis Care Unit  [phone]   [facility]  [address] [place], [place]   [facility] [address] [place] [phone]    Self-Help/Support  Groups Organization         Address  Phone             Notes  [facility]ental [facility] Assoc[facility] of [place] - variety of support groups  [place] Call for more information  Narcotics Anonymous (NA), [facility] [address], [employer] [place]  2 meetings at this location   Special educational needs teacher         Address  Phone  Notes  [facility]AP Residential Treatment [place],    [place]  [phone]   [facility]  [address], [place] [address], [place], [place]   [place], [place] [phone] Admissions: 8am-3pm M-F  Incentives Substance [place] [address],    [place], [place] [phone]   [facility] [address] [place], [place], [place]   [facility] [address],  [place], [place]   [facility] - Intensive Outpatient [address], [place], [place]   [facility] ([place].,  [place], [place] [phone] or [phone]   [facility] Services (RTS) [address], [place], [place] Accepts Medicaid  [facility] [place] [address]  [place] [phone] Substance Abuse/Addiction Treatment   [facility] Organization         Address  Phone  Notes  [facility]  [phone]   Domenic Schwab, PhD [address] [place], [place]   [phone] or  [phone]   [place], [place] [phone]   [facility] [address], [place], [place] [phone] Insurance/Medicaid/sponsorship through [facility] and Families [address] [place]                                    [place], [place] [phone] [place] [address], [place] [phone]    Dr. Adele Schilder  [phone]   [facility] Dept. 1) [address], [place] 2) [place] 3)  [place] [address], [place] [phone]  [phone]   [place] [phone] or [phone] (After Hours)

## 2014-04-25 ENCOUNTER — Telehealth: Payer: Self-pay | Admitting: Orthopedic Surgery

## 2014-04-25 NOTE — Telephone Encounter (Signed)
Call received from patient today, 04/25/14; states had been seen at [facility] Emergency room 04/16/14 for problem of sudden right knee swelling, as well as hypertension.  Discussed appointment and offered first available, then in process, found that her [place] [employer] is the "compass" plan, which requires primary care referral.  She states this insurance does have a primary care assigned, although she's not yet been there and does not know where the physician is located.  She will call primary care and also her insurance, then will call back if needs to let us know to expect a referral.  Her ph# is  [phone].

## 2014-05-09 NOTE — Telephone Encounter (Signed)
No further response. 

## 2014-07-05 ENCOUNTER — Ambulatory Visit: Payer: Self-pay | Admitting: Obstetrics & Gynecology

## 2014-12-04 ENCOUNTER — Encounter: Payer: Self-pay | Admitting: *Deleted

## 2014-12-05 ENCOUNTER — Encounter: Payer: Self-pay | Admitting: Obstetrics & Gynecology

## 2015-02-11 ENCOUNTER — Emergency Department (HOSPITAL_COMMUNITY)
Admission: EM | Admit: 2015-02-11 | Discharge: 2015-02-12 | Disposition: A | Discharge status: Home / Self Care | Payer: 59 | Attending: Emergency Medicine | Admitting: Emergency Medicine

## 2015-02-11 ENCOUNTER — Encounter (HOSPITAL_COMMUNITY): Payer: Self-pay

## 2015-02-11 DIAGNOSIS — Z7982 Long term (current) use of aspirin: Secondary | ICD-10-CM | POA: Insufficient documentation

## 2015-02-11 DIAGNOSIS — Z79899 Other long term (current) drug therapy: Secondary | ICD-10-CM | POA: Insufficient documentation

## 2015-02-11 DIAGNOSIS — Z8673 Personal history of transient ischemic attack (TIA), and cerebral infarction without residual deficits: Secondary | ICD-10-CM | POA: Insufficient documentation

## 2015-02-11 DIAGNOSIS — I10 Essential (primary) hypertension: Secondary | ICD-10-CM | POA: Insufficient documentation

## 2015-02-11 DIAGNOSIS — J069 Acute upper respiratory infection, unspecified: Secondary | ICD-10-CM

## 2015-02-11 DIAGNOSIS — Z720 Tobacco use: Secondary | ICD-10-CM | POA: Insufficient documentation

## 2015-02-11 MED ORDER — ACETAMINOPHEN 325 MG PO TABS
650.0000 mg | ORAL_TABLET | Freq: Once | ORAL | Status: AC
  Administered 2015-02-11: 650 mg via ORAL
  Filled 2015-02-11: qty 2

## 2015-02-11 NOTE — ED Provider Notes (Signed)
CSN: [id number]     Arrival date & time 02/11/15  2208 History  This chart was scribed for Hoy Morn, MD by Edison Simon, ED Scribe. This patient was seen in room APA09/APA09 and the patient's care was started at 11:30 PM.    Chief Complaint  Patient presents with  . Fever   The history is provided by the patient. No language interpreter was used.    HPI Comments: Debra Wade is a 44 y.o. female who presents to the Emergency Department complaining of fever and other symptoms with onset yesterday. She hashad sneezing, nasal congestion, postnasal drip, hearing muffled, dry cough, sore throat, and myalgias. She states she has been using Ibuprofen, last used 800mg  at 2000, with some improvement. She works at a nursing home. She denies SOB, nausea, vomiting, diarrhea, dysuria, or frequency.  PCP: Dr. Jeanie Cooks  Past Medical History  Diagnosis Date  . Hypertension   . Stroke    Past Surgical History  Procedure Laterality Date  . Tubal ligation     No family history on file. History  Substance Use Topics  . Smoking status: Current Every Day Smoker -- 0.50 packs/day    Types: Cigarettes  . Smokeless tobacco: Not on file  . Alcohol Use: No   OB History    No data available     Review of Systems A complete 10 system review of systems was obtained and all systems are negative except as noted in the HPI and PMH.    Allergies  Sulfa drugs cross reactors  Home Medications   Prior to Admission medications   Medication Sig Start Date End Date Taking? Authorizing Provider  aspirin EC 81 MG tablet Take 81 mg by mouth daily.    Historical Provider, MD  lisinopril (PRINIVIL,ZESTRIL) 5 MG tablet Take 1 tablet (5 mg total) by mouth daily. 04/16/14   [name], MD  loratadine (CLARITIN) 10 MG tablet Take 10 mg by mouth daily as needed for allergies.    Historical Provider, MD  sodium chloride (OCEAN) 0.65 % nasal spray Place 1 spray into the nose as needed for congestion.     Historical Provider, MD  traMADol (ULTRAM) 50 MG tablet Take 1 tablet (50 mg total) by mouth every 6 (six) hours as needed. 04/16/14   [name], MD   BP 173/108 mmHg  Pulse 86  Temp(Src) 98.6 F (37 C) (Oral)  Resp 20  Ht 5\' 8"  (1.727 m)  Wt 165 lb (74.844 kg)  BMI 25.09 kg/m2  SpO2 100%  LMP 02/06/2015 Physical Exam  Constitutional: She is oriented to person, place, and time. She appears well-developed and well-nourished. No distress.  HENT:  Head: Normocephalic and atraumatic.  posterior pharynx normal Uvula midline TMs normal  Eyes: EOM are normal.  Neck: Normal range of motion.  Cardiovascular: Normal rate, regular rhythm and normal heart sounds.   Pulmonary/Chest: Effort normal and breath sounds normal. No respiratory distress. She has no wheezes. She has no rales.  Abdominal: Soft. She exhibits no distension. There is no tenderness.  Musculoskeletal: Normal range of motion.  Neurological: She is alert and oriented to person, place, and time.  Skin: Skin is warm and dry.  Psychiatric: She has a normal mood and affect. Judgment normal.  Nursing note and vitals reviewed.   ED Course  Procedures (including critical care time)  DIAGNOSTIC STUDIES: Oxygen Saturation is 100% on room air, normal by my interpretation.    COORDINATION OF CARE: 11:35 PM Discussed treatment  plan with patient at beside, the patient agrees with the plan and has no further questions at this time.   Labs Review Labs Reviewed - No data to display  Imaging Review No results found.   EKG Interpretation None      MDM   Final diagnoses:  None    Symptoms consistent with upper respiratory tract in infection.  No hypoxia.  Lungs are clear.  No indication for imaging of her chest.  This could represent influenza as she has had some mild myalgias.  She had fever at home.  Patient will be sent home with instructions for symptomatic care.  She is not a candidate for Tamiflu as she is not  pregnant, not elderly, not immunosuppressed.  Patient understands return to the ER for new or worsening symptoms.  Recommended aggressive hydration at home.  Primary care follow-up.   I personally performed the services described in this documentation, which was scribed in my presence. The recorded information has been reviewed and is accurate.      Hoy Morn, MD 02/12/15 [phone]

## 2015-02-11 NOTE — Discharge Instructions (Signed)
Upper Respiratory Infection, Adult An upper respiratory infection (URI) is also sometimes known as the common cold. The upper respiratory tract includes the nose, sinuses, throat, trachea, and bronchi. Bronchi are the airways leading to the lungs. Most people improve within 1 week, but symptoms can last up to 2 weeks. A residual cough may last even longer.  CAUSES Many different viruses can infect the tissues lining the upper respiratory tract. The tissues become irritated and inflamed and often become very moist. Mucus production is also common. A cold is contagious. You can easily spread the virus to others by oral contact. This includes kissing, sharing a glass, coughing, or sneezing. Touching your mouth or nose and then touching a surface, which is then touched by another person, can also spread the virus. SYMPTOMS  Symptoms typically develop 1 to 3 days after you come in contact with a cold virus. Symptoms vary from person to person. They may include:  Runny nose.  Sneezing.  Nasal congestion.  Sinus irritation.  Sore throat.  Loss of voice (laryngitis).  Cough.  Fatigue.  Muscle aches.  Loss of appetite.  Headache.  Low-grade fever. DIAGNOSIS  You might diagnose your own cold based on familiar symptoms, since most people get a cold 2 to 3 times a year. Your caregiver can confirm this based on your exam. Most importantly, your caregiver can check that your symptoms are not due to another disease such as strep throat, sinusitis, pneumonia, asthma, or epiglottitis. Blood tests, throat tests, and X-rays are not necessary to diagnose a common cold, but they may sometimes be helpful in excluding other more serious diseases. Your caregiver will decide if any further tests are required. RISKS AND COMPLICATIONS  You may be at risk for a more severe case of the common cold if you smoke cigarettes, have chronic heart disease (such as heart failure) or lung disease (such as asthma), or if  you have a weakened immune system. The very young and very old are also at risk for more serious infections. Bacterial sinusitis, middle ear infections, and bacterial pneumonia can complicate the common cold. The common cold can worsen asthma and chronic obstructive pulmonary disease (COPD). Sometimes, these complications can require emergency medical care and may be life-threatening. PREVENTION  The best way to protect against getting a cold is to practice good hygiene. Avoid oral or hand contact with people with cold symptoms. Wash your hands often if contact occurs. There is no clear evidence that vitamin C, vitamin E, echinacea, or exercise reduces the chance of developing a cold. However, it is always recommended to get plenty of rest and practice good nutrition. TREATMENT  Treatment is directed at relieving symptoms. There is no cure. Antibiotics are not effective, because the infection is caused by a virus, not by bacteria. Treatment may include:  Increased fluid intake. Sports drinks offer valuable electrolytes, sugars, and fluids.  Breathing heated mist or steam (vaporizer or shower).  Eating chicken soup or other clear broths, and maintaining good nutrition.  Getting plenty of rest.  Using gargles or lozenges for comfort.  Controlling fevers with ibuprofen or acetaminophen as directed by your caregiver.  Increasing usage of your inhaler if you have asthma. Zinc gel and zinc lozenges, taken in the first 24 hours of the common cold, can shorten the duration and lessen the severity of symptoms. Pain medicines may help with fever, muscle aches, and throat pain. A variety of non-prescription medicines are available to treat congestion and runny nose. Your caregiver   can make recommendations and may suggest nasal or lung inhalers for other symptoms.  HOME CARE INSTRUCTIONS   Only take over-the-counter or prescription medicines for pain, discomfort, or fever as directed by your  caregiver.  Use a warm mist humidifier or inhale steam from a shower to increase air moisture. This may keep secretions moist and make it easier to breathe.  Drink enough water and fluids to keep your urine clear or pale yellow.  Rest as needed.  Return to work when your temperature has returned to normal or as your caregiver advises. You may need to stay home longer to avoid infecting others. You can also use a face mask and careful hand washing to prevent spread of the virus. SEEK MEDICAL CARE IF:   After the first few days, you feel you are getting worse rather than better.  You need your caregiver's advice about medicines to control symptoms.  You develop chills, worsening shortness of breath, or brown or red sputum. These may be signs of pneumonia.  You develop yellow or brown nasal discharge or pain in the face, especially when you bend forward. These may be signs of sinusitis.  You develop a fever, swollen neck glands, pain with swallowing, or white areas in the back of your throat. These may be signs of strep throat. SEEK IMMEDIATE MEDICAL CARE IF:   You have a fever.  You develop severe or persistent headache, ear pain, sinus pain, or chest pain.  You develop wheezing, a prolonged cough, cough up blood, or have a change in your usual mucus (if you have chronic lung disease).  You develop sore muscles or a stiff neck. Document Released: 05/20/2001 Document Revised: 02/16/2012 Document Reviewed: 03/01/2014 ExitCare Patient Information 2015 [employer]. This information is not intended to replace advice given to you by your health care provider. Make sure you discuss any questions you have with your health care provider.  

## 2015-02-11 NOTE — ED Notes (Addendum)
Sneezing,sore throat, fever 102, fever relieved with Ibuprofen 800mg  @ 9pm

## 2015-06-26 ENCOUNTER — Emergency Department (HOSPITAL_COMMUNITY)
Admission: EM | Admit: 2015-06-26 | Discharge: 2015-06-27 | Disposition: A | Discharge status: Home / Self Care | Payer: 59 | Attending: Emergency Medicine | Admitting: Emergency Medicine

## 2015-06-26 ENCOUNTER — Encounter (HOSPITAL_COMMUNITY): Payer: Self-pay | Admitting: Emergency Medicine

## 2015-06-26 DIAGNOSIS — Z79899 Other long term (current) drug therapy: Secondary | ICD-10-CM | POA: Insufficient documentation

## 2015-06-26 DIAGNOSIS — Z7982 Long term (current) use of aspirin: Secondary | ICD-10-CM | POA: Insufficient documentation

## 2015-06-26 DIAGNOSIS — R200 Anesthesia of skin: Secondary | ICD-10-CM | POA: Insufficient documentation

## 2015-06-26 DIAGNOSIS — N390 Urinary tract infection, site not specified: Secondary | ICD-10-CM

## 2015-06-26 DIAGNOSIS — Z3202 Encounter for pregnancy test, result negative: Secondary | ICD-10-CM | POA: Insufficient documentation

## 2015-06-26 DIAGNOSIS — Z8673 Personal history of transient ischemic attack (TIA), and cerebral infarction without residual deficits: Secondary | ICD-10-CM | POA: Insufficient documentation

## 2015-06-26 DIAGNOSIS — I10 Essential (primary) hypertension: Secondary | ICD-10-CM | POA: Insufficient documentation

## 2015-06-26 DIAGNOSIS — Z720 Tobacco use: Secondary | ICD-10-CM | POA: Insufficient documentation

## 2015-06-26 MED ORDER — METOPROLOL TARTRATE 25 MG PO TABS
25.0000 mg | ORAL_TABLET | Freq: Once | ORAL | Status: AC
  Administered 2015-06-26: 25 mg via ORAL
  Filled 2015-06-26: qty 1

## 2015-06-26 NOTE — ED Notes (Signed)
Patient complaining of dysuria and hematuria that started yesterday. Reports lower back pain and lower abdominal pain as well.

## 2015-06-27 LAB — URINE MICROSCOPIC-ADD ON

## 2015-06-27 LAB — CBC WITH DIFFERENTIAL/PLATELET
Basophils Absolute: 0 10*3/uL (ref 0.0–0.1)
Basophils Relative: 0 % (ref 0–1)
EOS ABS: 0.1 10*3/uL (ref 0.0–0.7)
Eosinophils Relative: 2 % (ref 0–5)
HCT: 39.1 % (ref 36.0–46.0)
HEMOGLOBIN: 12.9 g/dL (ref 12.0–15.0)
LYMPHS ABS: 3.4 10*3/uL (ref 0.7–4.0)
Lymphocytes Relative: 36 % (ref 12–46)
MCH: 31.7 pg (ref 26.0–34.0)
MCHC: 33 g/dL (ref 30.0–36.0)
MCV: 96.1 fL (ref 78.0–100.0)
Monocytes Absolute: 0.7 10*3/uL (ref 0.1–1.0)
Monocytes Relative: 7 % (ref 3–12)
Neutro Abs: 5.1 10*3/uL (ref 1.7–7.7)
Neutrophils Relative %: 55 % (ref 43–77)
Platelets: 188 10*3/uL (ref 150–400)
RBC: 4.07 MIL/uL (ref 3.87–5.11)
RDW: 13 % (ref 11.5–15.5)
WBC: 9.4 10*3/uL (ref 4.0–10.5)

## 2015-06-27 LAB — URINALYSIS, ROUTINE W REFLEX MICROSCOPIC
Bilirubin Urine: NEGATIVE
Glucose, UA: 100 mg/dL — AB
Ketones, ur: NEGATIVE mg/dL
NITRITE: POSITIVE — AB
PH: 6 (ref 5.0–8.0)
Protein, ur: 100 mg/dL — AB
Specific Gravity, Urine: 1.015 (ref 1.005–1.030)
Urobilinogen, UA: 4 mg/dL — ABNORMAL HIGH (ref 0.0–1.0)

## 2015-06-27 LAB — BASIC METABOLIC PANEL
ANION GAP: 5 (ref 5–15)
BUN: 12 mg/dL (ref 6–20)
CHLORIDE: 106 mmol/L (ref 101–111)
CO2: 26 mmol/L (ref 22–32)
CREATININE: 0.77 mg/dL (ref 0.44–1.00)
Calcium: 8.9 mg/dL (ref 8.9–10.3)
GFR calc Af Amer: >60 mL/min (ref >60)
GLUCOSE: 92 mg/dL (ref 65–99)
Potassium: 3.6 mmol/L (ref 3.5–5.1)
SODIUM: 137 mmol/L (ref 135–145)

## 2015-06-27 LAB — PREGNANCY, URINE: PREG TEST UR: NEGATIVE

## 2015-06-27 MED ORDER — CEPHALEXIN 500 MG PO CAPS
500.0000 mg | ORAL_CAPSULE | Freq: Once | ORAL | Status: AC
  Administered 2015-06-27: 500 mg via ORAL
  Filled 2015-06-27: qty 1

## 2015-06-27 MED ORDER — PHENAZOPYRIDINE HCL 200 MG PO TABS: 200.0000 mg | ORAL_TABLET | Freq: Three times a day (TID) | ORAL | Status: DC

## 2015-06-27 MED ORDER — PHENAZOPYRIDINE HCL 100 MG PO TABS
200.0000 mg | ORAL_TABLET | Freq: Once | ORAL | Status: AC
  Administered 2015-06-27: 200 mg via ORAL
  Filled 2015-06-27: qty 2

## 2015-06-27 MED ORDER — CEPHALEXIN 500 MG PO CAPS: 500.0000 mg | ORAL_CAPSULE | Freq: Four times a day (QID) | ORAL | Status: DC

## 2015-06-27 MED ORDER — HYDROCHLOROTHIAZIDE 25 MG PO TABS: 25.0000 mg | ORAL_TABLET | Freq: Every day | ORAL | Status: DC

## 2015-06-27 NOTE — ED Provider Notes (Signed)
CSN: [id number]     Arrival date & time 06/26/15  2209 History   First MD Initiated Contact with Patient 06/26/15 2249     Chief Complaint  Patient presents with  . Dysuria     (Consider location/radiation/quality/duration/timing/severity/associated sxs/prior Treatment) Patient is a 44 y.o. female presenting with dysuria. The history is provided by the patient.  Dysuria Pain quality:  Aching Pain severity:  Moderate Onset quality:  Gradual Duration:  1 day Timing:  Constant Progression:  Worsening Chronicity:  New Recent urinary tract infections: no   Relieved by:  None tried Exacerbated by: urinating. Ineffective treatments:  None tried Urinary symptoms: foul-smelling urine, frequent urination and hesitancy   Urinary symptoms: no discolored urine, no hematuria and no bladder incontinence   Associated symptoms: abdominal pain   Associated symptoms: no fever, no flank pain, no nausea, no vaginal discharge and no vomiting   Associated symptoms comment:  Describes aching bilateral low back pain and suprapubic tenderness and pressure Risk factors: no hx of pyelonephritis, no hx of urolithiasis, no recurrent urinary tract infections and no renal disease    We also discussed her elevated blood pressure tonight.  She reports she was recently switched by her PCP from lisinopril to metoprolol twice daily.  She has not yet taken her evening dose tonight which she would've normally taken about an hour ago.  She does report that she used to get better control of her blood pressure when she was also on a diaphoretic.  She is currently searching for a new PCP, was seeing Dr. Jeanie Cooks but is currently in between insurance plans and will be establishing primary care here once able.   Past Medical History  Diagnosis Date  . Hypertension   . Stroke    Past Surgical History  Procedure Laterality Date  . Tubal ligation     History reviewed. No pertinent family history. History  Substance Use  Topics  . Smoking status: Current Every Day Smoker -- 0.50 packs/day    Types: Cigarettes  . Smokeless tobacco: Not on file  . Alcohol Use: No   OB History    No data available     Review of Systems  Constitutional: Negative for fever and chills.  HENT: Negative.   Eyes: Negative.   Respiratory: Negative for chest tightness and shortness of breath.   Cardiovascular: Negative for chest pain.  Gastrointestinal: Positive for abdominal pain. Negative for nausea and vomiting.  Genitourinary: Positive for dysuria. Negative for flank pain and vaginal discharge.  Musculoskeletal: Positive for back pain.  Skin: Negative.   Neurological: Positive for numbness. Negative for dizziness, weakness and headaches.  Psychiatric/Behavioral: Negative.       Allergies  Sulfa drugs cross reactors  Home Medications   Prior to Admission medications   Medication Sig Start Date End Date Taking? Authorizing Provider  aspirin EC 81 MG tablet Take 81 mg by mouth daily.    Historical Provider, MD  cephALEXin (KEFLEX) 500 MG capsule Take 1 capsule (500 mg total) by mouth 4 (four) times daily. 06/27/15   Evalee Jefferson, PA-C  hydrochlorothiazide (HYDRODIURIL) 25 MG tablet Take 1 tablet (25 mg total) by mouth daily. 06/27/15   Evalee Jefferson, PA-C  lisinopril (PRINIVIL,ZESTRIL) 5 MG tablet Take 1 tablet (5 mg total) by mouth daily. 04/16/14   [name], MD  loratadine (CLARITIN) 10 MG tablet Take 10 mg by mouth daily as needed for allergies.    Historical Provider, MD  phenazopyridine (PYRIDIUM) 200 MG tablet Take 1  tablet (200 mg total) by mouth 3 (three) times daily. 06/27/15   Evalee Jefferson, PA-C  sodium chloride (OCEAN) 0.65 % nasal spray Place 1 spray into the nose as needed for congestion.    Historical Provider, MD  traMADol (ULTRAM) 50 MG tablet Take 1 tablet (50 mg total) by mouth every 6 (six) hours as needed. 04/16/14   [name], MD   BP 162/102 mmHg  Pulse 66  Temp(Src) 98.2 F (36.8 C) (Oral)   Resp 16  Ht 5\' 9"  (1.753 m)  Wt 145 lb (65.772 kg)  BMI 21.40 kg/m2  SpO2 100%  LMP 06/12/2015 Physical Exam  Constitutional: She appears well-developed and well-nourished.  HENT:  Head: Normocephalic and atraumatic.  Eyes: Conjunctivae are normal.  Neck: Normal range of motion.  Cardiovascular: Normal rate, regular rhythm, normal heart sounds and intact distal pulses.   Pulmonary/Chest: Effort normal and breath sounds normal. She has no wheezes.  Abdominal: Soft. Bowel sounds are normal. She exhibits no distension. There is no tenderness. There is no guarding.  No CVA tenderness.  Musculoskeletal: Normal range of motion.  Neurological: She is alert.  Skin: Skin is warm and dry.  Psychiatric: She has a normal mood and affect.  Nursing note and vitals reviewed.   ED Course  Procedures (including critical care time) Labs Review Labs Reviewed  URINALYSIS, ROUTINE W REFLEX MICROSCOPIC (NOT AT [facility]) - Abnormal; Notable for the following:    Color, Urine ORANGE (*)    APPearance HAZY (*)    Glucose, UA 100 (*)    Hgb urine dipstick LARGE (*)    Protein, ur 100 (*)    Urobilinogen, UA 4.0 (*)    Nitrite POSITIVE (*)    Leukocytes, UA MODERATE (*)    All other components within normal limits  URINE MICROSCOPIC-ADD ON - Abnormal; Notable for the following:    Squamous Epithelial / LPF MANY (*)    Bacteria, UA MANY (*)    All other components within normal limits  URINE CULTURE  PREGNANCY, URINE  CBC WITH DIFFERENTIAL/PLATELET  BASIC METABOLIC PANEL    Imaging Review No results found.   EKG Interpretation None      MDM   Final diagnoses:  UTI (lower urinary tract infection)  Essential hypertension    Patients labs and/or radiological studies were reviewed and considered during the medical decision making and disposition process.  Results were also discussed with patient. Urine culture was ordered.  Patient will be placed on Keflex also given Pyridium for symptom  relief.  First dose given this evening of both medications.  Patient was also given her evening dose of metoprolol.  Will add HCTZ for better blood pressure control.  Plan when necessary follow-up for persistent or worsening symptoms.  She has no complaints of chest pain, shortness of breath, headache this evening.    Evalee Jefferson, PA-C 21/30/86 5784  Delora Fuel, MD 69/62/95 2841

## 2015-06-27 NOTE — Discharge Instructions (Signed)
Hypertension Hypertension, commonly called high blood pressure, is when the force of blood pumping through your arteries is too strong. Your arteries are the blood vessels that carry blood from your heart throughout your body. A blood pressure reading consists of a higher number over a lower number, such as 110/72. The higher number (systolic) is the pressure inside your arteries when your heart pumps. The lower number (diastolic) is the pressure inside your arteries when your heart relaxes. Ideally you want your blood pressure below 120/80. Hypertension forces your heart to work harder to pump blood. Your arteries may become narrow or stiff. Having hypertension puts you at risk for heart disease, stroke, and other problems.  RISK FACTORS Some risk factors for high blood pressure are controllable. Others are not.  Risk factors you cannot control include:   Race. You may be at higher risk if you are African American.  Age. Risk increases with age.  Gender. Men are at higher risk than women before age 45 years. After age 65, women are at higher risk than men. Risk factors you can control include:  Not getting enough exercise or physical activity.  Being overweight.  Getting too much fat, sugar, calories, or salt in your diet.  Drinking too much alcohol. SIGNS AND SYMPTOMS Hypertension does not usually cause signs or symptoms. Extremely high blood pressure (hypertensive crisis) may cause headache, anxiety, shortness of breath, and nosebleed. DIAGNOSIS  To check if you have hypertension, your health care provider will measure your blood pressure while you are seated, with your arm held at the level of your heart. It should be measured at least twice using the same arm. Certain conditions can cause a difference in blood pressure between your right and left arms. A blood pressure reading that is higher than normal on one occasion does not mean that you need treatment. If one blood pressure reading  is high, ask your health care provider about having it checked again. TREATMENT  Treating high blood pressure includes making lifestyle changes and possibly taking medicine. Living a healthy lifestyle can help lower high blood pressure. You may need to change some of your habits. Lifestyle changes may include:  Following the DASH diet. This diet is high in fruits, vegetables, and whole grains. It is low in salt, red meat, and added sugars.  Getting at least 2 hours of brisk physical activity every week.  Losing weight if necessary.  Not smoking.  Limiting alcoholic beverages.  Learning ways to reduce stress. If lifestyle changes are not enough to get your blood pressure under control, your health care provider may prescribe medicine. You may need to take more than one. Work closely with your health care provider to understand the risks and benefits. HOME CARE INSTRUCTIONS  Have your blood pressure rechecked as directed by your health care provider.   Take medicines only as directed by your health care provider. Follow the directions carefully. Blood pressure medicines must be taken as prescribed. The medicine does not work as well when you skip doses. Skipping doses also puts you at risk for problems.   Do not smoke.   Monitor your blood pressure at home as directed by your health care provider. SEEK MEDICAL CARE IF:   You think you are having a reaction to medicines taken.  You have recurrent headaches or feel dizzy.  You have swelling in your ankles.  You have trouble with your vision. SEEK IMMEDIATE MEDICAL CARE IF:  You develop a severe headache or confusion.    You have unusual weakness, numbness, or feel faint.  You have severe chest or abdominal pain.  You vomit repeatedly.  You have trouble breathing. MAKE SURE YOU:   Understand these instructions.  Will watch your condition.  Will get help right away if you are not doing well or get worse. Document  Released: 11/24/2005 Document Revised: 04/10/2014 Document Reviewed: 09/16/2013 ExitCare Patient Information 2015 [employer]. This information is not intended to replace advice given to you by your health care provider. Make sure you discuss any questions you have with your health care provider. Urinary Tract Infection Urinary tract infections (UTIs) can develop anywhere along your urinary tract. Your urinary tract is your body's drainage system for removing wastes and extra water. Your urinary tract includes two kidneys, two ureters, a bladder, and a urethra. Your kidneys are a pair of bean-shaped organs. Each kidney is about the size of your fist. They are located below your ribs, one on each side of your spine. CAUSES Infections are caused by microbes, which are microscopic organisms, including fungi, viruses, and bacteria. These organisms are so small that they can only be seen through a microscope. Bacteria are the microbes that most commonly cause UTIs. SYMPTOMS  Symptoms of UTIs may vary by age and gender of the patient and by the location of the infection. Symptoms in young women typically include a frequent and intense urge to urinate and a painful, burning feeling in the bladder or urethra during urination. Older women and men are more likely to be tired, shaky, and weak and have muscle aches and abdominal pain. A fever may mean the infection is in your kidneys. Other symptoms of a kidney infection include pain in your back or sides below the ribs, nausea, and vomiting. DIAGNOSIS To diagnose a UTI, your caregiver will ask you about your symptoms. Your caregiver also will ask to provide a urine sample. The urine sample will be tested for bacteria and white blood cells. White blood cells are made by your body to help fight infection. TREATMENT  Typically, UTIs can be treated with medication. Because most UTIs are caused by a bacterial infection, they usually can be treated with the use of  antibiotics. The choice of antibiotic and length of treatment depend on your symptoms and the type of bacteria causing your infection. HOME CARE INSTRUCTIONS  If you were prescribed antibiotics, take them exactly as your caregiver instructs you. Finish the medication even if you feel better after you have only taken some of the medication.  Drink enough water and fluids to keep your urine clear or pale yellow.  Avoid caffeine, tea, and carbonated beverages. They tend to irritate your bladder.  Empty your bladder often. Avoid holding urine for long periods of time.  Empty your bladder before and after sexual intercourse.  After a bowel movement, women should cleanse from front to back. Use each tissue only once. SEEK MEDICAL CARE IF:   You have back pain.  You develop a fever.  Your symptoms do not begin to resolve within 3 days. SEEK IMMEDIATE MEDICAL CARE IF:   You have severe back pain or lower abdominal pain.  You develop chills.  You have nausea or vomiting.  You have continued burning or discomfort with urination. MAKE SURE YOU:   Understand these instructions.  Will watch your condition.  Will get help right away if you are not doing well or get worse. Document Released: 09/03/2005 Document Revised: 05/25/2012 Document Reviewed: 01/02/2012 ExitCare Patient Information 2015   [employer]. This information is not intended to replace advice given to you by your health care provider. Make sure you discuss any questions you have with your health care provider.  

## 2015-06-29 LAB — URINE CULTURE: Culture: >=100000

## 2015-07-01 ENCOUNTER — Telehealth (HOSPITAL_COMMUNITY): Payer: Self-pay

## 2015-07-01 NOTE — Telephone Encounter (Signed)
Post ED Visit - Positive Culture Follow-up  Culture report reviewed by antimicrobial stewardship pharmacist: []  Wes Anderson, Pharm.D., BCPS []  Heide Guile, Pharm.D., BCPS [x]  Alycia Rossetti, Pharm.D., BCPS []  [place], Pharm.D., BCPS, AAHIVP []  Legrand Como, Pharm.D., BCPS, AAHIVP []  Isac Sarna, Pharm.D., BCPS  Positive Urine culture, >/= 100,000 colonies -> E Coli Treated with Cephalexin, organism sensitive to the same and no further patient follow-up is required at this time.  Dortha Kern 07/01/2015, 6:27 AM

## 2015-08-27 ENCOUNTER — Encounter (HOSPITAL_COMMUNITY): Payer: Self-pay

## 2015-08-27 ENCOUNTER — Emergency Department (HOSPITAL_COMMUNITY)
Admission: EM | Admit: 2015-08-27 | Discharge: 2015-08-27 | Disposition: A | Discharge status: Home / Self Care | Payer: Self-pay | Attending: Emergency Medicine | Admitting: Emergency Medicine

## 2015-08-27 DIAGNOSIS — Z8673 Personal history of transient ischemic attack (TIA), and cerebral infarction without residual deficits: Secondary | ICD-10-CM | POA: Insufficient documentation

## 2015-08-27 DIAGNOSIS — I10 Essential (primary) hypertension: Secondary | ICD-10-CM | POA: Insufficient documentation

## 2015-08-27 DIAGNOSIS — B349 Viral infection, unspecified: Secondary | ICD-10-CM | POA: Insufficient documentation

## 2015-08-27 DIAGNOSIS — Z79899 Other long term (current) drug therapy: Secondary | ICD-10-CM | POA: Insufficient documentation

## 2015-08-27 DIAGNOSIS — Z720 Tobacco use: Secondary | ICD-10-CM | POA: Insufficient documentation

## 2015-08-27 LAB — CBC WITH DIFFERENTIAL/PLATELET
BASOS ABS: 0 10*3/uL (ref 0.0–0.1)
Basophils Relative: 0 %
EOS PCT: 1 %
Eosinophils Absolute: 0.1 10*3/uL (ref 0.0–0.7)
HCT: 44.4 % (ref 36.0–46.0)
Hemoglobin: 15.1 g/dL — ABNORMAL HIGH (ref 12.0–15.0)
Lymphocytes Relative: 52 %
Lymphs Abs: 4.3 10*3/uL — ABNORMAL HIGH (ref 0.7–4.0)
MCH: 32.8 pg (ref 26.0–34.0)
MCHC: 34 g/dL (ref 30.0–36.0)
MCV: 96.5 fL (ref 78.0–100.0)
Monocytes Absolute: 0.7 10*3/uL (ref 0.1–1.0)
Monocytes Relative: 8 %
Neutro Abs: 3.2 10*3/uL (ref 1.7–7.7)
Neutrophils Relative %: 39 %
PLATELETS: 229 10*3/uL (ref 150–400)
RBC: 4.6 MIL/uL (ref 3.87–5.11)
RDW: 13.3 % (ref 11.5–15.5)
WBC: 8.3 10*3/uL (ref 4.0–10.5)

## 2015-08-27 LAB — COMPREHENSIVE METABOLIC PANEL
ALBUMIN: 4.6 g/dL (ref 3.5–5.0)
ALT: 10 U/L — ABNORMAL LOW (ref 14–54)
AST: 18 U/L (ref 15–41)
Alkaline Phosphatase: 36 U/L — ABNORMAL LOW (ref 38–126)
Anion gap: 6 (ref 5–15)
BUN: 15 mg/dL (ref 6–20)
CHLORIDE: 108 mmol/L (ref 101–111)
CO2: 24 mmol/L (ref 22–32)
Calcium: 9 mg/dL (ref 8.9–10.3)
Creatinine, Ser: 0.78 mg/dL (ref 0.44–1.00)
GFR calc Af Amer: >60 mL/min (ref >60)
GLUCOSE: 98 mg/dL (ref 65–99)
Potassium: 3.8 mmol/L (ref 3.5–5.1)
Sodium: 138 mmol/L (ref 135–145)
Total Bilirubin: 1.3 mg/dL — ABNORMAL HIGH (ref 0.3–1.2)
Total Protein: 8 g/dL (ref 6.5–8.1)

## 2015-08-27 LAB — CBG MONITORING, ED: GLUCOSE-CAPILLARY: 89 mg/dL (ref 65–99)

## 2015-08-27 MED ORDER — SODIUM CHLORIDE 0.9 % IV SOLN
1000.0000 mL | Freq: Once | INTRAVENOUS | Status: AC
  Administered 2015-08-27: 1000 mL via INTRAVENOUS

## 2015-08-27 MED ORDER — ONDANSETRON HCL 4 MG/2ML IJ SOLN
4.0000 mg | Freq: Once | INTRAMUSCULAR | Status: AC
  Administered 2015-08-27: 4 mg via INTRAMUSCULAR
  Filled 2015-08-27: qty 2

## 2015-08-27 MED ORDER — HYDROCHLOROTHIAZIDE 25 MG PO TABS: 25.0000 mg | ORAL_TABLET | Freq: Every day | ORAL | Status: DC

## 2015-08-27 MED ORDER — MECLIZINE HCL 25 MG PO TABS: 25.0000 mg | ORAL_TABLET | Freq: Three times a day (TID) | ORAL | Status: DC | PRN

## 2015-08-27 MED ORDER — ONDANSETRON HCL 4 MG PO TABS: 4.0000 mg | ORAL_TABLET | Freq: Four times a day (QID) | ORAL | Status: DC

## 2015-08-27 NOTE — ED Notes (Signed)
Pt c/o headache, feeling dizzy and jittery.  Reports started vomiting this morning.  Reports bp has been elevated as well.  Pt says has been taking hctz for approx 1 month.

## 2015-08-27 NOTE — Discharge Instructions (Signed)
Your vital signs within normal limits. Your examination favors a viral illness and dehydration. Please increase water, juices, Gatorade sone. Please wash hands frequently. Please rest is much as possible. Use Antivert 3 times daily or every 6 hours for dizziness. This medication will cause drowsiness, so please do not drive, operate machinery, and no legal documents, orthotist fitted any activities requiring concentration when taking this medication. Use Zofran for nausea. Please return to the emergency department or see your primary physician if not improving. Viral Infections A virus is a type of germ. Viruses can cause:  Minor sore throats.  Aches and pains.  Headaches.  Runny nose.  Rashes.  Watery eyes.  Tiredness.  Coughs.  Loss of appetite.  Feeling sick to your stomach (nausea).  Throwing up (vomiting).  Watery poop (diarrhea). HOME CARE   Only take medicines as told by your doctor.  Drink enough water and fluids to keep your pee (urine) clear or pale yellow. Sports drinks are a good choice.  Get plenty of rest and eat healthy. Soups and broths with crackers or rice are fine. GET HELP RIGHT AWAY IF:   You have a very bad headache.  You have shortness of breath.  You have chest pain or neck pain.  You have an unusual rash.  You cannot stop throwing up.  You have watery poop that does not stop.  You cannot keep fluids down.  You or your child has a temperature by mouth above 102 F (38.9 C), not controlled by medicine.  Your baby is older than 3 months with a rectal temperature of 102 F (38.9 C) or higher.  Your baby is 87 months old or younger with a rectal temperature of 100.4 F (38 C) or higher. MAKE SURE YOU:   Understand these instructions.  Will watch this condition.  Will get help right away if you are not doing well or get worse. Document Released: 11/06/2008 Document Revised: 02/16/2012 Document Reviewed: 04/01/2011 [facility] Patient  Information 2015 [place], [place]. This information is not intended to replace advice given to you by your health care provider. Make sure you discuss any questions you have with your health care provider.

## 2015-08-27 NOTE — ED Notes (Signed)
PT c/o generalized weakness, headache, dizziness and jittery x1 day and also reports vomiting x1 today. PT states she had the stomach bug x2 days ago as well.

## 2015-08-27 NOTE — ED Provider Notes (Signed)
CSN: [id number]     Arrival date & time 08/27/15  3419 History   First MD Initiated Contact with Patient 08/27/15 [phone]     Chief Complaint  Patient presents with  . Emesis  . Dizziness     (Consider location/radiation/quality/duration/timing/severity/associated sxs/prior Treatment) HPI Comments: Patient is a 44 year old female who presents to the emergency department with nausea, vomiting, and dizziness.  The patient states that approximately 2 days ago she developed "a stomach bug". She got over this and felt like everything was okay, however she went to work and found that she had weakness, dizziness, feeling jittery all over, and headache. She became concerned because she has hypertension, she was placed on hydrochlorothiazide from the emergency department. She states that she was unable to see her primary physician before the medication ran out because of her insurance issues. She is concerned that some of her symptoms may be related to the fact that she is out of her blood pressure medicine. 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 She denies any high fever. She denies any blood in vomitus or stool. There's been no blood in her urine, and no significant change in urination. She presents now for evaluation of these issues.  Patient is a 44 y.o. female presenting with vomiting and dizziness. The history is provided by the patient.  Emesis Associated symptoms: headaches and myalgias   Dizziness Associated symptoms: headaches, nausea and vomiting     Past Medical History  Diagnosis Date  . Hypertension   . Stroke    Past Surgical History  Procedure Laterality Date  . Tubal ligation     No family history on file. Social History  Substance Use Topics  . Smoking status: Current Every Day Smoker -- 0.50 packs/day    Types: Cigarettes  . Smokeless tobacco: None  . Alcohol Use: No   OB History    No data available     Review of  Systems  Gastrointestinal: Positive for nausea and vomiting.  Musculoskeletal: Positive for myalgias.  Neurological: Positive for dizziness and headaches.  All other systems reviewed and are negative.     Allergies  Sulfa drugs cross reactors  Home Medications   Prior to Admission medications   Medication Sig Start Date End Date Taking? Authorizing Provider  hydrochlorothiazide (HYDRODIURIL) 25 MG tablet Take 1 tablet (25 mg total) by mouth daily. 06/27/15  Yes Almyra Free Idol, PA-C  metoprolol tartrate (LOPRESSOR) 25 MG tablet Take 25 mg by mouth 2 (two) times daily.   Yes Historical Provider, MD  Multiple Vitamins-Minerals (MULTIVITAMIN ADULT PO) Take 1 tablet by mouth daily.   Yes Historical Provider, MD  sodium chloride (OCEAN) 0.65 % nasal spray Place 1 spray into the nose as needed for congestion.   Yes Historical Provider, MD  cephALEXin (KEFLEX) 500 MG capsule Take 1 capsule (500 mg total) by mouth 4 (four) times daily. Patient not taking: Reported on 08/27/2015 06/27/15   Evalee Jefferson, PA-C  lisinopril (PRINIVIL,ZESTRIL) 5 MG tablet Take 1 tablet (5 mg total) by mouth daily. Patient not taking: Reported on 08/27/2015 04/16/14   [name], MD   BP 124/105 mmHg  Pulse 70  Temp(Src) 98.3 F (36.8 C) (Oral)  Resp 12  Ht 5\' 9"  (1.753 m)  Wt 165 lb (74.844 kg)  BMI 24.36 kg/m2  SpO2 99%  LMP 07/31/2015 Physical Exam  Constitutional: She is oriented to person, place, and time. She appears well-developed and well-nourished.  Non-toxic  appearance.  HENT:  Head: Normocephalic.  Right Ear: Tympanic membrane and external ear normal.  Left Ear: Tympanic membrane and external ear normal.  Eyes: EOM and lids are normal. Pupils are equal, round, and reactive to light.  Neck: Normal range of motion. Neck supple. Carotid bruit is not present.  Cardiovascular: Normal rate, regular rhythm, normal heart sounds, intact distal pulses and normal pulses.   Pulmonary/Chest: Breath sounds normal.  No respiratory distress. She has no wheezes.  Abdominal: Soft. Bowel sounds are normal. There is no tenderness. There is no rebound and no guarding.  Musculoskeletal: Normal range of motion.  Lymphadenopathy:       Head (right side): No submandibular adenopathy present.       Head (left side): No submandibular adenopathy present.    She has no cervical adenopathy.  Neurological: She is alert and oriented to person, place, and time. She has normal strength. No cranial nerve deficit or sensory deficit. She exhibits normal muscle tone. Coordination normal.  Gait steady  Skin: Skin is warm and dry.  Psychiatric: She has a normal mood and affect. Her speech is normal.  Nursing note and vitals reviewed.   ED Course  Procedures (including critical care time) Labs Review Labs Reviewed  CBC WITH DIFFERENTIAL/PLATELET - Abnormal; Notable for the following:    Hemoglobin 15.1 (*)    Lymphs Abs 4.3 (*)    All other components within normal limits  COMPREHENSIVE METABOLIC PANEL - Abnormal; Notable for the following:    ALT 10 (*)    Alkaline Phosphatase 36 (*)    Total Bilirubin 1.3 (*)    All other components within normal limits  CBG MONITORING, ED  CBG MONITORING, ED    Imaging Review No results found. I have personally reviewed and evaluated these images and lab results as part of my medical decision-making.   EKG Interpretation   Date/Time:  Monday August 27 2015 09:32:49 EDT Ventricular Rate:  84 PR Interval:  146 QRS Duration: 81 QT Interval:  396 QTC Calculation: 468 R Axis:   43 Text Interpretation:  Sinus rhythm Baseline wander in lead(s) II III aVF  No significant change since last tracing Confirmed by KNAPP  MD-J, JON  ([id number]) on 08/27/2015 9:39:02 AM      MDM  The electrocardiogram shows no acute event at this time. The complete blood count is well within normal limits. The competency metabolic panel shows the alkaline phosphatase to be low at 10, the alkaline  phosphatase to be low at 36, and the total bilirubin to be slightly high at 1.3, otherwise the vital signs are within normal limits.   Patient feels some better after IV fluids and antibiotic medications.  Patient ambulated in the emergency department without problem.  The patient will be discharged home with Antivert to use for dizziness, and Zofran to use for nausea. Work note given for the patient to return on September 21.    Final diagnoses:  None    **I have reviewed nursing notes, vital signs, and all appropriate lab and imaging results for this patient.Lily Kocher, PA-C 08/27/15 1129  Dorie Rank, MD 08/27/15 1131

## 2015-11-28 ENCOUNTER — Encounter (HOSPITAL_COMMUNITY): Payer: Self-pay | Admitting: Emergency Medicine

## 2015-11-28 ENCOUNTER — Emergency Department (HOSPITAL_COMMUNITY): Payer: Self-pay

## 2015-11-28 ENCOUNTER — Emergency Department (HOSPITAL_COMMUNITY)
Admission: EM | Admit: 2015-11-28 | Discharge: 2015-11-29 | Disposition: A | Discharge status: Home / Self Care | Payer: Self-pay | Attending: Emergency Medicine | Admitting: Emergency Medicine

## 2015-11-28 DIAGNOSIS — Z8673 Personal history of transient ischemic attack (TIA), and cerebral infarction without residual deficits: Secondary | ICD-10-CM | POA: Insufficient documentation

## 2015-11-28 DIAGNOSIS — Z79899 Other long term (current) drug therapy: Secondary | ICD-10-CM | POA: Insufficient documentation

## 2015-11-28 DIAGNOSIS — I10 Essential (primary) hypertension: Secondary | ICD-10-CM | POA: Insufficient documentation

## 2015-11-28 DIAGNOSIS — N200 Calculus of kidney: Secondary | ICD-10-CM | POA: Insufficient documentation

## 2015-11-28 DIAGNOSIS — F17210 Nicotine dependence, cigarettes, uncomplicated: Secondary | ICD-10-CM | POA: Insufficient documentation

## 2015-11-28 DIAGNOSIS — R319 Hematuria, unspecified: Secondary | ICD-10-CM | POA: Insufficient documentation

## 2015-11-28 DIAGNOSIS — R6883 Chills (without fever): Secondary | ICD-10-CM | POA: Insufficient documentation

## 2015-11-28 DIAGNOSIS — Z3202 Encounter for pregnancy test, result negative: Secondary | ICD-10-CM | POA: Insufficient documentation

## 2015-11-28 LAB — COMPREHENSIVE METABOLIC PANEL
ALT: 8 U/L — ABNORMAL LOW (ref 14–54)
ANION GAP: 7 (ref 5–15)
AST: 19 U/L (ref 15–41)
Albumin: 4.4 g/dL (ref 3.5–5.0)
Alkaline Phosphatase: 38 U/L (ref 38–126)
BUN: 10 mg/dL (ref 6–20)
CHLORIDE: 104 mmol/L (ref 101–111)
CO2: 29 mmol/L (ref 22–32)
Calcium: 9.3 mg/dL (ref 8.9–10.3)
Creatinine, Ser: 1.02 mg/dL — ABNORMAL HIGH (ref 0.44–1.00)
Glucose, Bld: 108 mg/dL — ABNORMAL HIGH (ref 65–99)
POTASSIUM: 3.7 mmol/L (ref 3.5–5.1)
Sodium: 140 mmol/L (ref 135–145)
Total Bilirubin: 0.6 mg/dL (ref 0.3–1.2)
Total Protein: 7.4 g/dL (ref 6.5–8.1)

## 2015-11-28 LAB — CBC
HEMATOCRIT: 40.5 % (ref 36.0–46.0)
HEMOGLOBIN: 13.9 g/dL (ref 12.0–15.0)
MCH: 32.9 pg (ref 26.0–34.0)
MCHC: 34.3 g/dL (ref 30.0–36.0)
MCV: 95.7 fL (ref 78.0–100.0)
Platelets: 215 10*3/uL (ref 150–400)
RBC: 4.23 MIL/uL (ref 3.87–5.11)
RDW: 12.8 % (ref 11.5–15.5)
WBC: 6.8 10*3/uL (ref 4.0–10.5)

## 2015-11-28 LAB — URINALYSIS, ROUTINE W REFLEX MICROSCOPIC
Bilirubin Urine: NEGATIVE
GLUCOSE, UA: NEGATIVE mg/dL
Ketones, ur: NEGATIVE mg/dL
Leukocytes, UA: NEGATIVE
Nitrite: NEGATIVE
Protein, ur: NEGATIVE mg/dL
SPECIFIC GRAVITY, URINE: 1.015 (ref 1.005–1.030)
pH: 6.5 (ref 5.0–8.0)

## 2015-11-28 LAB — LIPASE, BLOOD: Lipase: 35 U/L (ref 11–51)

## 2015-11-28 LAB — PREGNANCY, URINE: Preg Test, Ur: NEGATIVE

## 2015-11-28 LAB — URINE MICROSCOPIC-ADD ON

## 2015-11-28 MED ORDER — ONDANSETRON HCL 4 MG/2ML IJ SOLN
4.0000 mg | Freq: Once | INTRAMUSCULAR | Status: AC
  Administered 2015-11-29: 4 mg via INTRAVENOUS
  Filled 2015-11-28: qty 2

## 2015-11-28 MED ORDER — KETOROLAC TROMETHAMINE 30 MG/ML IJ SOLN
30.0000 mg | Freq: Once | INTRAMUSCULAR | Status: AC
  Administered 2015-11-28: 30 mg via INTRAVENOUS
  Filled 2015-11-28: qty 1

## 2015-11-28 MED ORDER — PROMETHAZINE HCL 25 MG/ML IJ SOLN
12.5000 mg | Freq: Once | INTRAMUSCULAR | Status: AC
  Administered 2015-11-28: 12.5 mg via INTRAVENOUS
  Filled 2015-11-28: qty 1

## 2015-11-28 MED ORDER — SODIUM CHLORIDE 0.9 % IV SOLN
INTRAVENOUS | Status: AC
  Administered 2015-11-28: 23:00:00 via INTRAVENOUS

## 2015-11-28 NOTE — ED Provider Notes (Signed)
CSN: [id number]     Arrival date & time 11/28/15  2134 History   First MD Initiated Contact with Patient 11/28/15 2209     Chief Complaint  Patient presents with  . Abdominal Pain     (Consider location/radiation/quality/duration/timing/severity/associated sxs/prior Treatment) Patient is a 44 y.o. female presenting with abdominal pain. The history is provided by the patient.  Abdominal Pain Pain location:  RLQ, LLQ and suprapubic Pain quality: burning and stabbing   Pain radiates to:  Back Pain severity:  Moderate Onset quality:  Gradual Duration:  3 hours Timing:  Constant Progression:  Worsening Chronicity:  New Associated symptoms: chills and dysuria   Associated symptoms: no vaginal bleeding and no vaginal discharge    LOLLY PELOWSKI is a 44 y.o. G7 P5 with BTL for birth control presents to the ED with UTI symptoms and back pain. She is unsure if the back pain is related to the urinary symptoms or if she did something at work today to [place] her back. The back pain is located in the right flank area.   Past Medical History  Diagnosis Date  . Hypertension   . Stroke [facility])    Past Surgical History  Procedure Laterality Date  . Tubal ligation     History reviewed. No pertinent family history. Social History  Substance Use Topics  . Smoking status: Current Every Day Smoker -- 0.50 packs/day    Types: Cigarettes  . Smokeless tobacco: None  . Alcohol Use: No   OB History    No data available     Review of Systems  Constitutional: Positive for chills.  Gastrointestinal: Positive for abdominal pain.  Genitourinary: Positive for dysuria, urgency and frequency. Negative for vaginal bleeding and vaginal discharge.  Musculoskeletal: Positive for back pain.  all other systems negative    Allergies  Sulfa drugs cross reactors  Home Medications   Prior to Admission medications   Medication Sig Start Date End Date Taking? Authorizing Provider  cephALEXin (KEFLEX)  500 MG capsule Take 1 capsule (500 mg total) by mouth 4 (four) times daily. Patient not taking: Reported on 08/27/2015 06/27/15   Evalee Jefferson, PA-C  hydrochlorothiazide (HYDRODIURIL) 25 MG tablet Take 1 tablet (25 mg total) by mouth daily. 08/27/15   Lily Kocher, PA-C  lisinopril (PRINIVIL,ZESTRIL) 5 MG tablet Take 1 tablet (5 mg total) by mouth daily. Patient not taking: Reported on 08/27/2015 04/16/14   [name], MD  meclizine (ANTIVERT) 25 MG tablet Take 1 tablet (25 mg total) by mouth 3 (three) times daily as needed for dizziness. 08/27/15   Lily Kocher, PA-C  metoprolol tartrate (LOPRESSOR) 25 MG tablet Take 25 mg by mouth 2 (two) times daily.    Historical Provider, MD  Multiple Vitamins-Minerals (MULTIVITAMIN ADULT PO) Take 1 tablet by mouth daily.    Historical Provider, MD  ondansetron (ZOFRAN) 4 MG tablet Take 1 tablet (4 mg total) by mouth every 6 (six) hours. 08/27/15   Lily Kocher, PA-C  sodium chloride (OCEAN) 0.65 % nasal spray Place 1 spray into the nose as needed for congestion.    Historical Provider, MD   BP 152/83 mmHg  Pulse 78  Temp(Src) 97.8 F (36.6 C) (Oral)  Resp 18  Ht 5\' 9"  (1.753 m)  Wt 74.844 kg  BMI 24.36 kg/m2  SpO2 100%  LMP 11/08/2015 Physical Exam  Constitutional: She is oriented to person, place, and time. She appears well-developed and well-nourished. No distress.  HENT:  Head: Normocephalic and atraumatic.  Eyes:  EOM are normal.  Neck: Normal range of motion. Neck supple.  Cardiovascular: Normal rate and regular rhythm.   Pulmonary/Chest: Effort normal and breath sounds normal.  Abdominal: Soft. Bowel sounds are normal. There is tenderness in the right lower quadrant and suprapubic area. There is no rigidity, no rebound and no guarding.  Pain radiates to the right flank area.   Musculoskeletal: Normal range of motion.  Neurological: She is alert and oriented to person, place, and time. No cranial nerve deficit.  Skin: Skin is warm and dry.   Psychiatric: She has a normal mood and affect. Her behavior is normal.  Nursing note and vitals reviewed.   ED Course  Procedures (including critical care time) Labs, CT renal, IV NS, Phenergan 12.5 mg, Toradol 30 mg IV Dilaudid 1 mg IV, Flomax 0.4 mg PO  Labs Review Results for orders placed or performed during the hospital encounter of 11/28/15 (from the past 24 hour(s))  Comprehensive metabolic panel     Status: Abnormal   Collection Time: 11/28/15 10:03 PM  Result Value Ref Range   Sodium 140 135 - 145 mmol/L   Potassium 3.7 3.5 - 5.1 mmol/L   Chloride 104 101 - 111 mmol/L   CO2 29 22 - 32 mmol/L   Glucose, Bld 108 (H) 65 - 99 mg/dL   BUN 10 6 - 20 mg/dL   Creatinine, Ser 1.02 (H) 0.44 - 1.00 mg/dL   Calcium 9.3 8.9 - 10.3 mg/dL   Total Protein 7.4 6.5 - 8.1 g/dL   Albumin 4.4 3.5 - 5.0 g/dL   AST 19 15 - 41 U/L   ALT 8 (L) 14 - 54 U/L   Alkaline Phosphatase 38 38 - 126 U/L   Total Bilirubin 0.6 0.3 - 1.2 mg/dL   GFR calc non Af Amer >60 >60 mL/min   GFR calc Af Amer >60 >60 mL/min   Anion gap 7 5 - 15  CBC     Status: None   Collection Time: 11/28/15 10:03 PM  Result Value Ref Range   WBC 6.8 4.0 - 10.5 K/uL   RBC 4.23 3.87 - 5.11 MIL/uL   Hemoglobin 13.9 12.0 - 15.0 g/dL   HCT 40.5 36.0 - 46.0 %   MCV 95.7 78.0 - 100.0 fL   MCH 32.9 26.0 - 34.0 pg   MCHC 34.3 30.0 - 36.0 g/dL   RDW 12.8 11.5 - 15.5 %   Platelets 215 150 - 400 K/uL  Lipase, blood     Status: None   Collection Time: 11/28/15 10:03 PM  Result Value Ref Range   Lipase 35 11 - 51 U/L  Urinalysis, Routine w reflex microscopic (not at [facility])     Status: Abnormal   Collection Time: 11/28/15 10:14 PM  Result Value Ref Range   Color, Urine YELLOW YELLOW   APPearance CLEAR CLEAR   Specific Gravity, Urine 1.015 1.005 - 1.030   pH 6.5 5.0 - 8.0   Glucose, UA NEGATIVE NEGATIVE mg/dL   Hgb urine dipstick LARGE (A) NEGATIVE   Bilirubin Urine NEGATIVE NEGATIVE   Ketones, ur NEGATIVE NEGATIVE mg/dL    Protein, ur NEGATIVE NEGATIVE mg/dL   Nitrite NEGATIVE NEGATIVE   Leukocytes, UA NEGATIVE NEGATIVE  Urine microscopic-add on     Status: Abnormal   Collection Time: 11/28/15 10:14 PM  Result Value Ref Range   Squamous Epithelial / LPF TOO NUMEROUS TO COUNT (A) NONE SEEN   WBC, UA 0-5 0 - 5 WBC/hpf   RBC /  HPF 6-30 0 - 5 RBC/hpf   Bacteria, UA MANY (A) NONE SEEN  Pregnancy, urine     Status: None   Collection Time: 11/28/15 10:14 PM  Result Value Ref Range   Preg Test, Ur NEGATIVE NEGATIVE     Imaging Review Ct Renal Stone Study  11/29/2015  CLINICAL DATA:  Initial evaluation for acute right-sided flank pain, microhematuria. EXAM: CT ABDOMEN AND PELVIS WITHOUT CONTRAST TECHNIQUE: Multidetector CT imaging of the abdomen and pelvis was performed following the standard protocol without IV contrast. COMPARISON:  Prior study from 04/27/2009. FINDINGS: Mild subsegmental atelectasis seen dependently within the visualize right lung base. Visualized lungs are otherwise clear. 6 mm hypodensity noted within the left hepatic lobe, indeterminate. Limited noncontrast evaluation of the liver is otherwise unremarkable. Gallbladder normal. No biliary dilatation. Spleen, adrenal glands, and pancreas demonstrate a normal unenhanced appearance. Mild right hydroureteronephrosis. There is an obstructive 5 mm stone at the right UVJ. No other radiopaque calculi identified along the course of the right ureter. No other stones seen within the right kidney itself. Subcentimeter hypodensity noted within inferior right kidney, too small the characterize, but statistically likely a small cyst. Probable 11 mm exophytic cyst extending from the interpolar right kidney as well. On the left, there is a nonobstructive 7 mm stone within the upper pole left kidney. No radiopaque calculi seen along the course of the left ureter. No left-sided hydroureter or hydronephrosis. 16 mm cyst noted within the inferior pole left kidney. Small  hiatal hernia noted. Stomach otherwise unremarkable. No evidence for bowel obstruction. No acute inflammatory changes about the bowels. No evidence for acute appendicitis. Colonic diverticulosis without acute diverticulitis. Bladder is largely decompressed but grossly normal. Uterus within normal limits. Ovoid partially hyperdense lesion within the right adnexa noted, stable, and of doubtful clinical significance. Left ovary within normal limits. No free air or fluid. No pathologically enlarged intra-abdominal or pelvic lymph nodes identified. Minimal atheromatous plaque within the infrarenal aorta up. No acute osseous abnormality. No worrisome lytic or blastic osseous lesions. Degenerative disc desiccation with disc bulge present at L5-S1. IMPRESSION: 1. 5 mm obstructive stone at the right UVJ with secondary mild right hydroureteronephrosis. 2. Additional nonobstructive left-sided nephrolithiasis as above. 3. No other acute intra-abdominal or pelvic process. 4. Colonic diverticulosis without acute diverticulitis. Electronically Signed   By: Jeannine Boga M.D.   On: 11/29/2015 00:37    MDM  44 y.o. female with right flank pain, lower abdominal pain and hematuria stable for d/c without fever and does not appear toxic. Will continue treatment for pain and she will call urology later today for follow up. Discussed with the patient and all questioned fully answered. She will return here for any problems.   Final diagnoses:  Hematuria  Kidney stone       [facility], NP [id number]  Delora Fuel, MD [id number]

## 2015-11-28 NOTE — ED Notes (Signed)
Pain to abdomen and back, stated it started 3 hours ago. Stated she took cranberry pills because she thought it was a UTI, but "they only made it worse". Pt states she strains when she pees and it burns.

## 2015-11-29 MED ORDER — TAMSULOSIN HCL 0.4 MG PO CAPS: 0.4000 mg | ORAL_CAPSULE | Freq: Every day | ORAL | Status: DC

## 2015-11-29 MED ORDER — OXYCODONE-ACETAMINOPHEN 5-325 MG PO TABS: 2.0000 | ORAL_TABLET | ORAL | Status: DC | PRN

## 2015-11-29 MED ORDER — PROMETHAZINE HCL 25 MG PO TABS: 25.0000 mg | ORAL_TABLET | Freq: Four times a day (QID) | ORAL | Status: DC | PRN

## 2015-11-29 MED ORDER — OXYCODONE-ACETAMINOPHEN 5-325 MG PO TABS: 1.0000 | ORAL_TABLET | ORAL | Status: DC | PRN

## 2015-11-29 MED ORDER — HYDROMORPHONE HCL 1 MG/ML IJ SOLN
1.0000 mg | Freq: Once | INTRAMUSCULAR | Status: AC
  Administered 2015-11-29: 1 mg via INTRAVENOUS
  Filled 2015-11-29: qty 1

## 2015-11-29 MED ORDER — TAMSULOSIN HCL 0.4 MG PO CAPS
0.4000 mg | ORAL_CAPSULE | Freq: Once | ORAL | Status: AC
  Administered 2015-11-29: 0.4 mg via ORAL
  Filled 2015-11-29: qty 1

## 2015-11-29 MED ORDER — ONDANSETRON 4 MG PREPACK (~~LOC~~): ORAL_TABLET | ORAL | Status: DC

## 2015-11-29 NOTE — Discharge Instructions (Signed)
When you call the urologist office ask for an appointment in the [place] office.

## 2016-04-03 ENCOUNTER — Other Ambulatory Visit (HOSPITAL_COMMUNITY): Payer: Self-pay | Admitting: Urology

## 2016-04-03 DIAGNOSIS — D49511 Neoplasm of unspecified behavior of right kidney: Secondary | ICD-10-CM

## 2016-04-07 ENCOUNTER — Ambulatory Visit (HOSPITAL_COMMUNITY)
Admission: RE | Admit: 2016-04-07 | Discharge: 2016-04-07 | Disposition: A | Discharge status: Home / Self Care | Payer: 59 | Source: Ambulatory Visit | Attending: Urology | Admitting: Urology

## 2016-04-07 DIAGNOSIS — D49511 Neoplasm of unspecified behavior of right kidney: Secondary | ICD-10-CM

## 2016-04-07 DIAGNOSIS — N281 Cyst of kidney, acquired: Secondary | ICD-10-CM | POA: Insufficient documentation

## 2016-09-09 ENCOUNTER — Emergency Department (HOSPITAL_COMMUNITY)
Admission: EM | Admit: 2016-09-09 | Discharge: 2016-09-09 | Disposition: A | Discharge status: Home / Self Care | Payer: 59 | Attending: Emergency Medicine | Admitting: Emergency Medicine

## 2016-09-09 ENCOUNTER — Emergency Department (HOSPITAL_COMMUNITY): Payer: 59

## 2016-09-09 ENCOUNTER — Encounter (HOSPITAL_COMMUNITY): Payer: Self-pay

## 2016-09-09 DIAGNOSIS — Z8673 Personal history of transient ischemic attack (TIA), and cerebral infarction without residual deficits: Secondary | ICD-10-CM | POA: Diagnosis not present

## 2016-09-09 DIAGNOSIS — F17210 Nicotine dependence, cigarettes, uncomplicated: Secondary | ICD-10-CM | POA: Diagnosis not present

## 2016-09-09 DIAGNOSIS — N3000 Acute cystitis without hematuria: Secondary | ICD-10-CM | POA: Diagnosis not present

## 2016-09-09 DIAGNOSIS — I10 Essential (primary) hypertension: Secondary | ICD-10-CM | POA: Insufficient documentation

## 2016-09-09 DIAGNOSIS — R1013 Epigastric pain: Secondary | ICD-10-CM | POA: Diagnosis present

## 2016-09-09 DIAGNOSIS — R52 Pain, unspecified: Secondary | ICD-10-CM

## 2016-09-09 LAB — CBC
HEMATOCRIT: 36.9 % (ref 36.0–46.0)
Hemoglobin: 12 g/dL (ref 12.0–15.0)
MCH: 32.3 pg (ref 26.0–34.0)
MCHC: 32.5 g/dL (ref 30.0–36.0)
MCV: 99.2 fL (ref 78.0–100.0)
Platelets: 220 10*3/uL (ref 150–400)
RBC: 3.72 MIL/uL — ABNORMAL LOW (ref 3.87–5.11)
RDW: 13.2 % (ref 11.5–15.5)
WBC: 7.6 10*3/uL (ref 4.0–10.5)

## 2016-09-09 LAB — COMPREHENSIVE METABOLIC PANEL
ALBUMIN: 3.6 g/dL (ref 3.5–5.0)
ALK PHOS: 31 U/L — AB (ref 38–126)
ALT: 11 U/L — ABNORMAL LOW (ref 14–54)
ANION GAP: 6 (ref 5–15)
AST: 18 U/L (ref 15–41)
BILIRUBIN TOTAL: 0.3 mg/dL (ref 0.3–1.2)
BUN: 10 mg/dL (ref 6–20)
CALCIUM: 8.8 mg/dL — AB (ref 8.9–10.3)
CO2: 28 mmol/L (ref 22–32)
Chloride: 105 mmol/L (ref 101–111)
Creatinine, Ser: 0.83 mg/dL (ref 0.44–1.00)
GFR calc Af Amer: >60 mL/min (ref >60)
GLUCOSE: 96 mg/dL (ref 65–99)
POTASSIUM: 3.4 mmol/L — AB (ref 3.5–5.1)
Sodium: 139 mmol/L (ref 135–145)
TOTAL PROTEIN: 6.2 g/dL — AB (ref 6.5–8.1)

## 2016-09-09 LAB — URINALYSIS, ROUTINE W REFLEX MICROSCOPIC
BILIRUBIN URINE: NEGATIVE
GLUCOSE, UA: NEGATIVE mg/dL
Ketones, ur: NEGATIVE mg/dL
Nitrite: NEGATIVE
Protein, ur: 30 mg/dL — AB
SPECIFIC GRAVITY, URINE: 1.03 (ref 1.005–1.030)
pH: 6.5 (ref 5.0–8.0)

## 2016-09-09 LAB — LIPASE, BLOOD: Lipase: 32 U/L (ref 11–51)

## 2016-09-09 LAB — URINE MICROSCOPIC-ADD ON

## 2016-09-09 LAB — PROTIME-INR
INR: 0.94
Prothrombin Time: 12.6 seconds (ref 11.4–15.2)

## 2016-09-09 LAB — TROPONIN I

## 2016-09-09 MED ORDER — CEPHALEXIN 250 MG PO CAPS
500.0000 mg | ORAL_CAPSULE | Freq: Once | ORAL | Status: AC
  Administered 2016-09-09: 500 mg via ORAL
  Filled 2016-09-09: qty 2

## 2016-09-09 MED ORDER — CEPHALEXIN 500 MG PO CAPS: 500.0000 mg | ORAL_CAPSULE | Freq: Four times a day (QID) | ORAL | 0 refills | Status: DC

## 2016-09-09 MED ORDER — FAMOTIDINE 20 MG PO TABS: 20.0000 mg | ORAL_TABLET | Freq: Two times a day (BID) | ORAL | 0 refills | Status: DC

## 2016-09-09 NOTE — ED Notes (Signed)
Pt d/c home  

## 2016-09-09 NOTE — ED Notes (Signed)
Pt to US.

## 2016-09-09 NOTE — Discharge Instructions (Signed)
Avoid fatty and greasy foods. 

## 2016-09-09 NOTE — ED Provider Notes (Signed)
[facility] DEPT Provider Note   CSN: [id number] Arrival date & time: 09/09/16  1831     History   Chief Complaint Chief Complaint  Patient presents with  . Chest Pain    HPI Debra Wade is a 45 y.o. female.  Pt presents to the ED with epigastric abdominal pain.  She said that she thought it was indigestion, but she drank a coke and took an [employer] and it did not help.  The pt said that she ate [place] shrimp (fried) for lunch which made her sx worse.  The pt said that she is pain free now.      Past Medical History:  Diagnosis Date  . Hypertension   . Stroke [facility])     There are no active problems to display for this patient.   Past Surgical History:  Procedure Laterality Date  . TUBAL LIGATION      OB History    No data available       Home Medications    Prior to Admission medications   Medication Sig Start Date End Date Taking? Authorizing Provider  losartan-hydrochlorothiazide (HYZAAR) 100-25 MG tablet Take 1 tablet by mouth daily.   Yes Historical Provider, MD  metoprolol tartrate (LOPRESSOR) 25 MG tablet Take 25 mg by mouth 2 (two) times daily.   Yes Historical Provider, MD  cephALEXin (KEFLEX) 500 MG capsule Take 1 capsule (500 mg total) by mouth 4 (four) times daily. Patient not taking: Reported on 09/09/2016 06/27/15   Evalee Jefferson, PA-C  famotidine (PEPCID) 20 MG tablet Take 1 tablet (20 mg total) by mouth 2 (two) times daily. 09/09/16   Isla Pence, MD  hydrochlorothiazide (HYDRODIURIL) 25 MG tablet Take 1 tablet (25 mg total) by mouth daily. Patient not taking: Reported on 09/09/2016 08/27/15   Lily Kocher, PA-C  lisinopril (PRINIVIL,ZESTRIL) 5 MG tablet Take 1 tablet (5 mg total) by mouth daily. Patient not taking: Reported on 09/09/2016 04/16/14   [name], MD  meclizine (ANTIVERT) 25 MG tablet Take 1 tablet (25 mg total) by mouth 3 (three) times daily as needed for dizziness. Patient not taking: Reported on 09/09/2016 08/27/15    Lily Kocher, PA-C  ondansetron [facility]) 4 mg TABS tablet Take one tablet every 8 hours as needed for nausea Patient not taking: Reported on 09/09/2016 11/29/15   Ashley Murrain, NP  oxyCODONE-acetaminophen (PERCOCET/ROXICET) 5-325 MG tablet Take 2 tablets by mouth every 4 (four) hours as needed for severe pain. Patient not taking: Reported on 09/09/2016 11/29/15   Ashley Murrain, NP  oxyCODONE-acetaminophen (ROXICET) 5-325 MG tablet Take 1 tablet by mouth every 4 (four) hours as needed for severe pain. Patient not taking: Reported on 09/09/2016 11/29/15   Ashley Murrain, NP  promethazine (PHENERGAN) 25 MG tablet Take 1 tablet (25 mg total) by mouth every 6 (six) hours as needed for nausea or vomiting. Patient not taking: Reported on 09/09/2016 11/29/15   Ashley Murrain, NP  tamsulosin (FLOMAX) 0.4 MG CAPS capsule Take 1 capsule (0.4 mg total) by mouth daily. Patient not taking: Reported on 09/09/2016 11/29/15   Ashley Murrain, NP    Family History No family history on file.  Social History Social History  Substance Use Topics  . Smoking status: Current Every Day Smoker    Packs/day: 0.50    Types: Cigarettes  . Smokeless tobacco: Never Used  . Alcohol use No     Allergies   Sulfa drugs cross reactors   Review of  Systems Review of Systems  Gastrointestinal: Positive for abdominal pain.  All other systems reviewed and are negative.    Physical Exam Updated Vital Signs BP 124/89 (BP Location: Right Arm)   Pulse (!) 56   Temp 98.4 F (36.9 C) (Oral)   Resp 25   SpO2 100%   Physical Exam  Constitutional: She is oriented to person, place, and time. She appears well-developed and well-nourished.  HENT:  Head: Normocephalic and atraumatic.  Right Ear: External ear normal.  Left Ear: External ear normal.  Nose: Nose normal.  Mouth/Throat: Oropharynx is clear and moist.  Eyes: Conjunctivae and EOM are normal. Pupils are equal, round, and reactive to light.  Neck: Normal range of  motion. Neck supple.  Cardiovascular: Normal rate, regular rhythm, normal heart sounds and intact distal pulses.   Pulmonary/Chest: Effort normal and breath sounds normal.  Abdominal: Soft. Bowel sounds are normal.  Musculoskeletal: Normal range of motion.  Neurological: She is alert and oriented to person, place, and time.  Skin: Skin is warm and dry.  Psychiatric: She has a normal mood and affect. Her behavior is normal. Judgment and thought content normal.  Nursing note and vitals reviewed.    ED Treatments / Results  Labs (all labs ordered are listed, but only abnormal results are displayed) Labs Reviewed  CBC - Abnormal; Notable for the following:       Result Value   RBC 3.72 (*)    All other components within normal limits  COMPREHENSIVE METABOLIC PANEL - Abnormal; Notable for the following:    Potassium 3.4 (*)    Calcium 8.8 (*)    Total Protein 6.2 (*)    ALT 11 (*)    Alkaline Phosphatase 31 (*)    All other components within normal limits  URINALYSIS, ROUTINE W REFLEX MICROSCOPIC (NOT AT [facility]) - Abnormal; Notable for the following:    APPearance CLOUDY (*)    Hgb urine dipstick LARGE (*)    Protein, ur 30 (*)    Leukocytes, UA SMALL (*)    All other components within normal limits  URINE MICROSCOPIC-ADD ON - Abnormal; Notable for the following:    Squamous Epithelial / LPF 6-30 (*)    Bacteria, UA MANY (*)    All other components within normal limits  TROPONIN I  PROTIME-INR  LIPASE, BLOOD    EKG  EKG Interpretation  Date/Time:  Tuesday September 09 2016 18:33:08 EDT Ventricular Rate:  57 PR Interval:    QRS Duration: 82 QT Interval:  424 QTC Calculation: 413 R Axis:   36 Text Interpretation:  Sinus arrhythmia Confirmed by Gilford Raid MD, Agam Tuohy ([id number]) on 09/09/2016 6:52:33 PM       Radiology Dg Chest 2 View  Result Date: 09/09/2016 CLINICAL DATA:  Central chest pain.  Shortness of breath. EXAM: CHEST  2 VIEW COMPARISON:  04/16/2014 FINDINGS: Both  lungs are clear. Heart and mediastinum are within normal limits and stable. The trachea is midline. No pleural effusions. No acute bone abnormality. IMPRESSION: No active cardiopulmonary disease. Electronically Signed   By: Markus Daft M.D.   On: 09/09/2016 19:21   US Abdomen Limited Ruq  Result Date: 09/09/2016 CLINICAL DATA:  45 y/o  F; right upper quadrant pain. EXAM: US ABDOMEN LIMITED - RIGHT UPPER QUADRANT COMPARISON:  04/02/2016 CT of abdomen and pelvis were FINDINGS: Gallbladder: No gallstones or wall thickening visualized. No sonographic Murphy sign noted by sonographer. Common bile duct: Diameter: 4.1 mm Liver: No focal lesion identified. Within  normal limits in parenchymal echogenicity. IMPRESSION: Normal right upper quadrant ultrasound. Electronically Signed   By: Kristine Garbe M.D.   On: 09/09/2016 20:46    Procedures Procedures (including critical care time)  Medications Ordered in ED Medications - No data to display   Initial Impression / Assessment and Plan / ED Course  I have reviewed the triage vital signs and the nursing notes.  Pertinent labs & imaging results that were available during my care of the patient were reviewed by me and considered in my medical decision making (see chart for details).  Clinical Course   Pt is feeling better.  She is instructed to avoid fatty and greasy foods.  She is told that if she continues to have pain, then she may need a HIDA scan.  She is given the number to GI.  She knows to return if worse.  Final Clinical Impressions(s) / ED Diagnoses   Final diagnoses:  Pain  Epigastric abdominal pain    New Prescriptions New Prescriptions   FAMOTIDINE (PEPCID) 20 MG TABLET    Take 1 tablet (20 mg total) by mouth 2 (two) times daily.     Isla Pence, MD 09/09/16 2134

## 2016-09-09 NOTE — ED Triage Notes (Signed)
Pt brought in by EMS from work where she had a sudden onset of CP around noon that has continued to bother her intermittently. When the chest pain comes it is centralized and associated with shortness of breath. Pt hx of htn, denies any further cardiac hx. Pt received 324 ASA PTA. Pt attempted to take AlkaSeltzer at work to try and alleviate symptoms with no relief. Pt skin warm, pink, dry. Pt RR even, unlabored at this time. LH 20 G peripheral IV initiated by EMS.

## 2017-03-19 ENCOUNTER — Encounter (HOSPITAL_COMMUNITY): Payer: Self-pay | Admitting: Vascular Surgery

## 2017-03-19 ENCOUNTER — Emergency Department (HOSPITAL_COMMUNITY): Payer: 59

## 2017-03-19 ENCOUNTER — Emergency Department (HOSPITAL_COMMUNITY)
Admission: EM | Admit: 2017-03-19 | Discharge: 2017-03-19 | Disposition: A | Discharge status: Home / Self Care | Payer: 59 | Attending: Emergency Medicine | Admitting: Emergency Medicine

## 2017-03-19 DIAGNOSIS — S8991XA Unspecified injury of right lower leg, initial encounter: Secondary | ICD-10-CM | POA: Diagnosis not present

## 2017-03-19 DIAGNOSIS — Y999 Unspecified external cause status: Secondary | ICD-10-CM | POA: Insufficient documentation

## 2017-03-19 DIAGNOSIS — X509XXA Other and unspecified overexertion or strenuous movements or postures, initial encounter: Secondary | ICD-10-CM | POA: Insufficient documentation

## 2017-03-19 DIAGNOSIS — Y9354 Activity, bowling: Secondary | ICD-10-CM | POA: Diagnosis not present

## 2017-03-19 DIAGNOSIS — Y929 Unspecified place or not applicable: Secondary | ICD-10-CM | POA: Diagnosis not present

## 2017-03-19 DIAGNOSIS — F17210 Nicotine dependence, cigarettes, uncomplicated: Secondary | ICD-10-CM | POA: Diagnosis not present

## 2017-03-19 DIAGNOSIS — I10 Essential (primary) hypertension: Secondary | ICD-10-CM | POA: Diagnosis not present

## 2017-03-19 DIAGNOSIS — S86811A Strain of other muscle(s) and tendon(s) at lower leg level, right leg, initial encounter: Secondary | ICD-10-CM | POA: Diagnosis not present

## 2017-03-19 DIAGNOSIS — S86911A Strain of unspecified muscle(s) and tendon(s) at lower leg level, right leg, initial encounter: Secondary | ICD-10-CM

## 2017-03-19 DIAGNOSIS — Z8673 Personal history of transient ischemic attack (TIA), and cerebral infarction without residual deficits: Secondary | ICD-10-CM | POA: Diagnosis not present

## 2017-03-19 DIAGNOSIS — Z760 Encounter for issue of repeat prescription: Secondary | ICD-10-CM | POA: Diagnosis not present

## 2017-03-19 DIAGNOSIS — M25561 Pain in right knee: Secondary | ICD-10-CM | POA: Diagnosis not present

## 2017-03-19 MED ORDER — TRAMADOL HCL 50 MG PO TABS: 50.0000 mg | ORAL_TABLET | Freq: Four times a day (QID) | ORAL | 0 refills | Status: DC | PRN

## 2017-03-19 MED ORDER — LOSARTAN POTASSIUM-HCTZ 100-25 MG PO TABS: 1.0000 | ORAL_TABLET | Freq: Every day | ORAL | 0 refills | Status: DC

## 2017-03-19 MED ORDER — METOPROLOL TARTRATE 25 MG PO TABS: 25.0000 mg | ORAL_TABLET | Freq: Two times a day (BID) | ORAL | 0 refills | Status: DC

## 2017-03-19 NOTE — ED Notes (Signed)
States that her right knee always swells up when she goes off her "blood pressure mediation"

## 2017-03-19 NOTE — Progress Notes (Signed)
Orthopedic Tech Progress Note Patient Details:  [name] September 05, 1971 [id number]  Ortho Devices Type of Ortho Device: Crutches, Knee Immobilizer Ortho Device/Splint Location: RLE Ortho Device/Splint Interventions: Ordered, Application   Braulio Bosch 03/19/2017, 11:04 PM

## 2017-03-19 NOTE — ED Provider Notes (Signed)
[facility] DEPT Provider Note   CSN: [id number] Arrival date & time: 03/19/17  2114   By signing my name below, I, Eunice Blase, attest that this documentation has been prepared under the direction and in the presence of [facility], [place]. Electronically Signed: Eunice Blase, Scribe. 03/19/17. 10:50 PM.   History   Chief Complaint Chief Complaint  Patient presents with  . Knee Pain   The history is provided by the patient and medical records. No language interpreter was used.    Debra Wade is a 46 y.o. female with h/o HTN, knee pain and tobacco use, self transported via private vehicle to the Emergency Department with concern for persistent, worsening R knee swelling x 5 days s/p a bowling outing the night before. She notes associated "stiffness", warmth, "clicking", mild non radiating "straining" pain and "fluid buildup" in the R knee. She notes increased inflammation with compression and improvement with ice and elevation. Symptoms unaffected with use of a knee sleeve. Patient also reports that her BP is elevated due to being out of her medications and request a refill. She notes high blood pressure readings measured at home. She has reportedly been unable to refill her Rx for HTN medications d/t financial complications. Pt reportedly R side dominant. She denies any trauma or falls.   Past Medical History:  Diagnosis Date  . Hypertension   . Stroke [facility])     There are no active problems to display for this patient.   Past Surgical History:  Procedure Laterality Date  . TUBAL LIGATION      OB History    No data available       Home Medications    Prior to Admission medications   Medication Sig Start Date End Date Taking? Authorizing Provider  famotidine (PEPCID) 20 MG tablet Take 1 tablet (20 mg total) by mouth 2 (two) times daily. 09/09/16   Isla Pence, MD  hydrochlorothiazide (HYDRODIURIL) 25 MG tablet Take 1 tablet (25 mg total) by mouth  daily. Patient not taking: Reported on 09/09/2016 08/27/15   Lily Kocher, PA-C  losartan-hydrochlorothiazide (HYZAAR) 100-25 MG tablet Take 1 tablet by mouth daily. 03/19/17   Golden Gilreath Bunnie Pion, NP  metoprolol tartrate (LOPRESSOR) 25 MG tablet Take 1 tablet (25 mg total) by mouth 2 (two) times daily. 03/19/17   Alee Gressman Bunnie Pion, NP  traMADol (ULTRAM) 50 MG tablet Take 1 tablet (50 mg total) by mouth every 6 (six) hours as needed. 03/19/17   Yanelis Osika Bunnie Pion, NP    Family History No family history on file.  Social History Social History  Substance Use Topics  . Smoking status: Current Every Day Smoker    Packs/day: 0.50    Types: Cigarettes  . Smokeless tobacco: Never Used  . Alcohol use No     Allergies   Sulfa drugs cross reactors   Review of Systems Review of Systems  Respiratory: Negative for chest tightness.   Cardiovascular: Negative for chest pain and palpitations.  Musculoskeletal: Positive for arthralgias and joint swelling.       Right knee pain  Skin: Negative for color change and wound.  Neurological: Negative for headaches.     Physical Exam Updated Vital Signs BP (!) 164/122 (BP Location: Left Arm)   Pulse 83   Temp 98.8 F (37.1 C) (Oral)   Resp 16   SpO2 98%   Physical Exam  Constitutional: She appears well-developed and well-nourished. No distress.  HENT:  Head: Normocephalic and atraumatic.  Eyes:  EOM are normal. Right eye exhibits no discharge.  Neck: Neck supple.  Cardiovascular: Normal rate.   Pulmonary/Chest: Effort normal.  Musculoskeletal: She exhibits tenderness. She exhibits no deformity.       Right knee: She exhibits no ecchymosis, no deformity, no laceration, no erythema and normal alignment. Decreased range of motion: due to pain. Swelling: minimal. Tenderness found.  DP/PT 2+, adequate circulation, swelling and tenderness to the anterior aspect of the R knee, slightly increased warmth  Neurological: She is alert.  Skin: Skin is warm and dry.   Psychiatric: She has a normal mood and affect. Her behavior is normal.  Nursing note and vitals reviewed.    ED Treatments / Results  DIAGNOSTIC STUDIES: Oxygen Saturation is 98% on RA, NL by my interpretation.    COORDINATION OF CARE: 10:41 PM Discussed treatment plan with pt at bedside and pt agreed to plan. Will order knee immobilizer and crutches. Pt prepared for d/c, advised of symptomatic care at home, F/U instructions and return precautions.   Labs (all labs ordered are listed, but only abnormal results are displayed) Labs Reviewed - No data to display  Radiology Dg Knee Complete 4 Views Right  Result Date: 03/19/2017 CLINICAL DATA:  Generalized RIGHT knee pain with motion for 5 days, pain began while bowling. EXAM: RIGHT KNEE - COMPLETE 4+ VIEW COMPARISON:  RIGHT knee radiograph Apr 16, 2014 FINDINGS: No acute fracture deformity or dislocation. Moderate tricompartmental joint space narrowing with periarticular sclerosis and marginal spurring. No destructive bony lesions. Soft tissue planes are nonsuspicious. IMPRESSION: Moderate tricompartmental osteoarthrosis without acute osseous process. Electronically Signed   By: Elon Alas M.D.   On: 03/19/2017 22:27   BP repeated prior to d/c and is 153/108, discussed with the patient importance of taking her medications on a regular basis. She voices understanding.  Procedures Knee immobilizer applied by ortho tec and patient feels some improvement and remains neurovascularly intact.   Procedures (including critical care time)  Medications Ordered in ED Medications - No data to display   Initial Impression / Assessment and Plan / ED Course  I have reviewed the triage vital signs and the nursing notes.  Pertinent imaging results that were available during my care of the patient were reviewed by me and considered in my medical decision making (see chart for details).   Final Clinical Impressions(s) / ED Diagnoses  46 y.o.  female with right knee pain that started after an evening of bowling stable for d/c without focal neuro deficits. Knee immobilizer applied, ice, elevation, crutches and f/u with ortho if symptoms persist. Pain management.  Discussed elevated BP and medication refilled. Patient agrees to f/u with PCP.  Return precautions.  Final diagnoses:  Strain of right knee, initial encounter  Medication refill  Essential hypertension    New Prescriptions Discharge Medication List as of 03/19/2017 10:58 PM    START taking these medications   Details  traMADol (ULTRAM) 50 MG tablet Take 1 tablet (50 mg total) by mouth every 6 (six) hours as needed., Starting Thu 03/19/2017, Print      I personally performed the services described in this documentation, which was scribed in my presence. The recorded information has been reviewed and is accurate.     [address] [place], NP 03/20/17 0102    Charlesetta Shanks, MD 03/23/17 1051

## 2017-03-19 NOTE — ED Notes (Signed)
Pt c/o right knee tightness and swelling. See providers assessment.

## 2017-03-19 NOTE — ED Triage Notes (Signed)
Pt reports to the ED for eval of right knee pain. She states that she went bowling on Saturday and on Sunday when she woke up her knee was swollen and painful. Denies any fall or trauma to the knee. She also states that she needs her BP medications refilled. She has been unable to get her BP medication refilled because she has not been seen by her PCP because they want her to pay a large bill before she can be seen.

## 2017-04-03 DIAGNOSIS — M1711 Unilateral primary osteoarthritis, right knee: Secondary | ICD-10-CM | POA: Diagnosis not present

## 2017-06-04 ENCOUNTER — Ambulatory Visit: Payer: 59 | Admitting: Family

## 2017-06-09 ENCOUNTER — Encounter: Payer: Self-pay | Admitting: Family

## 2017-06-09 ENCOUNTER — Ambulatory Visit (INDEPENDENT_AMBULATORY_CARE_PROVIDER_SITE_OTHER): Payer: 59 | Admitting: Family

## 2017-06-09 ENCOUNTER — Other Ambulatory Visit (INDEPENDENT_AMBULATORY_CARE_PROVIDER_SITE_OTHER): Payer: 59

## 2017-06-09 VITALS — BP 172/120 | HR 91 | Temp 98.7°F | Resp 16 | Ht 69.0 in | Wt 179.0 lb

## 2017-06-09 DIAGNOSIS — R5383 Other fatigue: Secondary | ICD-10-CM | POA: Diagnosis not present

## 2017-06-09 DIAGNOSIS — I10 Essential (primary) hypertension: Secondary | ICD-10-CM

## 2017-06-09 DIAGNOSIS — R0683 Snoring: Secondary | ICD-10-CM | POA: Insufficient documentation

## 2017-06-09 LAB — COMPREHENSIVE METABOLIC PANEL
ALK PHOS: 42 U/L (ref 39–117)
ALT: 9 U/L (ref 0–35)
AST: 18 U/L (ref 0–37)
Albumin: 4.7 g/dL (ref 3.5–5.2)
BUN: 9 mg/dL (ref 6–23)
CHLORIDE: 106 meq/L (ref 96–112)
CO2: 28 mEq/L (ref 19–32)
Calcium: 9.8 mg/dL (ref 8.4–10.5)
Creatinine, Ser: 0.89 mg/dL (ref 0.40–1.20)
GFR: 88.58 mL/min (ref >60.00)
GLUCOSE: 99 mg/dL (ref 70–99)
POTASSIUM: 3.9 meq/L (ref 3.5–5.1)
Sodium: 140 mEq/L (ref 135–145)
TOTAL PROTEIN: 7.8 g/dL (ref 6.0–8.3)
Total Bilirubin: 0.8 mg/dL (ref 0.2–1.2)

## 2017-06-09 LAB — TSH: TSH: 1.22 u[IU]/mL (ref 0.35–4.50)

## 2017-06-09 LAB — CBC
HEMATOCRIT: 42.1 % (ref 36.0–46.0)
HEMOGLOBIN: 14.4 g/dL (ref 12.0–15.0)
MCHC: 34.1 g/dL (ref 30.0–36.0)
MCV: 96 fl (ref 78.0–100.0)
Platelets: 242 10*3/uL (ref 150.0–400.0)
RBC: 4.38 Mil/uL (ref 3.87–5.11)
RDW: 14.2 % (ref 11.5–15.5)
WBC: 7 10*3/uL (ref 4.0–10.5)

## 2017-06-09 LAB — FERRITIN: Ferritin: 15.6 ng/mL (ref 10.0–291.0)

## 2017-06-09 LAB — HEMOGLOBIN A1C: Hgb A1c MFr Bld: 5.7 % (ref 4.6–6.5)

## 2017-06-09 LAB — IBC PANEL
Iron: 128 ug/dL (ref 42–145)
Saturation Ratios: 29.4 % (ref 20.0–50.0)
TRANSFERRIN: 311 mg/dL (ref 212.0–360.0)

## 2017-06-09 MED ORDER — LOSARTAN POTASSIUM 50 MG PO TABS: 50.0000 mg | ORAL_TABLET | Freq: Every day | ORAL | 1 refills | Status: DC

## 2017-06-09 NOTE — Assessment & Plan Note (Signed)
Fatigue of undetermined origin with concern for metabolic, cardiovascular, psychologic, or sleep origin. Obtain CBC, complete metabolic profile, ferritin, A1c, IBC panel, and TSH to rule out metabolic causes. Epworth Sleepiness Scale score of 10 with concern for possible cataplexy or narcolepsy although does snore with concern for sleep apnea. Referral to sleep medicine placed. Follow-up pending blood work results.

## 2017-06-09 NOTE — Patient Instructions (Addendum)
Thank you for choosing [facility].  SUMMARY AND INSTRUCTIONS:  Please start taking the losartan.  Continue to monitor your blood pressure at home and record the values at differing times throughout the day.   We will check your blood work today.  Referrals will be sent to cardiology and sleep medicine.  Follow up in 1 week for blood pressure check.  Medication:  Your prescription(s) have been submitted to your pharmacy or been printed and provided for you. Please take as directed and contact our office if you believe you are having problem(s) with the medication(s) or have any questions.  Labs:  Please stop by the lab on the lower level of the building for your blood work. Your results will be released to [place] (or called to you) after review, usually within 72 hours after test completion. If any changes need to be made, you will be notified at that same time.  1.) The lab is open from 7:30am to 5:30 pm Monday-Friday 2.) No appointment is necessary 3.) Fasting (if needed) is 6-8 hours after food and drink; black coffee and water are okay   Follow up:  If your symptoms worsen or fail to improve, please contact our office for further instruction, or in case of emergency go directly to the emergency room at the closest medical facility.    Fatigue Fatigue is feeling tired all of the time, a lack of energy, or a lack of motivation. Occasional or mild fatigue is often a normal response to activity or life in general. However, long-lasting (chronic) or extreme fatigue may indicate an underlying medical condition. Follow these instructions at home: Watch your fatigue for any changes. The following actions may help to lessen any discomfort you are feeling:  Talk to your health care provider about how much sleep you need each night. Try to get the required amount every night.  Take medicines only as directed by your health care provider.  Eat a healthy and nutritious diet.  Ask your health care provider if you need help changing your diet.  Drink enough fluid to keep your urine clear or pale yellow.  Practice ways of relaxing, such as yoga, meditation, massage therapy, or acupuncture.  Exercise regularly.  Change situations that cause you stress. Try to keep your work and personal routine reasonable.  Do not abuse illegal drugs.  Limit alcohol intake to no more than 1 drink per day for nonpregnant women and 2 drinks per day for men. One drink equals 12 ounces of beer, 5 ounces of wine, or 1 ounces of hard liquor.  Take a multivitamin, if directed by your health care provider.  Contact a health care provider if:  Your fatigue does not get better.  You have a fever.  You have unintentional weight loss or gain.  You have headaches.  You have difficulty: ? Falling asleep. ? Sleeping throughout the night.  You feel angry, guilty, anxious, or sad.  You are unable to have a bowel movement (constipation).  You skin is dry.  Your legs or another part of your body is swollen. Get help right away if:  You feel confused.  Your vision is blurry.  You feel faint or pass out.  You have a severe headache.  You have severe abdominal, pelvic, or back pain.  You have chest pain, shortness of breath, or an irregular or fast heartbeat.  You are unable to urinate or you urinate less than normal.  You develop abnormal bleeding, such as bleeding  from the rectum, vagina, nose, lungs, or nipples.  You vomit blood.  You have thoughts about harming yourself or committing suicide.  You are worried that you might harm someone else. This information is not intended to replace advice given to you by your health care provider. Make sure you discuss any questions you have with your health care provider. Document Released: 09/21/2007 Document Revised: 05/01/2016 Document Reviewed: 03/28/2014 Elsevier Interactive Patient Education  2018 [employer].   Hypertension Hypertension is another name for high blood pressure. High blood pressure forces your heart to work harder to pump blood. This can cause problems over time. There are two numbers in a blood pressure reading. There is a top number (systolic) over a bottom number (diastolic). It is best to have a blood pressure below 120/80. Healthy choices can help lower your blood pressure. You may need medicine to help lower your blood pressure if:  Your blood pressure cannot be lowered with healthy choices.  Your blood pressure is higher than 130/80.  Follow these instructions at home: Eating and drinking  If directed, follow the DASH eating plan. This diet includes: ? Filling half of your plate at each meal with fruits and vegetables. ? Filling one quarter of your plate at each meal with whole grains. Whole grains include whole wheat pasta, brown rice, and whole grain bread. ? Eating or drinking low-fat dairy products, such as skim milk or low-fat yogurt. ? Filling one quarter of your plate at each meal with low-fat (lean) proteins. Low-fat proteins include fish, skinless chicken, eggs, beans, and tofu. ? Avoiding fatty meat, cured and processed meat, or chicken with skin. ? Avoiding premade or processed food.  Eat less than 1,500 mg of salt (sodium) a day.  Limit alcohol use to no more than 1 drink a day for nonpregnant women and 2 drinks a day for men. One drink equals 12 oz of beer, 5 oz of wine, or 1 oz of hard liquor. Lifestyle  Work with your doctor to stay at a healthy weight or to lose weight. Ask your doctor what the best weight is for you.  Get at least 30 minutes of exercise that causes your heart to beat faster (aerobic exercise) most days of the week. This may include walking, swimming, or biking.  Get at least 30 minutes of exercise that strengthens your muscles (resistance exercise) at least 3 days a week. This may include lifting weights or pilates.  Do not use  any products that contain nicotine or tobacco. This includes cigarettes and e-cigarettes. If you need help quitting, ask your doctor.  Check your blood pressure at home as told by your doctor.  Keep all follow-up visits as told by your doctor. This is important. Medicines  Take over-the-counter and prescription medicines only as told by your doctor. Follow directions carefully.  Do not skip doses of blood pressure medicine. The medicine does not work as well if you skip doses. Skipping doses also puts you at risk for problems.  Ask your doctor about side effects or reactions to medicines that you should watch for. Contact a doctor if:  You think you are having a reaction to the medicine you are taking.  You have headaches that keep coming back (recurring).  You feel dizzy.  You have swelling in your ankles.  You have trouble with your vision. Get help right away if:  You get a very bad headache.  You start to feel confused.  You feel weak or  numb.  You feel faint.  You get very bad pain in your: ? Chest. ? Belly (abdomen).  You throw up (vomit) more than once.  You have trouble breathing. Summary  Hypertension is another name for high blood pressure.  Making healthy choices can help lower blood pressure. If your blood pressure cannot be controlled with healthy choices, you may need to take medicine. This information is not intended to replace advice given to you by your health care provider. Make sure you discuss any questions you have with your health care provider. Document Released: 05/12/2008 Document Revised: 10/22/2016 Document Reviewed: 10/22/2016 Elsevier Interactive Patient Education  [employer].

## 2017-06-09 NOTE — Assessment & Plan Note (Signed)
Continues to experience hypertension above goal 140/90 with current medication regimen in the setting of previous stroke. Discontinue metoprolol. Restart losartan. Consider titration of losartan or addition of hydrochlorothiazide if blood pressures remain elevated. Denies worst headache of life with no new symptoms of end organ damage noted on physical exam. Encouraged to monitor blood pressure at home and follow low-sodium diet. Follow-up in one week or sooner for blood pressure check.

## 2017-06-09 NOTE — Progress Notes (Signed)
Subjective:    Patient ID: Debra Wade, female    DOB: [birth year], 46 y.o.   MRN: [id number]  Chief Complaint  Patient presents with  . Establish Care    has issues with the fatigue and fainting feeling, hot flashes, blood pressure issues     HPI:  Debra Wade is a 46 y.o. female who  has a past medical history of Arthritis; Chicken pox; Frequent headaches; GERD (gastroesophageal reflux disease); Hypertension; Stroke [facility]); and Syncope. and presents today for an office visit to establish care.   1.) Hypertension - Currently prescribed losartan-hydrochlorothiazide and metoprolol. Not currently taking medications secondary to her previous history of "fainting spells" that are precipitated when her blood pressure gets too low. Describes headaches and feeling jittery. Does have history of previous stroke. Has tried taking just 1 medication. Previous treatment failure with lisinopril, hydrochlorothiazide, and amlodipine. With amlodipine she gained a significant amount of fluid.   2.) Fatigue - This is a new problem. Associated symptom of fatigue has been going on for several months. 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 Does snore at night and talk in her sleep. Menstrual cycles are mild to moderate. Recently evaluated in the ED for chest pain that was believed to be GERD. When off from work she can fall asleep doing many differing activities and has to force herself to be up.    Allergies  Allergen Reactions  . Sulfa Drugs Cross Reactors Anaphylaxis, Swelling and Rash      Outpatient Medications Prior to Visit  Medication Sig Dispense Refill  . famotidine (PEPCID) 20 MG tablet Take 1 tablet (20 mg total) by mouth 2 (two) times daily. 30 tablet 0  . traMADol (ULTRAM) 50 MG tablet Take 1 tablet (50 mg total) by mouth every 6 (six) hours as needed. 15 tablet 0  .  hydrochlorothiazide (HYDRODIURIL) 25 MG tablet Take 1 tablet (25 mg total) by mouth daily. (Patient not taking: Reported on 09/09/2016) 30 tablet 0  . losartan-hydrochlorothiazide (HYZAAR) 100-25 MG tablet Take 1 tablet by mouth daily. (Patient not taking: Reported on 06/09/2017) 30 tablet 0  . metoprolol tartrate (LOPRESSOR) 25 MG tablet Take 1 tablet (25 mg total) by mouth 2 (two) times daily. (Patient not taking: Reported on 06/09/2017) 60 tablet 0   No facility-administered medications prior to visit.      Past Medical History:  Diagnosis Date  . Arthritis   . Chicken pox   . Frequent headaches   . GERD (gastroesophageal reflux disease)   . Hypertension   . Stroke ([place])   . Syncope       Past Surgical History:  Procedure Laterality Date  . TUBAL LIGATION        Family History  Problem Relation Age of Onset  . Hypertension Mother   . Hypertension Father   . Heart disease Father   . Breast cancer Maternal Grandmother   . Prostate cancer Paternal Grandfather       Social History   Social History  . Marital status: Single    Spouse name: N/A  . Number of children: 5  . Years of education: 28   Occupational History  . [employer]    Social History Main Topics  . Smoking status: Current Every Day Smoker    Packs/day: 0.50    Years: 24.00  Types: Cigarettes  . Smokeless tobacco: Never Used  . Alcohol use No  . Drug use: No  . Sexual activity: Not on file   Other Topics Concern  . Not on file   Social History Narrative   Fun/Hobby: [place], anything relaxing   Denies abuse and feels safe at home.      Review of Systems  Constitutional: Positive for fatigue. Negative for chills and fever.  Eyes:       Negative for changes in vision  Respiratory: Negative for cough, chest tightness, shortness of breath and wheezing.   Cardiovascular: Negative for chest pain, palpitations and leg swelling.  Neurological: Negative for dizziness, weakness and  light-headedness.       Objective:    BP (!) 172/120 (BP Location: Left Arm, Patient Position: Sitting, Cuff Size: Large)   Pulse 91   Temp 98.7 F (37.1 C) (Oral)   Resp 16   Ht 5\' 9"  (1.753 m)   Wt 179 lb (81.2 kg)   SpO2 98%   BMI 26.43 kg/m  Nursing note and vital signs reviewed.  Physical Exam  Constitutional: She is oriented to person, place, and time. She appears well-developed and well-nourished. No distress.  Cardiovascular: Normal rate, regular rhythm, normal heart sounds and intact distal pulses.  Exam reveals no gallop and no friction rub.   No murmur heard. Pulmonary/Chest: Effort normal and breath sounds normal. No respiratory distress. She has no wheezes. She has no rales. She exhibits no tenderness.  Neurological: She is alert and oriented to person, place, and time.  Skin: Skin is warm and dry.  Psychiatric: She has a normal mood and affect. Her behavior is normal. Judgment and thought content normal.        Assessment & Plan:   Problem List Items Addressed This Visit      Cardiovascular and Mediastinum   Essential hypertension - Primary    Continues to experience hypertension above goal 140/90 with current medication regimen in the setting of previous stroke. Discontinue metoprolol. Restart losartan. Consider titration of losartan or addition of hydrochlorothiazide if blood pressures remain elevated. Denies worst headache of life with no new symptoms of end organ damage noted on physical exam. Encouraged to monitor blood pressure at home and follow low-sodium diet. Follow-up in one week or sooner for blood pressure check.      Relevant Medications   losartan (COZAAR) 50 MG tablet   Other Relevant Orders   Comprehensive metabolic panel (Completed)   Ambulatory referral to Cardiology     Other   Fatigue    Fatigue of undetermined origin with concern for metabolic, cardiovascular, psychologic, or sleep origin. Obtain CBC, complete metabolic profile,  ferritin, A1c, IBC panel, and TSH to rule out metabolic causes. Epworth Sleepiness Scale score of 10 with concern for possible cataplexy or narcolepsy although does snore with concern for sleep apnea. Referral to sleep medicine placed. Follow-up pending blood work results.      Relevant Orders   CBC (Completed)   Comprehensive metabolic panel (Completed)   Hemoglobin A1c (Completed)   Ferritin (Completed)   IBC panel (Completed)   TSH (Completed)   Snoring   Relevant Orders   Ambulatory referral to Neurology       I have discontinued Ms. Bristow's hydrochlorothiazide, famotidine, metoprolol tartrate, losartan-hydrochlorothiazide, and traMADol. I am also having her start on losartan.   Meds ordered this encounter  Medications  . losartan (COZAAR) 50 MG tablet    Sig: Take 1 tablet (50  mg total) by mouth daily.    Dispense:  30 tablet    Refill:  1    Order Specific Question:   Supervising Provider    Answer:   Pricilla Holm A [[id number]]     Follow-up: Return in about 1 month (around 07/10/2017), or if symptoms worsen or fail to improve.  Mauricio Po, FNP

## 2017-06-12 ENCOUNTER — Telehealth: Payer: Self-pay | Admitting: Family

## 2017-06-12 NOTE — Telephone Encounter (Signed)
This pt called wanting to speak with you about some of the "symtpoms" that she has been experiencing since being on the BP med that [facility] prescribed. She can be reached at [phone].

## 2017-06-12 NOTE — Telephone Encounter (Signed)
Returned call. No answer or VM set up.

## 2017-06-15 NOTE — Telephone Encounter (Signed)
States that since she has been on the new BP med it still has been reading around 137/104 area. She said when the other night she had sex and her BP dropped to 118/70s and she had a panic attack and felt SOB but has not felt that way since. She says it is like her body does not tolerate lower blood pressures. She is coming on Friday for a nurse visit. I told her to keep a record of BP readings and bring them in so we can see how the medication is working. I advised that if she has more panic attacks then to call and let us know.

## 2017-06-16 NOTE — Telephone Encounter (Signed)
Noted  

## 2017-06-18 ENCOUNTER — Ambulatory Visit: Payer: 59 | Admitting: General Practice

## 2017-06-18 VITALS — BP 170/112

## 2017-06-18 DIAGNOSIS — I10 Essential (primary) hypertension: Secondary | ICD-10-CM

## 2017-06-18 MED ORDER — LOSARTAN POTASSIUM 100 MG PO TABS: 100.0000 mg | ORAL_TABLET | Freq: Every day | ORAL | 1 refills | Status: DC

## 2017-06-18 NOTE — Progress Notes (Signed)
Noted.  Patient has been notified and instructed to increase Losartan to 100 mg daily and to have BP re-checked in 2 weeks.  Patient verbalized understanding and thanked me for calling.

## 2017-06-18 NOTE — Addendum Note (Signed)
Addended by: Mauricio Po D on: 06/18/2017 08:28 AM   Modules accepted: Orders

## 2017-06-18 NOTE — Progress Notes (Signed)
Please increase losartan to 100 mg daily. Recheck in 2 weeks.

## 2017-06-19 ENCOUNTER — Encounter: Payer: Self-pay | Admitting: Family

## 2017-06-25 ENCOUNTER — Ambulatory Visit (INDEPENDENT_AMBULATORY_CARE_PROVIDER_SITE_OTHER): Payer: 59 | Admitting: Cardiology

## 2017-06-25 ENCOUNTER — Encounter: Payer: Self-pay | Admitting: Cardiology

## 2017-06-25 VITALS — BP 136/90 | HR 100 | Ht 69.0 in | Wt 180.2 lb

## 2017-06-25 DIAGNOSIS — I10 Essential (primary) hypertension: Secondary | ICD-10-CM | POA: Diagnosis not present

## 2017-06-25 DIAGNOSIS — I951 Orthostatic hypotension: Secondary | ICD-10-CM

## 2017-06-25 MED ORDER — CARVEDILOL 3.125 MG PO TABS: 3.1250 mg | ORAL_TABLET | Freq: Two times a day (BID) | ORAL | 11 refills | Status: DC

## 2017-06-25 NOTE — Patient Instructions (Signed)
Medication Instructions:  1) START COREG 3.125 mg TWICE DAILY  Labwork: None  Testing/Procedures: Your physician has requested that you have an echocardiogram. Echocardiography is a painless test that uses sound waves to create images of your heart. It provides your doctor with information about the size and shape of your heart and how well your heart's chambers and valves are working. This procedure takes approximately one hour. There are no restrictions for this procedure.  Follow-Up: Your physician recommends that you schedule a follow-up appointment in 1-2 weeks in the HTN CLINIC.  Your physician recommends that you schedule a follow-up appointment in 3 months with Dr. Theodosia Blender assistant.  Your physician wants you to follow-up in: 6 months with Dr. Radford Pax. You will receive a reminder letter in the mail two months in advance. If you don't receive a letter, please call our office to schedule the follow-up appointment.   Any Other Special Instructions Will Be Listed Below (If Applicable).     If you need a refill on your cardiac medications before your next appointment, please call your pharmacy.

## 2017-06-25 NOTE — Progress Notes (Signed)
Cardiology Office Note    Date:  06/25/2017   ID:  Debra Wade, DOB Oct 07, 1971, MRN [id number]  PCP:  Golden Circle, FNP  Cardiologist:  Fransico Him, MD   Chief Complaint  Patient presents with  . Hypertension    History of Present Illness:  Debra Wade is a 46 y.o. female who is being seen today for the evaluation of HTN at the request of Golden Circle, FNP.  [name] with a history of HAs, HTN and CVA who is referred by her PCP for help in management of HTN. She has been on Losartan HCTZ and metoprolol but had some problems with syncope in the past and stopped taking her meds.  She did not tolerate amlodipine due to LE edema. Her PCP stopped her Losartan HCT and  Metoprolol and started her just on Losartan which was titrated to 100mg  daily.  She is now referred here for further evaulation.  She has been having problems with her BP since 2009.  She has a history of preeclampsia in the past. She denies any chest pain or pressure, SOB, DOE(except when getting up too fasat), dizziness, palpitations or syncope recently  She does still have migraine headaches.  She has some varicose veins but no significant edema. She has had some problems with fatigue and is scheduled to see Neuro for sleep study.   Past Medical History:  Diagnosis Date  . Arthritis   . Chicken pox   . Frequent headaches   . GERD (gastroesophageal reflux disease)   . Hypertension   . Stroke ([place])   . Syncope     Past Surgical History:  Procedure Laterality Date  . TUBAL LIGATION      Current Medications: Current Meds  Medication Sig  . losartan (COZAAR) 100 MG tablet Take 1 tablet (100 mg total) by mouth daily.    Allergies:   Sulfa drugs cross reactors   Social History   Social History  . Marital status: Single    Spouse name: N/A  . Number of children: 5  . Years of education: 62   Occupational History  . [employer]    Social History Main Topics  . Smoking status:  Current Every Day Smoker    Packs/day: 0.50    Years: 24.00    Types: Cigarettes  . Smokeless tobacco: Never Used  . Alcohol use No  . Drug use: No  . Sexual activity: Not Asked   Other Topics Concern  . None   Social History Narrative   Fun/Hobby: [place], anything relaxing   Denies abuse and feels safe at home.     Family History:  The patient's family history includes Breast cancer in her maternal grandmother; Heart disease in her father; Hypertension in her father and mother; Prostate cancer in her paternal grandfather.   ROS:   Please see the history of present illness.    ROS All other systems reviewed and are negative.  No flowsheet data found.     PHYSICAL EXAM:   VS:  BP 136/90   Pulse 100   Ht 5\' 9"  (1.753 m)   Wt 180 lb 3.2 oz (81.7 kg)   SpO2 99%   BMI 26.61 kg/m    GEN: Well nourished, well developed, in no acute distress  HEENT: normal  Neck: no JVD, carotid bruits, or masses Cardiac: RRR; no murmurs, rubs, or gallops,no edema.  Intact distal pulses bilaterally.  Respiratory:  clear to auscultation  bilaterally, normal work of breathing GI: soft, nontender, nondistended, + BS MS: no deformity or atrophy  Skin: warm and dry, no rash Neuro:  Alert and Oriented x 3, Strength and sensation are intact Psych: euthymic mood, full affect  Wt Readings from Last 3 Encounters:  06/25/17 180 lb 3.2 oz (81.7 kg)  06/09/17 179 lb (81.2 kg)  11/28/15 165 lb (74.8 kg)      Studies/Labs Reviewed:   EKG:  EKG is not ordered today.   Recent Labs: 06/09/2017: ALT 9; BUN 9; Creatinine, Ser 0.89; Hemoglobin 14.4; Platelets 242.0; Potassium 3.9; Sodium 140; TSH 1.22   Lipid Panel    Component Value Date/Time   CHOL 207 (H) 07/13/2008 2018   TRIG 76 07/13/2008 2018   HDL 52 07/13/2008 2018   CHOLHDL 4.0 Ratio 07/13/2008 2018   VLDL 15 07/13/2008 2018   LDLCALC 140 (H) 07/13/2008 2018    Additional studies/ records that were reviewed today include:  Office  notes    ASSESSMENT:    1. Essential hypertension   2. Orthostatic hypotension      PLAN:  In order of problems listed above:  1. HTN - she has had problems with low and high BPs prompting discontinuation and BB and diuretic.  She does have a history of preeclampsia so likely has a component of endothelin dysfunction.  She is intolerant to amlodipine due to LE edema.  Her DBP is mildly elevated today and her HR is also elevated.  Her lab work was reviewed and her potassium is normal which makes hyperaldo state less likely.  Her TSH was normal. She has a strong family history of HTN.  I am going to continue her on Losartan 100mg  daily and add Carvedilol low dose at 3.125mg  BID.  She will followup in HTN clinic in 1 week.  I will check an echo to see if she has any end organ effects from longstanding HTN.    2.  Orthostatic hypotension - she has problems with swings in her BP from high to low likely exacerbated by diuretics in the past and standing on her feet for long periods of time.  I have recommended compression hose during the day while at work to help with her orthostasis.   She will followup with the PA in 3 months and me in 6 months  Medication Adjustments/Labs and Tests Ordered: Current medicines are reviewed at length with the patient today.  Concerns regarding medicines are outlined above.  Medication changes, Labs and Tests ordered today are listed in the Patient Instructions below.  There are no Patient Instructions on file for this visit.   Signed, Fransico Him, MD  06/25/2017 2:36 PM    [facility] [place], [place], [place]  [address] Phone: [phone]; Fax: [phone]

## 2017-07-02 ENCOUNTER — Ambulatory Visit: Payer: 59 | Admitting: General Practice

## 2017-07-02 VITALS — BP 138/102

## 2017-07-02 DIAGNOSIS — I10 Essential (primary) hypertension: Secondary | ICD-10-CM

## 2017-07-02 NOTE — Progress Notes (Signed)
Due to hypersensitivity to medication recommend continuing current medication and follow up with cardiology as scheduled.

## 2017-07-06 ENCOUNTER — Ambulatory Visit (HOSPITAL_COMMUNITY): Payer: 59 | Attending: Internal Medicine

## 2017-07-06 ENCOUNTER — Ambulatory Visit (INDEPENDENT_AMBULATORY_CARE_PROVIDER_SITE_OTHER): Payer: 59 | Admitting: Pharmacist

## 2017-07-06 ENCOUNTER — Other Ambulatory Visit: Payer: Self-pay

## 2017-07-06 VITALS — BP 158/110 | HR 78

## 2017-07-06 DIAGNOSIS — I5030 Unspecified diastolic (congestive) heart failure: Secondary | ICD-10-CM | POA: Insufficient documentation

## 2017-07-06 DIAGNOSIS — I10 Essential (primary) hypertension: Secondary | ICD-10-CM

## 2017-07-06 DIAGNOSIS — F17210 Nicotine dependence, cigarettes, uncomplicated: Secondary | ICD-10-CM | POA: Insufficient documentation

## 2017-07-06 DIAGNOSIS — Z720 Tobacco use: Secondary | ICD-10-CM

## 2017-07-06 DIAGNOSIS — I951 Orthostatic hypotension: Secondary | ICD-10-CM | POA: Diagnosis not present

## 2017-07-06 MED ORDER — CARVEDILOL 6.25 MG PO TABS: 6.2500 mg | ORAL_TABLET | Freq: Two times a day (BID) | ORAL | 11 refills | Status: DC

## 2017-07-06 MED ORDER — NICOTINE 14 MG/24HR TD PT24: 14.0000 mg | MEDICATED_PATCH | Freq: Every day | TRANSDERMAL | 3 refills | Status: DC

## 2017-07-06 MED ORDER — NICOTINE POLACRILEX 2 MG MT GUM: 2.0000 mg | CHEWING_GUM | OROMUCOSAL | 1 refills | Status: DC | PRN

## 2017-07-06 NOTE — Progress Notes (Signed)
Patient ID: [name]                 DOB: [birth year]                      MRN: [id number]     HPI: Debra Wade is a 46 y.o. female referred by Dr. Radford Wade to HTN clinic. PMH is significant for HTN, CVA, and headaches. She was recently referred by PCP to cardiology for management of HTN. She had taken losartan-HCTZ and metoprolol in the past but had some problems with syncope and stopped taking her medications. She experienced LEE on amlodipine. She has had HTN since 2009 as well as a history of preeclampsia. At visit 1 week ago with Dr Debra Wade, pt was started on low dose carvedilol 3.125mg  daily. She was encouraged to wear compression hose to help with orthostasis which has been previously exacerbated by diuretics and standing on her feet for long periods of time.  Pt presents today in good spirits with her husband and two children after her echo. She is motivated to lower her BP, and is particularly concerned about her elevated diastolic reading. She reports that her BP in her right arm is higher than her left arm due to residual effects from her stroke ~20 years ago. She reports her BP at her PCP recently was 160/122 in her R arm and 130/102 in her L arm. She had slight chest pain x1 last week which resolved after 1 minute of breathing exercises.    BP readings today in clinic are similar between each arm - 154/102 L arm and 158/110 R arm. Her BP readings at home range 12-130s/100 using a bicep cuff she has had for 2 years. She checks her BP twice a day. She denies dizziness, blurred vision, falls, or headache. 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 She is very interested in quitting smoking. She currently smokes 1/2 PPD and has not tried to quit before. She heard that certain smoking cessation products would raise her BP. After discussion, she is interested in nicotine replacement therapy.  Because her BP has remained elevated for years, she is sensitive to  changes in her HTN regimen. She reports that it took her 2 weeks for her body to adjust to the dose increase of losartan from 50mg  to 100mg  daily. However, she reports tolerating the addition of carvedilol 3.125mg  BID dosing well, and wonders if she may need a higher dose.  Current HTN meds: losartan 100mg  daily (AM), carvedilol 3.125mg  BID Previously tried: amlodipine - LEE, hydrochlorothiazide - syncope BP goal: <130/52mmHg  Family History: The patient's family history includes Breast cancer in her maternal grandmother; Heart disease in her father; Hypertension in her father and mother; Prostate cancer in her paternal grandfather.   Social History: Currently smokes 1/2 PPD, denies alcohol and illicit drug use.  Diet: Eats 1 meal a day ~2:30pm before work. Occasionally eats yogurt or bagel for breakfast. Low sodium diet. 2-3 cups of coffee each day.   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 Checks twice a day - AM and at HS.  Wt Readings from Last 3 Encounters:  06/25/17 180 lb 3.2 oz (81.7 kg)  06/09/17 179 lb (81.2 kg)  11/28/15 165 lb (74.8 kg)   BP Readings from Last 3 Encounters:  07/02/17 Marland Kitchen)  138/102  06/25/17 136/90  06/18/17 (!) 170/112   Pulse Readings from Last 3 Encounters:  06/25/17 100  06/09/17 91  03/19/17 76    Renal function: CrCl cannot be calculated (Patient's most recent lab result is older than the maximum 21 days allowed.).  Past Medical History:  Diagnosis Date  . Arthritis   . Chicken pox   . Frequent headaches   . GERD (gastroesophageal reflux disease)   . Hypertension   . Stroke ([place])   . Syncope     Current Outpatient Prescriptions on File Prior to Visit  Medication Sig Dispense Refill  . carvedilol (COREG) 3.125 MG tablet Take 1 tablet (3.125 mg total) by mouth 2 (two) times daily. 60 tablet 11  . losartan (COZAAR) 100 MG tablet Take 1 tablet (100 mg total) by  mouth daily. 30 tablet 1   No current facility-administered medications on file prior to visit.     Allergies  Allergen Reactions  . Sulfa Drugs Cross Reactors Anaphylaxis, Swelling and Rash     Assessment/Plan:  1. Hypertension - BP remains above goal <130/45mmHg. Will continue losartan 100mg  daily and increase carvedilol 6.25mg  BID. Will plan to titrate medications slowly given pt's fluctuations in BP readings. Encouraged her to purchase compression stockings to help with hypotension at work during long standing shifts. Encouraged pt to stay active to help lower diastolic readings. F/u in clinic in 3 weeks.  2. Smoking cessation - Pt currently smoking 1/2 PPD and is very motivated to quit. Will start nicotine patch 14mg  daily and nicotine gum 2mg  as needed for cravings. Advised pt that she can remove her patch at night if she experiences insomnia or vivid dreams. Counseled on park and chew method for successful use of nicotine gum. Advised pt if prescriptions are not covered by her insurance to look OTC for nicotine replacement products. Will reassess efforts to quit at f/u visit in 3 weeks.   Debra Wade, PharmD, CPP, [facility] [address], [place], [place] [address] Phone: [phone]; Fax: [phone] 07/06/2017 4:45 PM

## 2017-07-06 NOTE — Patient Instructions (Addendum)
Increase the dose of your carvedilol to 6.25mg  twice daily. You can take 2 of your 3.125mg  tablets twice a day until you run out, then pick up your prescription for your higher dose to take 1 tablet twice a day  Continue taking your losartan 100mg  daily  Start using the nicotine patch once a day and the gum as needed - work on cutting back on tobacco use  Follow up in clinic in 3 weeks for blood pressure check

## 2017-07-08 ENCOUNTER — Telehealth: Payer: Self-pay | Admitting: Pharmacist

## 2017-07-08 ENCOUNTER — Telehealth: Payer: Self-pay | Admitting: Family

## 2017-07-08 MED ORDER — NICOTINE POLACRILEX 2 MG MT GUM: 2.0000 mg | CHEWING_GUM | OROMUCOSAL | 1 refills | Status: DC | PRN

## 2017-07-08 NOTE — Telephone Encounter (Signed)
Pt called to report that insurance covers both gum and patch in full. She requests rx for gum be resent. Which has been done.

## 2017-07-08 NOTE — Telephone Encounter (Signed)
Please continue to follow up with cardiology for medication management and can follow up with [place] in 3 months or sooner if needed.

## 2017-07-08 NOTE — Telephone Encounter (Signed)
Patient followed up with her cardiology. She states they informed her everything was normal. That her BP was not messing with her heart.?? They did change her BP medication, they upped it. She states it is not working any better. Her BP has still been running between (( 145-150/102 -110)) She would like to know if she should make a follow up appointment with you. She thought that once she followed with cardiology we would reach out to inform her what to do next. Please advise or follow up with patient. Thank you.

## 2017-07-09 NOTE — Telephone Encounter (Signed)
Tried calling pt again and no answer or VM set up.

## 2017-07-09 NOTE — Telephone Encounter (Signed)
Tried calling pt to inform of the message below. No answer and no VM set up. Will try back later .

## 2017-07-14 ENCOUNTER — Institutional Professional Consult (permissible substitution): Payer: Self-pay | Admitting: Neurology

## 2017-07-15 ENCOUNTER — Telehealth: Payer: Self-pay | Admitting: Cardiology

## 2017-07-15 MED ORDER — NICOTINE POLACRILEX 4 MG MT GUM: 4.0000 mg | CHEWING_GUM | OROMUCOSAL | 1 refills | Status: DC | PRN

## 2017-07-15 NOTE — Telephone Encounter (Signed)
New message   Pt wants to let Jinny Blossom know which Rx her insurance will cover. Insurance will cover Nicorette 4MG . Number [id number].

## 2017-07-15 NOTE — Telephone Encounter (Signed)
New rx sent to pharmacy, pt is aware.

## 2017-07-27 ENCOUNTER — Ambulatory Visit: Payer: 59

## 2017-07-27 NOTE — Progress Notes (Deleted)
Patient ID: Debra Wade                 DOB: [birth year]                      MRN: [id number]     HPI: Debra Wade is a 46 y.o. female referred by Dr. Radford Pax to HTN clinic. PMH is significant for HTN, CVA, and headaches. She was recently referred by PCP to cardiology for management of HTN. She had taken losartan-HCTZ and metoprolol in the past but had some problems with syncope and stopped taking her medications. She experienced LEE on amlodipine. She has had HTN since 2009 as well as a history of preeclampsia. Pt reported that her BP in her right arm is higher than her left arm due to residual effects from her stroke ~20 years ago. Pt also reported that she is sensitive to changes in her HTN regimen. At her last HTN clinic visit, BP was elevated at 158/110. Carvedilol was increased to 6.25 mg BID. Pt was also motivated to quit smoking, so nicotine patch 14mg  daily and nicotine gum 2mg  as needed for cravings were started. Pt presents today for f/u.   Current HTN meds: losartan 100mg  daily (AM), carvedilol 6.25 mg BID  Previously tried: amlodipine - LEE, hydrochlorothiazide - syncope  BP goal: <130/49mmHg  Family History: The patient's family history includes Breast cancer in her maternal grandmother; Heart disease in her father; Hypertension in her father and mother; Prostate cancer in her paternal grandfather.  Social History: Currently smokes 1/2 PPD, denies alcohol and illicit drug use  Diet: Eats 1 meal a day ~2:30pm before work. Occasionally eats yogurt or bagel for breakfast. Low sodium diet. 2-3 cups of coffee each day.   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  Past Medical History:  Diagnosis Date  . Arthritis   . Chicken pox   . Frequent headaches   . GERD (gastroesophageal reflux disease)   . Hypertension   . Stroke ([place])   . Syncope     Current Outpatient Prescriptions on File Prior to Visit  Medication Sig Dispense Refill  . carvedilol (COREG) 6.25 MG tablet Take 1 tablet (6.25 mg total) by mouth 2 (two) times daily. 60 tablet 11  . losartan (COZAAR) 100 MG tablet Take 1 tablet (100 mg total) by mouth daily. 30 tablet 1  . nicotine (NICODERM CQ - DOSED IN MG/24 HOURS) 14 mg/24hr patch Place 1 patch (14 mg total) onto the skin daily. 28 patch 3  . nicotine polacrilex (NICORETTE) 4 MG gum Take 1 each (4 mg total) by mouth as needed for smoking cessation. 100 each 1   No current facility-administered medications on file prior to visit.     Allergies  Allergen Reactions  . Sulfa Drugs Cross Reactors Anaphylaxis, Swelling and Rash     Assessment/Plan:

## 2017-07-28 ENCOUNTER — Encounter (INDEPENDENT_AMBULATORY_CARE_PROVIDER_SITE_OTHER): Payer: Self-pay

## 2017-07-28 ENCOUNTER — Ambulatory Visit (INDEPENDENT_AMBULATORY_CARE_PROVIDER_SITE_OTHER): Payer: 59 | Admitting: Pharmacist

## 2017-07-28 VITALS — BP 136/100 | HR 72

## 2017-07-28 DIAGNOSIS — I10 Essential (primary) hypertension: Secondary | ICD-10-CM | POA: Diagnosis not present

## 2017-07-28 MED ORDER — CARVEDILOL 12.5 MG PO TABS: 12.5000 mg | ORAL_TABLET | Freq: Two times a day (BID) | ORAL | 11 refills | Status: DC

## 2017-07-28 NOTE — Progress Notes (Signed)
Patient ID: Debra Wade                 DOB: 1971/11/03                      MRN: [id number]     HPI: Debra Wade is a 46 y.o. female referred by Dr. Radford Pax to HTN clinic. PMH is significant for HTN, CVA, and headaches. She was recently referred by PCP to cardiology for management of HTN. She had taken losartan-HCTZ and metoprolol in the past but had some problems with syncope and stopped taking her medications. She experienced LEE on amlodipine. She has had HTN since 2009 as well as a history of preeclampsia. At last visit 3 weeks ago, carvedilol dosing was increased to 6.25mg  BID. Titrating medications slowly due to pt sensitivity. She was encouraged to wear compression hose to help with orthostasis which has been previously exacerbated by diuretics and standing on her feet for long periods of time.  Pt reports feeling well overall. She is tolerating the higher dose of carvedilol well. She has not checked her BP at home since her last visit.  She denies dizziness, blurred vision, falls, or headache. Because her BP has remained elevated for years, she is sensitive to changes in her HTN regimen. She reports that it took her 2 weeks for her body to adjust to the dose increase of losartan from 50mg  to 100mg  daily. However, she reports tolerating the higher dose of carvedilol 6.25mg  BID dosing well. 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 Pt took her BP medications this AM and has not had any caffeine. She reports that her BP in her right arm is higher than her left arm due to residual effects from her stroke ~20 years ago. She remains motivated to lower her BP and quit smoking.   144/100 clinic reading in R arm 136/100 clinic reading in L arm  She has worked on cutting back on her cigarette intake - she is down to smoking 5 cigarettes per day. The nicotine gum has been helping with her cravings. She stopped using the patch because it made her feel nervous and  jittery. She has a daughter going off to college, has a financial incentive at work to quit, and has a supportive family.  Current HTN meds: losartan 100mg  daily (AM), carvedilol 6.25mg  BID Previously tried: amlodipine - LEE, hydrochlorothiazide - syncope BP goal: <130/53mmHg  Family History: The patient's family history includes Breast cancer in her maternal grandmother; Heart disease in her father; Hypertension in her father and mother; Prostate cancer in her paternal grandfather.  Social History: Currently smokes 1/2 PPD, denies alcohol and illicit drug use.  Diet: Eats 1 meal a day ~2:30pm before work. Occasionally eats yogurt or bagel for breakfast. Low sodium diet. 2-3 cups of coffee each day.   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  Past Medical History:  Diagnosis Date  . Arthritis   . Chicken pox   . Frequent headaches   . GERD (gastroesophageal reflux disease)   . Hypertension   . Stroke ([place])   . Syncope     Current Outpatient Prescriptions on File Prior to Visit  Medication Sig Dispense Refill  . carvedilol (COREG) 6.25 MG tablet Take 1 tablet (6.25 mg total) by mouth 2 (two) times daily. 60 tablet 11  . losartan (COZAAR) 100 MG tablet Take 1 tablet (100 mg total) by mouth daily. 30 tablet 1  . nicotine (NICODERM CQ - DOSED IN MG/24 HOURS) 14 mg/24hr patch Place 1 patch (14 mg total) onto the skin daily. 28 patch 3  . nicotine polacrilex (NICORETTE) 4 MG  gum Take 1 each (4 mg total) by mouth as needed for smoking cessation. 100 each 1   No current facility-administered medications on file prior to visit.     Allergies  Allergen Reactions  . Sulfa Drugs Cross Reactors Anaphylaxis, Swelling and Rash     Assessment/Plan:  1. Hypertension - BP has improved significantly from last visit with systolic BP dropping ~[id number] in each arm. BP does still remain above goal < 130/77mmHg. BP in R arm remains higher than L arm due to residual effects from previous stroke. Will increase carvedilol to 12.5mg  BID and continue losartan 100mg  daily. She will continue to stay active and eat a diet low in sodium. Advised pt to monitor her BP at home. Will f/u in HTN clinic in 1 month.  2. Smoking cessation - Congratulated pt on cutting back to 5 cigarettes per day. She will continue to use the nicotine gum to help with cravings. She stopped the patch due to jitteriness. Pt has a good support system at home and multiple financial incentives to quit smoking. Will reassess efforts at next visit.   Johndaniel Catlin E. Sixto Bowdish, PharmD, CPP, [facility] [address], [place], [place] [address] Phone: [phone]; Fax: [phone] 07/28/2017 3:40 PM

## 2017-07-28 NOTE — Patient Instructions (Addendum)
It was nice to see you today, your blood pressure has improved a lot since last visit!  Increase carvedilol to 12.5mg  twice a day. You can double up and take 2 of your 6.25mg  tablets twice a day until you run out. Then, pick up a new prescription for the 12.5mg  tablet and take 1 tablet twice a day  Continue to take losartan 100mg  each day  Check your blood pressure at home and bring in home readings at next visit  Continue to use your nicotine gum and work on cutting back on smoking  Follow up in clinic in 1 month

## 2017-08-12 ENCOUNTER — Telehealth: Payer: Self-pay

## 2017-08-12 ENCOUNTER — Institutional Professional Consult (permissible substitution): Payer: Self-pay | Admitting: Neurology

## 2017-08-12 NOTE — Telephone Encounter (Signed)
Pt did not show for her sleep consult with Dr. Rexene Alberts today. Pt also did not show for her sleep consult on 07/14/2017 with Dr. Rexene Alberts.  Ok to dismiss per [facility] policy?

## 2017-08-12 NOTE — Telephone Encounter (Signed)
Please follow dismissal protocol as per our No Show Policy for new pts.    

## 2017-08-13 ENCOUNTER — Encounter: Payer: Self-pay | Admitting: Neurology

## 2017-08-18 ENCOUNTER — Telehealth: Payer: Self-pay | Admitting: Family

## 2017-08-18 NOTE — Telephone Encounter (Signed)
Noted  

## 2017-08-18 NOTE — Telephone Encounter (Signed)
Patient cancelled appt with 24 hour notice on 8/7 then no showed on 9/5.  Patient has been dismissed from [facility].    FYI:  In regard to referral

## 2017-08-25 ENCOUNTER — Ambulatory Visit (INDEPENDENT_AMBULATORY_CARE_PROVIDER_SITE_OTHER): Payer: 59 | Admitting: Pharmacist

## 2017-08-25 VITALS — BP 156/106 | HR 71

## 2017-08-25 DIAGNOSIS — Z720 Tobacco use: Secondary | ICD-10-CM

## 2017-08-25 DIAGNOSIS — I10 Essential (primary) hypertension: Secondary | ICD-10-CM

## 2017-08-25 MED ORDER — SPIRONOLACTONE 25 MG PO TABS: 12.5000 mg | ORAL_TABLET | Freq: Every day | ORAL | 2 refills | Status: DC

## 2017-08-25 NOTE — Progress Notes (Signed)
Patient ID: RUT BETTERTON                 DOB: 03-Nov-1971                      MRN: [id number]     HPI: Debra Wade is a 46 y.o. female referred by Dr. Radford Pax to HTN clinic. PMH is significant for HTN, CVA, and headaches. She was recently referred by PCP to cardiology for management of HTN. She had taken losartan-HCTZ and metoprolol in the past but had some problems with syncope and stopped taking her medications. Sheexperienced LEE on amlodipine. She has had HTN since 2009 as well as a history of preeclampsia. Because her BP has remained elevated for years, she is sensitive to changes in her HTN regimen. She reports that it took her 2 weeks for her body to adjust to the dose increase of losartan from 50mg  to 100mg  daily.  On 07/08/2017 visit, carvedilol dosing was increased to 6.25mg  BID. Titrating medications slowly due to pt sensitivity. She was encouraged to wear compression hose to help with orthostasis which has been previously exacerbated by diuretics and standing on her feet for long periods of time. 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 She reports that her BP in her right arm is higher than her left arm due to residual effects from her stroke ~20 years ago. At last visit, pt reports tolerating higher dose of carvedilol well. BP in clinic was 144/100 in R arm and 136/100 in L arm. Carvedilol was increased to 12.5mg  BID and losartan 100mg  daily was continued. She remains motivated to lower her BP and quit smoking.   Pt presents today for follow up. Pt states she has had a headache and dizziness for the past couple of days but thinks it may be due to a sinus infection as she has felt congestion in her sinuses. Besides this, pt has not experienced any dizziness, blurred vision, lightheadedness, or headaches. Pt states that she is tolerating the medications well. She feels that her BP may continue to be elevated due to work.  She states it is a constant stressor  and that it is contributing to her smoking as well. She reports that she is still smoking around 4-5 cigarettes a day, usually on days that she works.  She is still using nicotine gum and motivated to quit. She has been ibuprofen 800mg  for the past 4 days.  Current HTN meds: carvedilol 12.5mg  BID, losartan 100mg  daily Previously tried: amlodipine - LEE, hydrochlorothiazide- syncope BP goal: <130/71mmHg  Family History: The patient's family history includes Breast cancer in her maternal grandmother; Heart disease in her father; Hypertension in her father and mother; Prostate cancer in her paternal grandfather.  Social History: She has worked on cutting back on her cigarette intake - she is down to smoking 5 cigarettes per day. The nicotine gum has been helping with her cravings. She stopped using the patch because it made her feel nervous and jittery. She has a daughter going off to college, has a financial incentive at work to quit, and has a supportive family. Denies alcohol and illicit drug use.  Diet: Eats 1 meal a day ~2:30pm before work. Occasionally eats yogurt or bagel for breakfast. Low sodium diet. 2-3 cups of coffee each day.   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 HR: 68-71bpm.   Wt Readings from Last 3 Encounters:  06/25/17 180 lb 3.2 oz (81.7 kg)  06/09/17 179 lb (81.2 kg)  11/28/15 165 lb (74.8 kg)   BP Readings from Last 3 Encounters:  07/28/17 (!) 136/100  07/06/17 (!) 158/110  07/02/17 (!) 138/102   Pulse Readings from Last 3 Encounters:  07/28/17 72  07/06/17 78  06/25/17 100    Renal function: CrCl cannot be calculated (Patient's most recent lab result is older than the maximum 21 days allowed.).  Past Medical History:  Diagnosis Date  . Arthritis   . Chicken pox   . Frequent headaches   . GERD (gastroesophageal reflux disease)   .  Hypertension   . Stroke ([place])   . Syncope     Current Outpatient Prescriptions on File Prior to Visit  Medication Sig Dispense Refill  . carvedilol (COREG) 12.5 MG tablet Take 1 tablet (12.5 mg total) by mouth 2 (two) times daily. 60 tablet 11  . losartan (COZAAR) 100 MG tablet Take 1 tablet (100 mg total) by mouth daily. 30 tablet 1  . nicotine polacrilex (NICORETTE) 4 MG gum Take 1 each (4 mg total) by mouth as needed for smoking cessation. 100 each 1   No current facility-administered medications on file prior to visit.     Allergies  Allergen Reactions  . Sulfa Drugs Cross Reactors Anaphylaxis, Swelling and Rash     Assessment/Plan:  1. Hypertension: BP is above goal of <130/37mmHg. She is tolerating medications well, but believes work stress may be contributing to high BP. She has also been using high dose ibuprofen for the past 4 days. Plan to start spironolactone 12.5mg  daily and continue carvedilol 12.5mg  BID and losartan 100mg  daily. Check BMET in 1 week to make sure SCr and K remain stable with addition of spironolactone. Encouraged pt to continue low salt diet and work on smoking cessation. Will f/u with pt in HTN clinic in 2 weeks.   2. Smoking cessation: Pt states that she is still smoking 4-5 cigarettes a day. She is discouraged with her lack of progress, but contributes it to stress at work.  Pt is still motivated to quit and plans to continue using nicotine gum to help with cravings.   Pt seen with pharmacy student: Tierra Bonita. Supple, PharmD, CPP, [facility] [address] [place], [place] [address] Phone: [phone]; Fax: [phone] 08/25/2017 5:09 PM

## 2017-08-25 NOTE — Patient Instructions (Signed)
Thank you for coming to see [place] today!  Please continue taking losartan 100mg  daily, carvedilol 12.5mg  twice a day, and start taking spironolactone 12.5mg  daily.   Continue working on cutting back on smoking (you can do it!)  We will follow up with labs in 1 week, and recheck your blood pressure in 2 weeks.   Please continue checking blood pressure at home.

## 2017-08-28 ENCOUNTER — Telehealth: Payer: Self-pay | Admitting: Pharmacist

## 2017-08-28 MED ORDER — LOSARTAN POTASSIUM 100 MG PO TABS: 100.0000 mg | ORAL_TABLET | Freq: Every day | ORAL | 3 refills | Status: DC

## 2017-08-28 NOTE — Telephone Encounter (Signed)
New Message  Pt c/o medication issue:  1. Name of Medication: Spironolactone  2. How are you currently taking this medication (dosage and times per day)? 25mg    3. Are you having a reaction (difficulty breathing--STAT)? Per pt pulse dropping   4. What is your medication issue? Per pt would like to discuss with RN. Please call back to discuss

## 2017-08-28 NOTE — Telephone Encounter (Signed)
Returned call to pt. She reports BP today was 131/105, HR 68. After her AM medications, her BP was 130/100, HR 60. She is inquiring if her HR is dropping too low. Advised her that HR is still normal and that the only medication she takes that would lower her BP is the carvedilol but we did not adjust the dose of carvedilol at her last visit.  She also reports feeling nervous and jittery. She has been taking losartan, spironolactone, and carvedilol in the AM and just carvedilol PM dose in the evening. Advised pt to move her losartan to the evening to help space apart her BP medications and see if this improves her symptoms. Pt is in agreement with plan and will keep f/u appt in HTN clinic.

## 2017-09-01 ENCOUNTER — Encounter (HOSPITAL_COMMUNITY): Payer: Self-pay | Admitting: Emergency Medicine

## 2017-09-01 ENCOUNTER — Other Ambulatory Visit: Payer: 59

## 2017-09-01 ENCOUNTER — Ambulatory Visit (HOSPITAL_COMMUNITY)
Admission: EM | Admit: 2017-09-01 | Discharge: 2017-09-01 | Disposition: A | Discharge status: Home / Self Care | Payer: 59 | Attending: Family Medicine | Admitting: Family Medicine

## 2017-09-01 DIAGNOSIS — S70361A Insect bite (nonvenomous), right thigh, initial encounter: Secondary | ICD-10-CM

## 2017-09-01 DIAGNOSIS — W57XXXA Bitten or stung by nonvenomous insect and other nonvenomous arthropods, initial encounter: Secondary | ICD-10-CM

## 2017-09-01 MED ORDER — TRIAMCINOLONE ACETONIDE 0.1 % EX CREA: 1.0000 "application " | TOPICAL_CREAM | Freq: Two times a day (BID) | CUTANEOUS | 0 refills | Status: DC

## 2017-09-01 NOTE — ED Triage Notes (Signed)
PT reports large, red, swollen area to right upper thigh. PT noticed it yesterday. She is using benadryl cream

## 2017-09-01 NOTE — Discharge Instructions (Signed)
Problem triamcinolone cream to the area of redness and itching twice a day. May also apply Benadryl cream or gel every 4 hours. Apply ice packs over the area today and tomorrow off and on. If there develops any drainage such as pus or fluid under the skin or red streaks, feeling feverish or sickly seek medical attention promptly.

## 2017-09-01 NOTE — ED Provider Notes (Signed)
[facility]    CSN: [id number] Arrival date & time: 09/01/17  1515     History   Chief Complaint Chief Complaint  Patient presents with  . Insect Bite    HPI Debra Wade is a 46 y.o. female.   82-year-old female presents to the urgent care after discovering an itchy, red, warm, sore area to the right anterior thigh. She believes that she was bitten by an insect. The reaction is local. Denies systemic symptoms.      Past Medical History:  Diagnosis Date  . Arthritis   . Chicken pox   . Frequent headaches   . GERD (gastroesophageal reflux disease)   . Hypertension   . Stroke ([place])   . Syncope     Patient Active Problem List   Diagnosis Date Noted  . Tobacco abuse 07/06/2017  . Essential hypertension 06/09/2017  . Fatigue 06/09/2017  . Snoring 06/09/2017    Past Surgical History:  Procedure Laterality Date  . TUBAL LIGATION      OB History    No data available       Home Medications    Prior to Admission medications   Medication Sig Start Date End Date Taking? Authorizing Provider  carvedilol (COREG) 12.5 MG tablet Take 1 tablet (12.5 mg total) by mouth 2 (two) times daily. 07/28/17 07/23/18 Yes Turner, Eber Hong, MD  losartan (COZAAR) 100 MG tablet Take 1 tablet (100 mg total) by mouth daily. 08/28/17  Yes Turner, Eber Hong, MD  spironolactone (ALDACTONE) 25 MG tablet Take 0.5 tablets (12.5 mg total) by mouth daily. 08/25/17 11/23/17 Yes Turner, Eber Hong, MD  nicotine polacrilex (NICORETTE) 4 MG gum Take 1 each (4 mg total) by mouth as needed for smoking cessation. 07/15/17   Sueanne Margarita, MD  triamcinolone cream (KENALOG) 0.1 % Apply 1 application topically 2 (two) times daily. 09/01/17   Janne Napoleon, NP    Family History Family History  Problem Relation Age of Onset  . Hypertension Mother   . Hypertension Father   . Heart disease Father   . Breast cancer Maternal Grandmother   . Prostate cancer Paternal Grandfather     Social  History Social History  Substance Use Topics  . Smoking status: Current Every Day Smoker    Packs/day: 0.30    Years: 24.00    Types: Cigarettes  . Smokeless tobacco: Never Used  . Alcohol use Yes     Comment: occ.      Allergies   Sulfa drugs cross reactors   Review of Systems Review of Systems  Constitutional: Negative.   HENT: Negative.   Respiratory: Negative for cough and shortness of breath.   Cardiovascular: Negative for chest pain, palpitations and leg swelling.  Gastrointestinal: Negative.   Skin: Positive for color change.  Neurological: Negative.   All other systems reviewed and are negative.    Physical Exam Triage Vital Signs ED Triage Vitals  Enc Vitals Group     BP 09/01/17 1619 (!) 170/116     Pulse Rate 09/01/17 1619 80     Resp 09/01/17 1619 16     Temp 09/01/17 1619 98.3 F (36.8 C)     Temp Source 09/01/17 1619 Oral     SpO2 09/01/17 1619 100 %     Weight 09/01/17 1617 178 lb (80.7 kg)     Height 09/01/17 1617 5\' 9"  (1.753 m)     Head Circumference --      Peak Flow --  Pain Score 09/01/17 1618 4     Pain Loc --      Pain Edu? --      Excl. in [place]? --    No data found.   Updated Vital Signs BP (!) 170/116   Pulse 80   Temp 98.3 F (36.8 C) (Oral)   Resp 16   Ht 5\' 9"  (1.753 m)   Wt 178 lb (80.7 kg)   LMP 08/22/2017   SpO2 100%   BMI 26.29 kg/m   Visual Acuity Right Eye Distance:   Left Eye Distance:   Bilateral Distance:    Right Eye Near:   Left Eye Near:    Bilateral Near:     Physical Exam  Constitutional: She is oriented to person, place, and time. She appears well-developed and well-nourished. No distress.  Eyes: EOM are normal.  Neck: Normal range of motion. Neck supple.  Cardiovascular: Normal rate.   Pulmonary/Chest: Effort normal. No respiratory distress.  Musculoskeletal: She exhibits no deformity.  Neurological: She is alert and oriented to person, place, and time.  Skin: Skin is warm and dry. No rash  noted.  Right eye with a roughly annular blotchy area of erythema approximately 3.5 cm in diameter located to the right anterior thigh. It is surrounded by an underlying area of induration extending another 2 cm from the erythema. Mildly warm to touch. No lymphangitis. No drainage, bleeding or purulence.  Psychiatric: She has a normal mood and affect.  Nursing note and vitals reviewed.    UC Treatments / Results  Labs (all labs ordered are listed, but only abnormal results are displayed) Labs Reviewed - No data to display  EKG  EKG Interpretation None       Radiology No results found.  Procedures Procedures (including critical care time)  Medications Ordered in UC Medications - No data to display   Initial Impression / Assessment and Plan / UC Course  I have reviewed the triage vital signs and the nursing notes.  Pertinent labs & imaging results that were available during my care of the patient were reviewed by me and considered in my medical decision making (see chart for details).    Problem triamcinolone cream to the area of redness and itching twice a day. May also apply Benadryl cream or gel every 4 hours. Apply ice packs over the area today and tomorrow off and on. If there develops any drainage such as pus or fluid under the skin or red streaks, feeling feverish or sickly seek medical attention promptly.     Final Clinical Impressions(s) / UC Diagnoses   Final diagnoses:  Insect bite, initial encounter    New Prescriptions New Prescriptions   TRIAMCINOLONE CREAM (KENALOG) 0.1 %    Apply 1 application topically 2 (two) times daily.     Controlled Substance Prescriptions [place] Controlled Substance Registry consulted? Not Applicable   Janne Napoleon, NP 09/01/17 1714

## 2017-09-02 ENCOUNTER — Other Ambulatory Visit: Payer: 59 | Admitting: *Deleted

## 2017-09-02 DIAGNOSIS — I10 Essential (primary) hypertension: Secondary | ICD-10-CM

## 2017-09-03 LAB — BASIC METABOLIC PANEL
BUN/Creatinine Ratio: 15 (ref 9–23)
BUN: 12 mg/dL (ref 6–24)
CALCIUM: 9.4 mg/dL (ref 8.7–10.2)
CO2: 22 mmol/L (ref 20–29)
CREATININE: 0.82 mg/dL (ref 0.57–1.00)
Chloride: 103 mmol/L (ref 96–106)
GFR calc Af Amer: 101 mL/min/{1.73_m2} (ref >59)
GFR, EST NON AFRICAN AMERICAN: 87 mL/min/{1.73_m2} (ref >59)
Glucose: 93 mg/dL (ref 65–99)
Potassium: 4.3 mmol/L (ref 3.5–5.2)
Sodium: 139 mmol/L (ref 134–144)

## 2017-09-12 NOTE — Progress Notes (Deleted)
Patient ID: Debra Wade                 DOB: June 01, 1971                      MRN: [id number]     HPI: Debra Wade is a 46 y.o. female referred by Dr. Radford Pax to HTN clinic. PMH is significant for HTN, CVA, and headaches. She was recently referred by PCP to cardiology for management of HTN. She had taken losartan-HCTZ and metoprolol in the past but had some problems with syncope and stopped taking her medications. Sheexperienced LEE on amlodipine. She has had HTN since 2009 as well as a history of preeclampsia. Because her BP has remained elevated for years, she is sensitive to changes in her HTN regimen. She reports that it took her 2 weeks for her body to adjust to the dose increase of losartan from 50mg  to 100mg  daily. 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 On 07/28/2017 visit, carvedilol dosing was increased to 12.5mg  BID.Spironolactone added at last appointment on 08/25/17, with baseline BMET wnl. After starting, pt reported feeling jittery but BP remained ~130/100 mmHg so encouraged pt to move losartan dose to the evening to space out BP medications.  Pt presents to clinic today *** F/U smoking *** and ibuprofen ***  Current HTN meds: carvedilol 12.5mg  BID, losartan 100mg  daily, spironolactone 12.5mg  daily  Previously tried: amlodipine - LEE, hydrochlorothiazide- syncope  BP goal: <130/32mmHg  Family History: The patient's family history includes Breast cancer in her maternal grandmother; Heart disease in her father; Hypertension in her father and mother; Prostate cancer in her paternal grandfather.  Social History: She has worked on cutting back on her cigarette intake - she is down to smoking 5 cigarettes per day. The nicotine gum has been helping with her cravings. She stopped using the patch because it made her feel nervous  and jittery. She has a daughter going off to college, has a financial incentive at work to quit, and has a supportive family. Denies alcohol and illicit drug use.  Diet: Eats 1 meal a day ~2:30pm before work. Occasionally eats yogurt or bagel for breakfast. Low sodium diet. 2-3 cups of coffee each day.   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  Past Medical History:  Diagnosis Date  . Arthritis   . Chicken pox   . Frequent headaches   . GERD (gastroesophageal reflux disease)   . Hypertension   . Stroke ([place])   . Syncope     Current Outpatient Prescriptions on File Prior to Visit  Medication Sig Dispense Refill  . carvedilol (COREG) 12.5 MG tablet Take 1 tablet (12.5 mg total) by mouth 2 (two) times daily. 60 tablet 11  . losartan (  COZAAR) 100 MG tablet Take 1 tablet (100 mg total) by mouth daily. 90 tablet 3  . nicotine polacrilex (NICORETTE) 4 MG gum Take 1 each (4 mg total) by mouth as needed for smoking cessation. 100 each 1  . spironolactone (ALDACTONE) 25 MG tablet Take 0.5 tablets (12.5 mg total) by mouth daily. 15 tablet 2  . triamcinolone cream (KENALOG) 0.1 % Apply 1 application topically 2 (two) times daily. 30 g 0   No current facility-administered medications on file prior to visit.     Allergies  Allergen Reactions  . Sulfa Drugs Cross Reactors Anaphylaxis, Swelling and Rash     Assessment/Plan:  1. Hypertension -

## 2017-09-14 ENCOUNTER — Ambulatory Visit (INDEPENDENT_AMBULATORY_CARE_PROVIDER_SITE_OTHER): Payer: 59 | Admitting: Pharmacist

## 2017-09-14 ENCOUNTER — Encounter: Payer: Self-pay | Admitting: Pharmacist

## 2017-09-14 ENCOUNTER — Ambulatory Visit: Payer: 59

## 2017-09-14 VITALS — BP 152/104 | HR 81

## 2017-09-14 DIAGNOSIS — Z720 Tobacco use: Secondary | ICD-10-CM | POA: Diagnosis not present

## 2017-09-14 DIAGNOSIS — I10 Essential (primary) hypertension: Secondary | ICD-10-CM

## 2017-09-14 MED ORDER — SPIRONOLACTONE 25 MG PO TABS: 25.0000 mg | ORAL_TABLET | Freq: Every day | ORAL | 11 refills | Status: DC

## 2017-09-14 NOTE — Progress Notes (Signed)
Patient ID: ZITLALY MALSON                 DOB: 20-Feb-1971                      MRN: [id number]     HPI: Debra Wade is a 46 y.o. female referred by Dr. Radford Pax to HTN clinic. PMH is significant for HTN, CVA, and headaches. She was recently referred by PCP to cardiology for management of HTN. She had taken losartan-HCTZ and metoprolol in the past but had some problems with syncope and stopped taking her medications. Sheexperienced LEE on amlodipine. She has had HTN since 2009 as well as a history of preeclampsia. Because her BP has remained elevated for years, she is sensitive to changes in her HTN regimen. She reports that it took her 2 weeks for her body to adjust to the dose increase of losartan from 50mg  to 100mg  daily.Titrating medications slowly due to pt sensitivity. 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 At last visit, spironolactone 12.5mg  daily was started. F/u BMET was stable. She remains motivated to lower her BP and quit smoking.   Pt reports tolerating spironolactone well. She has been urinating more frequently. She stopped using ibuprofen since her last visit and has been feeling less jittery since moving her losartan to the evening. 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 She has still been smoking due to stress in her personal life. Her daughter is living on the street and won't come home, her son is in jail and was recently told that he may have cancer, and her 65 year old daughter now has 3 grandchildren. She verbalizes that medication to help with anxiety and stress would  help to lower her blood pressure. Her home BP readings have been ~170/120. The lowest diastolic reading she has seen was 118.  Current HTN meds: carvedilol 12.5mg  BID, losartan 100mg  daily, spironolactone 12.5mg  daily Previously tried: amlodipine - LEE, hydrochlorothiazide- syncope BP goal: <130/56mmHg  Family History: The patient's family history includes Breast cancer in her maternal grandmother; Heart disease in her father; Hypertension in her father and mother; Prostate cancer in her paternal grandfather.  Social History: She has worked on cutting back on her cigarette intake - she is down to smoking 5 cigarettes per day. The nicotine gum has been helping with her cravings. She stopped using the patch because it made her feel nervous and jittery. She has a daughter going off to college, has a financial incentive at work to quit, and has a supportive family. Denies alcohol and illicit drug use.  Diet: Eats 1 meal a day ~2:30pm before work. Occasionally eats yogurt or bagel for breakfast. Low sodium diet. 2-3 cups of coffee each day.   Exercise: Likes to walk.   Wt Readings from Last 3 Encounters:  09/01/17 178 lb (80.7 kg)  06/25/17 180 lb 3.2 oz (81.7 kg)  06/09/17 179 lb (81.2 kg)   BP Readings from Last 3 Encounters:  09/01/17 (!) 170/116  08/25/17 (!) 156/106  07/28/17 (!) 136/100   Pulse Readings from Last 3 Encounters:  09/01/17 80  08/25/17 71  07/28/17 72    Renal function: Estimated Creatinine Clearance: 99.5 mL/min (by C-G formula based on SCr of 0.82 mg/dL).  Past Medical History:  Diagnosis Date  . Arthritis   . Chicken pox   . Frequent headaches   . GERD (gastroesophageal reflux disease)   . Hypertension   . Stroke ([place])   . Syncope     Current Outpatient Prescriptions on File Prior to Visit  Medication Sig Dispense Refill  . carvedilol (COREG) 12.5 MG tablet Take 1 tablet (12.5 mg total) by mouth 2 (two) times daily. 60 tablet 11  . losartan  (COZAAR) 100 MG tablet Take 1 tablet (100 mg total) by mouth daily. 90 tablet 3  . nicotine polacrilex (NICORETTE) 4 MG gum Take 1 each (4 mg total) by mouth as needed for smoking cessation. 100 each 1  . spironolactone (ALDACTONE) 25 MG tablet Take 0.5 tablets (12.5 mg total) by mouth daily. 15 tablet 2  . triamcinolone cream (KENALOG) 0.1 % Apply 1 application topically 2 (two) times daily. 30 g 0   No current facility-administered medications on file prior to visit.     Allergies  Allergen Reactions  . Sulfa Drugs Cross Reactors Anaphylaxis, Swelling and Rash     Assessment/Plan:  1. Hypertension - BP is improved but remains above goal <130/24mmHg. Will increase spironolactone to 25mg  daily and continue losartan 100mg  daily and carvedilol 12.5mg  BID. Pt prefers slow medication titration due to previous intolerances with medication changes. Personal stress is also contributing to elevated BP readings. Encouraged pt to find an outlet to help with her anxiety. She is religious and goes to church frequently. Encouraged pt to keep f/u with PCP as well in 1 month - anxiolytics will likely help with stress-induced hypertension.  F/u in HTN clinic in 2 weeks for BMET and BP check.   Debra Wade, PharmD, CPP, [facility] [address], [place], [place] [address] Phone: [phone]; Fax: [phone] 09/14/2017 4:17 PM

## 2017-09-14 NOTE — Patient Instructions (Signed)
It was nice to see you today  Increase your spironolactone to 25mg  (1 full tablet) each day  Continue taking your carvedilol and losartan  Follow up for blood pressure check and lab work in 2 weeks.

## 2017-09-15 ENCOUNTER — Ambulatory Visit (INDEPENDENT_AMBULATORY_CARE_PROVIDER_SITE_OTHER): Payer: 59 | Admitting: Family Medicine

## 2017-09-15 ENCOUNTER — Telehealth: Payer: Self-pay | Admitting: *Deleted

## 2017-09-15 ENCOUNTER — Encounter: Payer: Self-pay | Admitting: Family Medicine

## 2017-09-15 VITALS — BP 160/120 | HR 75 | Temp 98.0°F | Ht 69.0 in | Wt 177.0 lb

## 2017-09-15 DIAGNOSIS — I10 Essential (primary) hypertension: Secondary | ICD-10-CM | POA: Diagnosis not present

## 2017-09-15 NOTE — Progress Notes (Signed)
Debra Wade - 46 y.o. female MRN [id number]  Date of birth: [birth year]  SUBJECTIVE:  Including CC & ROS.  Chief Complaint  Patient presents with  . Hypertension    [name] states that she has went through a lot of different BP medication for the last several months. Pt states that there may be more external causes to her elevated BP.     Debra Wade is a 46 year old female is presenting in following up for high blood pressure. She denies any shortness of breath, lightheadedness or leg swelling. She denies getting a good night sleep through the course of the night and does snore. He has not had a previous sleep study. Reports she became hypertensive in 2009 after she had her last child. Since that time the blood pressure has gradually increased despite treatment strategies. She has recently switched to a new job where she has better hours and doesn't have to work all different shifts.   She was seen at the cardiologist's office yesterday and they increased her spironolactone and continue the current losartan and Coreg dose.  Ultrasound of her kidneys from 2017 shows a hemorrhagic cyst but no hydronephrosis.  CT renal stone study from 2016 shows a 5 mm obstructive stone and clonic diverticulosis.  Review of Systems  Constitutional: Negative for fever.  Respiratory: Negative for shortness of breath.   Cardiovascular: Negative for chest pain.    HISTORY: Past Medical, Surgical, Social, and Family History Reviewed & Updated per EMR.   Pertinent Historical Findings include:  Past Medical History:  Diagnosis Date  . Arthritis   . Chicken pox   . Frequent headaches   . GERD (gastroesophageal reflux disease)   . Hypertension   . Stroke ([place])   . Syncope     Past Surgical History:  Procedure Laterality Date  . TUBAL LIGATION      Allergies  Allergen Reactions  . Sulfa Drugs Cross Reactors Anaphylaxis, Swelling and Rash    Family History  Problem Relation Age of Onset  .  Hypertension Mother   . Hypertension Father   . Heart disease Father   . Breast cancer Maternal Grandmother   . Prostate cancer Paternal Grandfather      Social History   Social History  . Marital status: Single    Spouse name: N/A  . Number of children: 5  . Years of education: 36   Occupational History  . [employer]    Social History Main Topics  . Smoking status: Current Every Day Smoker    Packs/day: 0.30    Years: 24.00    Types: Cigarettes  . Smokeless tobacco: Never Used  . Alcohol use Yes     Comment: occ.   . Drug use: No  . Sexual activity: Not on file   Other Topics Concern  . Not on file   Social History Narrative   Fun/Hobby: [place], anything relaxing   Denies abuse and feels safe at home.     PHYSICAL EXAM:  VS: BP (!) 160/120 (BP Location: Left Arm, Patient Position: Sitting, Cuff Size: Large)   Pulse 75   Temp 98 F (36.7 C) (Oral)   Ht 5\' 9"  (1.753 m)   Wt 177 lb (80.3 kg)   LMP 08/22/2017   SpO2 98%   BMI 26.14 kg/m  Physical Exam Gen: NAD, alert, cooperative with exam, well-appearing ENT: normal lips, normal nasal mucosa,  Eye: normal EOM, normal conjunctiva and lids CV:  no edema, +2 pedal pulses, regular  in rhythm, S1-S2   Resp: no accessory muscle use, non-labored, clear auscultation bilaterally GI: no masses or tenderness, no hernia  Skin: no rashes, no areas of induration  Neuro: normal tone, normal sensation to touch Psych:  normal insight, alert and oriented MSK: Normal gait, normal strength      ASSESSMENT & PLAN:    Essential hypertension Resistant hypertension despite workup. Unclear if she has had a renal duplex performed or urine catecholamines. Has not had a sleep study performed yet. It does not appear that she is overly anxious but always keep this as a contributor. - Order a sleep study today. - If normal consider starting something for anxiety.

## 2017-09-15 NOTE — Assessment & Plan Note (Signed)
Resistant hypertension despite workup. Unclear if she has had a renal duplex performed or urine catecholamines. Has not had a sleep study performed yet. It does not appear that she is overly anxious but always keep this as a contributor. - Order a sleep study today. - If normal consider starting something for anxiety.

## 2017-09-15 NOTE — Patient Instructions (Signed)
Thank you for coming in,   We will see what the sleep study shows.    Please feel free to call with any questions or concerns at any time, at [phone]. --Dr. Raeford Razor

## 2017-09-15 NOTE — Telephone Encounter (Signed)
Return certified mail on 73/73/66

## 2017-09-28 ENCOUNTER — Other Ambulatory Visit: Payer: 59 | Admitting: *Deleted

## 2017-09-28 ENCOUNTER — Ambulatory Visit (INDEPENDENT_AMBULATORY_CARE_PROVIDER_SITE_OTHER): Payer: 59 | Admitting: Pharmacist

## 2017-09-28 VITALS — BP 138/86 | HR 78

## 2017-09-28 DIAGNOSIS — I10 Essential (primary) hypertension: Secondary | ICD-10-CM

## 2017-09-28 MED ORDER — SPIRONOLACTONE 50 MG PO TABS: 50.0000 mg | ORAL_TABLET | Freq: Every day | ORAL | 11 refills | Status: DC

## 2017-09-28 NOTE — Progress Notes (Signed)
Patient ID: JOVON STREETMAN                 DOB: [birth year]                      MRN: [id number]     HPI: Debra Kingsford Herbinis a 46 y.o.femalereferred by Dr. Carlye Grippe HTN clinic.PMH is significant for HTN, CVA, and headaches. She was recently referred by PCP to cardiology for management of HTN. She had taken losartan-HCTZ and metoprolol in the past but had some problems with syncope and stopped taking her medications. Sheexperienced LEE on amlodipine. She has had HTN since 2009 as well as a history of preeclampsia. Because her BP has remained elevated for years, she is sensitive to changes in her HTN regimen. She reports that it took her 2 weeks for her body to adjust to the dose increase of losartan from 50mg  to 100mg  daily.Titrating medications slowly due to pt sensitivity. 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 At last visit, spironolactone was increased to 25mg  daily.   Pt presents today in good spirits. She denies dizziness, blurred vision, headache, or falls. She is liking her new job much better and has a lower level of stress. Her sleeping schedule is more regular as well and she feels more well rested. She has tried a vinegar drink that she heard could lower her blood pressure and it helped her to feel better. She has not checked her BP at home over fear of the readings being high.  Current HTN meds:carvedilol 12.5mg  BID, losartan 100mg  daily, spironolactone 25mg  daily Previously tried:amlodipine - LEE, hydrochlorothiazide- syncope BP goal: <130/40mmHg  Family History: The patient's family history includes Breast cancer in her maternal grandmother; Heart disease  in her father; Hypertension in her father and mother; Prostate cancer in her paternal grandfather.  Social History: She has worked on cutting back on her cigarette intake - she is down to smoking 5 cigarettes per day. The nicotine gum has been helping with her cravings. She stopped using the patch because it made her feel nervous and jittery. She has a daughter going off to college, has a financial incentive at work to quit, and has a supportive family. Denies alcohol and illicit drug use.  Diet:Eats 1 meal a day ~2:30pm before work. Occasionally eats yogurt or bagel for breakfast. Low sodium diet. 2-3 cups of coffee each day.   Exercise:Likes to walk.  Wt Readings from Last 3 Encounters:  09/15/17 177 lb (80.3 kg)  09/01/17 178 lb (80.7 kg)  06/25/17 180 lb 3.2 oz (81.7 kg)   BP Readings from Last 3 Encounters:  09/15/17 (!) 160/120  09/14/17 (!) 152/104  09/01/17 (!) 170/116   Pulse Readings from Last 3 Encounters:  09/15/17 75  09/14/17 81  09/01/17 80    Renal function: CrCl cannot be calculated (Patient's most recent lab result is older than the maximum 21 days allowed.).  Past Medical History:  Diagnosis Date  . Arthritis   . Chicken pox   . Frequent headaches   . GERD (gastroesophageal reflux disease)   . Hypertension   . Stroke ([place])   .  Syncope     Current Outpatient Prescriptions on File Prior to Visit  Medication Sig Dispense Refill  . carvedilol (COREG) 12.5 MG tablet Take 1 tablet (12.5 mg total) by mouth 2 (two) times daily. 60 tablet 11  . losartan (COZAAR) 100 MG tablet Take 1 tablet (100 mg total) by mouth daily. 90 tablet 3  . nicotine polacrilex (NICORETTE) 4 MG gum Take 1 each (4 mg total) by mouth as needed for smoking cessation. 100 each 1  . spironolactone (ALDACTONE) 25 MG tablet Take 1 tablet (25 mg total) by mouth daily. 30 tablet 11  . triamcinolone cream (KENALOG) 0.1 % Apply 1 application topically 2 (two) times daily. 30 g 0   No  current facility-administered medications on file prior to visit.     Allergies  Allergen Reactions  . Sulfa Drugs Cross Reactors Anaphylaxis, Swelling and Rash     Assessment/Plan:  1. Hypertension - BP is much improved on higher dose of spironolactone 25mg  daily, although readings still remain above goal <130/20mmHg. Will check BMET today and if stable, plan to increase spironolactone to 50mg  daily. Continue carvedilol 12.5mg  BID and losartan 100mg  daily. Discussed that pt's better sleep hygiene and lower stress levels at work are also contributing to lower BP readings. Will f/u in HTN clinic in 2 weeks for BP check and BMET.   Debra Wade, PharmD, CPP, [facility] [address] [place], [place] [address] Phone: [phone]; Fax: [phone] 09/28/2017 3:05 PM

## 2017-09-28 NOTE — Patient Instructions (Addendum)
It was nice to see you today - your blood pressure is looking much better!  As long as your labs are stable, we will plan to increase your spironolactone to 50mg  daily. You can take 2 of your 25mg  tablets until you run out, then pick up your new prescription for the 50mg  tablets and take 1 each day.  Follow up in clinic in 2 weeks for blood pressure check

## 2017-09-29 LAB — BASIC METABOLIC PANEL
BUN / CREAT RATIO: 10 (ref 9–23)
BUN: 8 mg/dL (ref 6–24)
CO2: 24 mmol/L (ref 20–29)
Calcium: 9.1 mg/dL (ref 8.7–10.2)
Chloride: 102 mmol/L (ref 96–106)
Creatinine, Ser: 0.82 mg/dL (ref 0.57–1.00)
GFR calc Af Amer: 101 mL/min/{1.73_m2} (ref >59)
GFR calc non Af Amer: 87 mL/min/{1.73_m2} (ref >59)
GLUCOSE: 86 mg/dL (ref 65–99)
POTASSIUM: 3.8 mmol/L (ref 3.5–5.2)
SODIUM: 143 mmol/L (ref 134–144)

## 2017-10-12 ENCOUNTER — Other Ambulatory Visit: Payer: 59

## 2017-10-12 ENCOUNTER — Ambulatory Visit: Payer: 59

## 2017-10-12 NOTE — Progress Notes (Deleted)
Patient ID: Debra Wade                 DOB: July 23, 1971                      MRN: [id number]     HPI: Debra Wade is a 46 y.o. female referred by Dr. Radford Pax to HTN clinic. PMH is significant for HTN, CVA,  Headaches, and preeclampsia. Because her BP has remained elevated for years, she is sensitive to changes in her HTN regimen. She reports her BP in her right arm is higher than her left arm due to residual effects from her stroke ~20 years ago. 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 At last visit, spironolactone was increased to 50mg  daily.  Patient presents today in good spirits. She denies dizziness, blurred vision, headache, or falls. She reports tolerating spironolactone well. She has been urinating more frequently. She continues to drink the vinegar drink as she feels that it is helping her feel better.  Current HTN meds:carvedilol 12.5mg  BID, losartan 100mg  daily, spironolactone 25mg  daily Previously tried:amlodipine - LEE, hydrochlorothiazide- syncope, metoprolol - syncope BP goal: <130/80mmHg  Family History: The patient's family history includes Breast cancer in her maternal grandmother; Heart disease in her father; Hypertension in her father and mother; Prostate cancer in her paternal grandfather.  Social History: She has worked on cutting back on her cigarette intake - she is down to smoking 5 cigarettes per day. The nicotine gum has been helping with her cravings. She stopped using the patch because it made her feel nervous and jittery. She has a daughter going off to college, has a financial incentive at work to quit, and has a supportive family. Denies alcohol and illicit drug use.  Diet:Eats 1 meal a day ~2:30pm before work. Occasionally eats yogurt or bagel for breakfast. Low sodium diet. 2-3 cups of coffee each day.   Exercise:Likes to walk.  Home BP readings: She has not  checked her BP at home over fear of the readings being high.  Wt Readings from Last 3 Encounters:  09/15/17 177 lb (80.3 kg)  09/01/17 178 lb (80.7 kg)  06/25/17 180 lb 3.2 oz (81.7 kg)   BP Readings from Last 3 Encounters:  09/28/17 138/86  09/15/17 (!) 160/120  09/14/17 (!) 152/104   Pulse Readings from Last 3 Encounters:  09/28/17 78  09/15/17 75  09/14/17 81    Renal function: CrCl cannot be calculated (Unknown ideal weight.).  Past Medical History:  Diagnosis Date  . Arthritis   . Chicken pox   . Frequent headaches   . GERD (gastroesophageal reflux disease)   . Hypertension   . Stroke ([place])   . Syncope     Current Outpatient Medications on File Prior to Visit  Medication Sig Dispense Refill  . carvedilol (COREG) 12.5 MG tablet Take 1 tablet (12.5 mg total) by mouth 2 (two) times daily. 60 tablet 11  . losartan (COZAAR) 100 MG tablet Take 1 tablet (100 mg total) by mouth daily. 90 tablet 3  . nicotine polacrilex (NICORETTE) 4 MG gum Take 1 each (4 mg total) by mouth as needed for smoking cessation. 100 each 1  . spironolactone (ALDACTONE) 50 MG tablet Take 1 tablet (50 mg total) by mouth daily. 30 tablet 11  . triamcinolone cream (KENALOG) 0.1 % Apply 1 application topically 2 (two) times daily.  30 g 0   No current facility-administered medications on file prior to visit.     Allergies  Allergen Reactions  . Sulfa Drugs Cross Reactors Anaphylaxis, Swelling and Rash     Assessment/Plan:  1. Hypertension - BP is improved but remains above goal < 130/60mmHg. Continue carvedilol 12.5mg  twice daily, losartan 100mg  daily, and spironolactone 50mg  daily.

## 2017-10-13 ENCOUNTER — Other Ambulatory Visit: Payer: 59

## 2017-10-21 ENCOUNTER — Ambulatory Visit: Payer: 59 | Admitting: Nurse Practitioner

## 2017-11-02 ENCOUNTER — Other Ambulatory Visit: Payer: 59 | Admitting: *Deleted

## 2017-11-02 DIAGNOSIS — I10 Essential (primary) hypertension: Secondary | ICD-10-CM

## 2017-11-02 NOTE — Progress Notes (Deleted)
Patient ID: Debra Wade                 DOB: August 27, 1971                      MRN: [id number]     HPI: Debra Wade is a 46 y.o. female referred by Dr. Carlye Grippe HTN clinic.PMH is significant for HTN, CVA, and headaches. She was recently referred by PCP to cardiology for management of HTN. She had taken losartan-HCTZ and metoprolol in the past but had some problems with syncope and stopped taking her medications. Sheexperienced LEE on amlodipine. She has had HTN since 2009 as well as a history of preeclampsia. Because her BP has remained elevated for years, she is sensitive to changes in her HTN regimen. She reports that it took her 2 weeks for her body to adjust to the dose increase of losartan from 50mg  to 100mg  daily.Titrating medications slowly due to pt sensitivity. 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 At last visit, spironolactone was increased to 50mg  daily.   Pt presents *** Debra Wade 52 *** BMET *** Not checking home BP ***  Current HTN meds:carvedilol 12.5mg  BID, losartan 100mg  daily, spironolactone 50mg  daily Previously tried:amlodipine - LEE, hydrochlorothiazide- syncope BP goal: <130/63mmHg  Family History: The patient's family history includes Breast cancer in her maternal grandmother; Heart disease in her father; Hypertension in her father and mother; Prostate cancer in her paternal grandfather.  Social History: She has worked on cutting back on her cigarette intake - she is down to smoking 5 cigarettes per day. The nicotine gum has been helping with her cravings. She stopped using the patch because it made her feel nervous and jittery. She has a  daughter going off to college, has a financial incentive at work to quit, and has a supportive family. Denies alcohol and illicit drug use.  Diet:Eats 1 meal a day ~2:30pm before work. Occasionally eats yogurt or bagel for breakfast. Low sodium diet. 2-3 cups of coffee each day.   Exercise:Likes to walk.  Home BP readings: ***  Wt Readings from Last 3 Encounters:  09/15/17 177 lb (80.3 kg)  09/01/17 178 lb (80.7 kg)  06/25/17 180 lb 3.2 oz (81.7 kg)   BP Readings from Last 3 Encounters:  09/28/17 138/86  09/15/17 (!) 160/120  09/14/17 (!) 152/104   Pulse Readings from Last 3 Encounters:  09/28/17 78  09/15/17 75  09/14/17 81    Renal function: CrCl cannot be calculated (Patient's most recent lab result is older than the maximum 21 days allowed.).  Past Medical History:  Diagnosis Date  . Arthritis   . Chicken pox   . Frequent headaches   . GERD (gastroesophageal reflux disease)   . Hypertension   . Stroke ([place])   . Syncope     Current Outpatient Medications on File Prior to Visit  Medication Sig Dispense Refill  . carvedilol (COREG) 12.5 MG tablet Take 1 tablet (12.5 mg total) by mouth 2 (two) times daily. 60 tablet 11  . losartan (COZAAR) 100 MG tablet Take 1 tablet (100 mg total) by mouth daily. 90 tablet  3  . nicotine polacrilex (NICORETTE) 4 MG gum Take 1 each (4 mg total) by mouth as needed for smoking cessation. 100 each 1  . spironolactone (ALDACTONE) 50 MG tablet Take 1 tablet (50 mg total) by mouth daily. 30 tablet 11  . triamcinolone cream (KENALOG) 0.1 % Apply 1 application topically 2 (two) times daily. 30 g 0   No current facility-administered medications on file prior to visit.     Allergies  Allergen Reactions  . Sulfa Drugs Cross Reactors Anaphylaxis, Swelling and Rash     Assessment/Plan:  1. Hypertension -

## 2017-11-03 ENCOUNTER — Ambulatory Visit: Payer: 59

## 2017-11-03 LAB — BASIC METABOLIC PANEL
BUN / CREAT RATIO: 10 (ref 9–23)
BUN: 9 mg/dL (ref 6–24)
CHLORIDE: 106 mmol/L (ref 96–106)
CO2: 22 mmol/L (ref 20–29)
Calcium: 9.2 mg/dL (ref 8.7–10.2)
Creatinine, Ser: 0.88 mg/dL (ref 0.57–1.00)
GFR, EST AFRICAN AMERICAN: 92 mL/min/{1.73_m2} (ref >59)
GFR, EST NON AFRICAN AMERICAN: 80 mL/min/{1.73_m2} (ref >59)
Glucose: 91 mg/dL (ref 65–99)
Potassium: 3.7 mmol/L (ref 3.5–5.2)
Sodium: 143 mmol/L (ref 134–144)

## 2017-11-03 NOTE — Progress Notes (Deleted)
Patient ID: Debra Wade                 DOB: Jun 26, 1971                      MRN: [id number]     HPI: Debra Wade is a 46 y.o. female patient of Dr. Radford Pax who presents today for hypertension follow up.  PMH is significant for HTN, CVA, and headaches. She was recently referred by PCP to cardiology for management of HTN. She had taken losartan-HCTZ and metoprolol in the past but had some problems with syncope and stopped taking her medications. She has had HTN since 2009 as well as a history of preeclampsia. Because her BP has remained elevated for years, she is sensitive to changes in her HTN regimen. She reports that it took her 2 weeks for her body to adjust to the dose increase of losartan from 50mg  to 100mg  daily.Titrating medications slowly due to pt sensitivity. 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 At last visit, her spironolactone was increased to 50mg  daily.   Metabolic panel from yesterday (after dose increase) was stable.   She presents today,    Current HTN meds:  Carvedilol 12.5mg  BID Losartan 100mg  daily Spironolactone 50mg  daily  Previously tried: amlodipine - LEE, hydrochlorothiazide - syncope  BP goal: <130/2mmHg  Family History: The patient's family history includes Breast cancer in her maternal grandmother; Heart disease in her father; Hypertension in her father and mother; Prostate cancer in her paternal grandfather.  Social History: She has worked on cutting back on her cigarette intake - she is down to smoking 5 cigarettes per day. The nicotine gum has been helping with her cravings. She stopped using the patch because it made her feel  nervous and jittery. She has a daughter going off to college, has a financial incentive at work to quit, and has a supportive family. Denies alcohol and illicit drug use.  Diet:Eats 1 meal a day ~2:30pm before work. Occasionally eats yogurt or bagel for breakfast. Low sodium diet. 2-3 cups of coffee each day.   Exercise:Likes to walk.  Home BP readings:   Wt Readings from Last 3 Encounters:  09/15/17 177 lb (80.3 kg)  09/01/17 178 lb (80.7 kg)  06/25/17 180 lb 3.2 oz (81.7 kg)   BP Readings from Last 3 Encounters:  09/28/17 138/86  09/15/17 (!) 160/120  09/14/17 (!) 152/104   Pulse Readings from Last 3 Encounters:  09/28/17 78  09/15/17 75  09/14/17 81    Renal function: CrCl cannot be calculated (Unknown ideal weight.).  Past Medical History:  Diagnosis Date  . Arthritis   . Chicken pox   . Frequent headaches   . GERD (gastroesophageal reflux disease)   . Hypertension   . Stroke ([place])   . Syncope     Current Outpatient Medications on File Prior to Visit  Medication Sig Dispense Refill  . carvedilol (COREG) 12.5 MG tablet Take 1 tablet (12.5 mg total) by mouth 2 (two) times daily. 60 tablet 11  . losartan (COZAAR) 100 MG tablet Take 1 tablet (100 mg total) by mouth  daily. 90 tablet 3  . nicotine polacrilex (NICORETTE) 4 MG gum Take 1 each (4 mg total) by mouth as needed for smoking cessation. 100 each 1  . spironolactone (ALDACTONE) 50 MG tablet Take 1 tablet (50 mg total) by mouth daily. 30 tablet 11  . triamcinolone cream (KENALOG) 0.1 % Apply 1 application topically 2 (two) times daily. 30 g 0   No current facility-administered medications on file prior to visit.     Allergies  Allergen Reactions  . Sulfa Drugs Cross Reactors Anaphylaxis, Swelling and Rash    There were no vitals taken for this visit.   Assessment/Plan: Hypertension: Increase Debra Wade?   Thank you, Debra Wade, [facility]  11/03/2017 8:34  AM

## 2017-11-19 ENCOUNTER — Ambulatory Visit (INDEPENDENT_AMBULATORY_CARE_PROVIDER_SITE_OTHER): Payer: 59 | Admitting: Pharmacist

## 2017-11-19 VITALS — BP 140/96 | HR 73

## 2017-11-19 DIAGNOSIS — Z720 Tobacco use: Secondary | ICD-10-CM

## 2017-11-19 DIAGNOSIS — I10 Essential (primary) hypertension: Secondary | ICD-10-CM | POA: Diagnosis not present

## 2017-11-19 MED ORDER — CARVEDILOL 25 MG PO TABS: 25.0000 mg | ORAL_TABLET | Freq: Two times a day (BID) | ORAL | 11 refills | Status: DC

## 2017-11-19 MED ORDER — NICOTINE 7 MG/24HR TD PT24: 7.0000 mg | MEDICATED_PATCH | Freq: Every day | TRANSDERMAL | 3 refills | Status: DC

## 2017-11-19 NOTE — Patient Instructions (Addendum)
It was nice to see you today  Please increase your carvedilol to 25mg  twice a day  Start using the nicotine patch 7mg  daily   Continue to use your nicotine gum for cravings  Follow up in clinic in 4 weeks

## 2017-11-19 NOTE — Progress Notes (Signed)
Patient ID: Debra Wade                 DOB: Apr 29, 1971                      MRN: [id number]     HPI: Debra Thurow Herbinis a 46 y.o.femalereferred by Dr. [facility] clinic.PMH is significant for HTN, CVA, and headaches. She was recently referred by PCP to cardiology for management of HTN. She had taken losartan-HCTZ and metoprolol in the past but had some problems with syncope and stopped taking her medications. Sheexperienced LEE on amlodipine. She has had HTN since 2009 as well as a history of preeclampsia. Because her BP has remained elevated for years, she is sensitive to changes in her HTN regimen. She reports that it took her 2 weeks for her body to adjust to the dose increase of losartan from 50mg  to 100mg  daily.Titrating medications slowly due to pt sensitivity. 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 At last visit, spironolactone was increased to 50mg  daily and f/u BMET was stable. Lower stress levels at work and better sleep hygiene were helping to improve BP as well.   Pt presents today in good spirits. She has been feeling well overall. Home BP readings have mainly been in the 150/80s. She went without her spironolactone for 4 days and noticed that her headaches returned. They disappeared once she resumed her spironolactone. She has been drinking some apple cider vinegar which she thinks is helping with her BP. She continues to smoke about 1 pack every 2 days. She is still interested in quitting. She uses the nicotine gum at work which is helpful, but states that she continues to smoke at home. She continues to experience a lower amount of stress with her new job  and her daughter has returned home as well. She has been sleeping well, too.  Current HTN meds:carvedilol 12.5mg  BID, losartan 100mg  daily, spironolactone 50mg  daily Previously tried:amlodipine - LEE, hydrochlorothiazide- syncope BP goal: <130/50mmHg  Family History: The patient's family history includes Breast cancer in her maternal grandmother; Heart disease in her father; Hypertension in her father and mother; Prostate cancer in her paternal grandfather.  Social History: She has worked on cutting back on her cigarette intake - she is down to smoking 5 cigarettes per day. The nicotine gum has been helping with her cravings. She stopped using the patch because it made her feel nervous and jittery. She has a daughter going off to college, has a financial incentive at work to quit, and has a supportive family. Denies alcohol and illicit drug use.  Diet:Eats 1 meal a day ~2:30pm before work. Occasionally eats yogurt or bagel for breakfast. Low sodium diet. 2-3 cups of coffee each day.   Exercise: Constantly on her feet at work.  Wt Readings from Last 3 Encounters:  09/15/17 177 lb (80.3 kg)  09/01/17 178 lb (80.7 kg)  06/25/17 180 lb 3.2 oz (81.7 kg)   BP Readings from Last 3 Encounters:  09/28/17 138/86  09/15/17 (!) 160/120  09/14/17 (!) 152/104   Pulse Readings from Last 3 Encounters:  09/28/17 78  09/15/17 75  09/14/17 81    Renal function: CrCl cannot be calculated (Unknown ideal weight.).  Past Medical History:  Diagnosis Date  . Arthritis   . Chicken pox   . Frequent headaches   . GERD (gastroesophageal reflux disease)   . Hypertension   . Stroke ([place])   . Syncope     Current Outpatient Medications on File Prior to Visit  Medication Sig Dispense Refill  . carvedilol (COREG) 12.5 MG tablet Take 1 tablet (12.5 mg total) by mouth 2 (two) times daily. 60 tablet 11  . losartan (COZAAR) 100 MG tablet Take 1 tablet (100 mg total) by mouth daily. 90 tablet 3  .  nicotine polacrilex (NICORETTE) 4 MG gum Take 1 each (4 mg total) by mouth as needed for smoking cessation. 100 each 1  . spironolactone (ALDACTONE) 50 MG tablet Take 1 tablet (50 mg total) by mouth daily. 30 tablet 11  . triamcinolone cream (KENALOG) 0.1 % Apply 1 application topically 2 (two) times daily. 30 g 0   No current facility-administered medications on file prior to visit.     Allergies  Allergen Reactions  . Sulfa Drugs Cross Reactors Anaphylaxis, Swelling and Rash     Assessment/Plan:  1. Hypertension - BP overall improved however remains above goal <130/38mmHg. Will increase carvedilol to 25mg  BID and continue losartan 100mg  daily and spironolactone 50mg  daily. F/u in clinic in 4 weeks. Will titrate spironolactone further if additional BP lowering is needed at that visit.  2. Smoking cessation - Pt continues to smoke 1/2 PPD. The nicotine gum is helping with her cravings at work, however she continues to smoke at home in the evening. When she tries to stop completely, she feels jittery. Will start nicotine 7mg  patch daily - will use lower dose than normal since pt continues to smoke.    Megan E. Supple, PharmD, CPP, [facility] [address] [place], [place] [address] Phone: [phone]; Fax: [phone] 11/19/2017 4:09 PM

## 2017-11-24 ENCOUNTER — Ambulatory Visit (INDEPENDENT_AMBULATORY_CARE_PROVIDER_SITE_OTHER): Payer: 59 | Admitting: Nurse Practitioner

## 2017-11-24 ENCOUNTER — Encounter: Payer: Self-pay | Admitting: Nurse Practitioner

## 2017-11-24 VITALS — BP 130/86 | HR 81 | Temp 98.1°F | Resp 16 | Ht 69.0 in | Wt 180.0 lb

## 2017-11-24 DIAGNOSIS — F419 Anxiety disorder, unspecified: Secondary | ICD-10-CM

## 2017-11-24 MED ORDER — SERTRALINE HCL 50 MG PO TABS: 50.0000 mg | ORAL_TABLET | Freq: Every day | ORAL | 1 refills | Status: DC

## 2017-11-24 NOTE — Progress Notes (Signed)
Subjective:    Patient ID: Debra Wade, female    DOB: [birth year], 47 y.o.   MRN: [id number]  HPI Debra Wade is a 46yo female who presents today to establish care. She Is transferring to me from another provider in the same clinic. She has a significant history of hypertension which she Is followed by cardiology for. She does not have any other chronic medical conditions. She presents today with c/o agitation.  Agitation- This problem is chronic. This problem has been occurring her whole life. This problem does not vary with time of month This is the first medical encounter for this problem. She feels like she is constantly irritated, but about twice a week feels extremely irritated and overreacts to situations, even small things cause an intense reaction of anger and irritation for her. She discussed her irritability with her cardiologist, and decided she wants to get better control of her mood as she is concerned it may be causing elevated blood pressure. 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 She has 5 children at home which is constantly stressful. Denies weakness, thoughts of hurting herself or other people, impulsivity, paranoia, changes in activity or energy She is very active in her church and has a good support system   GAD 7 : Generalized Anxiety Score 11/24/2017  Nervous, Anxious, on Edge 0  Control/stop worrying 2  Worry too much - different things 2  Trouble relaxing 2  Restless 0  Easily annoyed or irritable 2  Afraid - awful might happen 0  Total GAD 7 Score 8   Depression screen PHQ 2/9 11/24/2017  Decreased Interest 2  Down, Depressed, Hopeless 1  PHQ - 2 Score 3  Altered sleeping 2  Tired, decreased energy 2  Change in appetite 0  Feeling bad or failure about yourself  0  Trouble concentrating 0  Moving slowly or fidgety/restless 0    Suicidal thoughts 0  PHQ-9 Score 7    Review of Systems See HPI  Past Medical History:  Diagnosis Date  . Arthritis   . Chicken pox   . Frequent headaches   . GERD (gastroesophageal reflux disease)   . Hypertension   . Stroke ([place])   . Syncope      Social History   Socioeconomic History  . Marital status: Single    Spouse name: Not on file  . Number of children: 5  . Years of education: 65  . Highest education level: Not on file  Social Needs  . Financial resource strain: Not on file  . Food insecurity - worry: Not on file  . Food insecurity - inability: Not on file  . Transportation needs - medical: Not on file  . Transportation needs - non-medical: Not on file  Occupational History  . Occupation: [employer]  . Smoking status: Current Every Day Smoker    Packs/day: 0.30    Years: 24.00    Pack years: 7.20    Types: Cigarettes  . Smokeless tobacco: Never Used  Substance and Sexual Activity  . Alcohol use: Yes    Comment: occ.   . Drug use: No  . Sexual activity: Not on file  Other Topics Concern  . Not on file  Social History Narrative   Fun/Hobby: [place], anything relaxing   Denies abuse and feels safe at home.  Past Surgical History:  Procedure Laterality Date  . TUBAL LIGATION      Family History  Problem Relation Age of Onset  . Hypertension Mother   . Hypertension Father   . Heart disease Father   . Breast cancer Maternal Grandmother   . Prostate cancer Paternal Grandfather     Allergies  Allergen Reactions  . Sulfa Drugs Cross Reactors Anaphylaxis, Swelling and Rash    Current Outpatient Medications on File Prior to Visit  Medication Sig Dispense Refill  . carvedilol (COREG) 25 MG tablet Take 1 tablet (25 mg total) by mouth 2 (two) times daily. 60 tablet 11  . losartan (COZAAR) 100 MG tablet Take 1 tablet (100 mg total) by mouth daily. 90 tablet 3  . nicotine (NICODERM CQ - DOSED IN MG/24 HR) 7 mg/24hr patch Place 1 patch  (7 mg total) onto the skin daily. 28 patch 3  . nicotine polacrilex (NICORETTE) 4 MG gum Take 1 each (4 mg total) by mouth as needed for smoking cessation. 100 each 1  . spironolactone (ALDACTONE) 50 MG tablet Take 1 tablet (50 mg total) by mouth daily. 30 tablet 11  . triamcinolone cream (KENALOG) 0.1 % Apply 1 application topically 2 (two) times daily. 30 g 0   No current facility-administered medications on file prior to visit.     BP 130/86 (BP Location: Left Arm, Patient Position: Sitting, Cuff Size: Large)   Pulse 81   Temp 98.1 F (36.7 C) (Oral)   Resp 16   Ht 5\' 9"  (1.753 m)   Wt 180 lb (81.6 kg)   SpO2 98%   BMI 26.58 kg/m      Objective:   Physical Exam  Constitutional: She is oriented to person, place, and time. She appears well-developed and well-nourished. No distress.  HENT:  Head: Normocephalic and atraumatic.  Cardiovascular: Normal rate, regular rhythm, normal heart sounds and intact distal pulses.  Pulmonary/Chest: Effort normal and breath sounds normal.  Neurological: She is alert and oriented to person, place, and time. Coordination normal.  Skin: Skin is warm and dry.  Psychiatric: She has a normal mood and affect. Her speech is normal and behavior is normal. Judgment and thought content normal. She is not aggressive and not hyperactive. Cognition and memory are normal. She does not express impulsivity or inappropriate judgment.      Assessment & Plan:   Anxiety Diagnostic testing ordered: - TSH;Recent TSH on 06/09/17 was normal. Referrals ordered: - Ambulatory referral to Psychology-for counseling- she is agreeable Medications ordered: - sertraline (ZOLOFT) 50 MG tablet; Take 1 tablet (50 mg total) by mouth daily.  Dispense: 30 tablet; Refill: 1 Discussed side effects including suicidality, she will stop meidcation and go to ED if this occurs. She will start with 1/2 tablet on week 1, then incrase to full tablet on week 2. She will F/u in 1 month.

## 2017-11-24 NOTE — Patient Instructions (Addendum)
Please head downstairs for lab work.  I have sent a new prescription for zoloft 50mg  tablets to your pharmacy. Please start 1/2 tablet once daily for 1 week and then increase to a full tablet once daily on week two as tolerated.  Common side effects of this medication can be nausea, drowsiness and weight gain.  Also, rarely people have experienced suicidal thoughts when taking this medication.  You must discontinue the medication and go directly to the emergency department if this occurs..   I have placed a referral for counseling. Our office will call you to schedule this appointment. You should hear from our office in 7-10 days.  Id like to see you back in about 1 month to see how you are doing.  It was nice to meet you. Thanks for letting me take care of you today :)

## 2017-12-17 ENCOUNTER — Ambulatory Visit: Payer: 59

## 2017-12-17 NOTE — Progress Notes (Deleted)
Patient ID: Debra Wade                 DOB: [birth year]                      MRN: [id number]     HPI: Debra Wade a 47 y.o.femalereferred by Dr. Carlye Grippe HTN clinic.PMH is significant for HTN, CVA, and headaches. She was recently referred by PCP to cardiology for management of HTN. She had taken losartan-HCTZ and metoprolol in the past but had some problems with syncope and stopped taking her medications. Sheexperienced LEE on amlodipine. She has had HTN since 2009 as well as a history of preeclampsia. Because her BP has remained elevated for years, she is sensitive to changes in her HTN regimen. She reports that it took her 2 weeks for her body to adjust to the dose increase of losartan from 50mg  to 100mg  daily.Titrating medications slowly due to pt sensitivity. She was encouraged to wear compression hose to help with orthostasis which has been previously exacerbated by diuretics and standing on her feet for long periods of time.She reports that her BP in her right arm is higher than her left arm due to residual effects from her stroke ~20 years ago. At last visit, carvedilolwas increased to 25mg  BID.Lower stress levels at work and better sleep hygiene were helping to improve BP as well.  Feeling ok on higher dose of carvedilol? Inc spiro if needed Started nicotine patch?  Smoking in evening still?  1/2 PPD total?  Pt presents today in good spirits. She has been feeling well overall. Home BP readings have mainly been in the 150/80s. She went without her spironolactone for 4 days and noticed that her headaches returned. They disappeared once she resumed her spironolactone. She has been drinking some apple cider vinegar which she thinks is helping with her BP. She continues to smoke about 1 pack every 2 days. She is still interested in quitting. She uses the nicotine gum at work which is helpful, but states that she continues to smoke at home. She continues to experience a lower amount  of stress with her new job and her daughter has returned home as well. She has been sleeping well, too.  Current HTN meds:carvedilol 25mg  BID, losartan 100mg  daily, spironolactone50mg  daily Previously tried:amlodipine - LEE, hydrochlorothiazide- syncope BP goal: <130/81mmHg  Family History: The patient's family history includes Breast cancer in her maternal grandmother; Heart disease in her father; Hypertension in her father and mother; Prostate cancer in her paternal grandfather.  Social History: She has worked on cutting back on her cigarette intake - she is down to smoking 5 cigarettes per day. The nicotine gum has been helping with her cravings. She stopped using the patch because it made her feel nervous and jittery. She has a daughter going off to college, has a financial incentive at work to quit, and has a supportive family. Denies alcohol and illicit drug use.  Diet:Eats 1 meal a day ~2:30pm before work. Occasionally eats yogurt or bagel for breakfast. Low sodium diet. 2-3 cups of coffee each day.   Exercise: Constantly on her feet at work.  Wt Readings from Last 3 Encounters:  11/24/17 180 lb (81.6 kg)  09/15/17 177 lb (80.3 kg)  09/01/17 178 lb (80.7 kg)   BP Readings from Last 3 Encounters:  11/24/17 130/86  11/19/17 (!) 140/96  09/28/17 138/86   Pulse Readings from Last 3 Encounters:  11/24/17 81  11/19/17 73  09/28/17 78    Renal function: CrCl cannot be calculated (Patient's most recent lab result is older than the maximum 21 days allowed.).  Past Medical History:  Diagnosis Date  . Arthritis   . Chicken pox   . Frequent headaches   . GERD (gastroesophageal reflux disease)   . Hypertension   . Stroke ([place])   . Syncope     Current Outpatient Medications on File Prior to Visit  Medication Sig Dispense Refill  . carvedilol (COREG) 25 MG tablet Take 1 tablet (25 mg total) by mouth 2 (two) times daily. 60 tablet 11  . losartan (COZAAR) 100 MG tablet  Take 1 tablet (100 mg total) by mouth daily. 90 tablet 3  . nicotine (NICODERM CQ - DOSED IN MG/24 HR) 7 mg/24hr patch Place 1 patch (7 mg total) onto the skin daily. 28 patch 3  . nicotine polacrilex (NICORETTE) 4 MG gum Take 1 each (4 mg total) by mouth as needed for smoking cessation. 100 each 1  . sertraline (ZOLOFT) 50 MG tablet Take 1 tablet (50 mg total) by mouth daily. 30 tablet 1  . spironolactone (ALDACTONE) 50 MG tablet Take 1 tablet (50 mg total) by mouth daily. 30 tablet 11  . triamcinolone cream (KENALOG) 0.1 % Apply 1 application topically 2 (two) times daily. 30 g 0   No current facility-administered medications on file prior to visit.     Allergies  Allergen Reactions  . Sulfa Drugs Cross Reactors Anaphylaxis, Swelling and Rash     Assessment/Plan:  1. Hypertension -

## 2017-12-29 ENCOUNTER — Ambulatory Visit: Payer: 59 | Admitting: Licensed Clinical Social Worker

## 2018-01-11 ENCOUNTER — Ambulatory Visit: Payer: 59 | Admitting: Nurse Practitioner

## 2018-01-11 DIAGNOSIS — Z0289 Encounter for other administrative examinations: Secondary | ICD-10-CM

## 2018-01-11 NOTE — Progress Notes (Deleted)
Name: Debra Wade   MRN: [id number]    DOB: 17-Nov-1971   Date:01/11/2018       Progress Note  Subjective  Chief Complaint  No chief complaint on file.   HPI  Anxiety-  She was started on zoloft 50mg  on 12/18 visit for anxiety. seh was unable to follow up until today. A referral to counseling was also made  PHQ AND GAD   Patient Active Problem List   Diagnosis Date Noted  . Tobacco abuse 07/06/2017  . Essential hypertension 06/09/2017  . Fatigue 06/09/2017  . Snoring 06/09/2017    Past Surgical History:  Procedure Laterality Date  . TUBAL LIGATION      Family History  Problem Relation Age of Onset  . Hypertension Mother   . Hypertension Father   . Heart disease Father   . Breast cancer Maternal Grandmother   . Prostate cancer Paternal Grandfather     Social History   Socioeconomic History  . Marital status: Single    Spouse name: Not on file  . Number of children: 5  . Years of education: 29  . Highest education level: Not on file  Social Needs  . Financial resource strain: Not on file  . Food insecurity - worry: Not on file  . Food insecurity - inability: Not on file  . Transportation needs - medical: Not on file  . Transportation needs - non-medical: Not on file  Occupational History  . Occupation: [employer]  . Smoking status: Current Every Day Smoker    Packs/day: 0.30    Years: 24.00    Pack years: 7.20    Types: Cigarettes  . Smokeless tobacco: Never Used  Substance and Sexual Activity  . Alcohol use: Yes    Comment: occ.   . Drug use: No  . Sexual activity: Not on file  Other Topics Concern  . Not on file  Social History Narrative   Fun/Hobby: [place], anything relaxing   Denies abuse and feels safe at home.     Current Outpatient Medications:  .  carvedilol (COREG) 25 MG tablet, Take 1 tablet (25 mg total) by mouth 2 (two) times daily., Disp: 60 tablet, Rfl: 11 .  losartan (COZAAR) 100 MG tablet, Take 1 tablet (100 mg  total) by mouth daily., Disp: 90 tablet, Rfl: 3 .  nicotine (NICODERM CQ - DOSED IN MG/24 HR) 7 mg/24hr patch, Place 1 patch (7 mg total) onto the skin daily., Disp: 28 patch, Rfl: 3 .  nicotine polacrilex (NICORETTE) 4 MG gum, Take 1 each (4 mg total) by mouth as needed for smoking cessation., Disp: 100 each, Rfl: 1 .  sertraline (ZOLOFT) 50 MG tablet, Take 1 tablet (50 mg total) by mouth daily., Disp: 30 tablet, Rfl: 1 .  spironolactone (ALDACTONE) 50 MG tablet, Take 1 tablet (50 mg total) by mouth daily., Disp: 30 tablet, Rfl: 11 .  triamcinolone cream (KENALOG) 0.1 %, Apply 1 application topically 2 (two) times daily., Disp: 30 g, Rfl: 0  Allergies  Allergen Reactions  . Sulfa Drugs Cross Reactors Anaphylaxis, Swelling and Rash     ROS  ***  Objective  There were no vitals filed for this visit. ***  There is no height or weight on file to calculate BMI.  Physical Exam ***  Recent Results (from the past 2160 hour(s))  Basic Metabolic Panel (BMET)     Status: None   Collection Time: 11/02/17  3:39 PM  Result Value Ref Range  Glucose 91 65 - 99 mg/dL   BUN 9 6 - 24 mg/dL   Creatinine, Ser 0.88 0.57 - 1.00 mg/dL   GFR calc non Af Amer 80 >59 mL/min/1.73   GFR calc Af Amer 92 >59 mL/min/1.73   BUN/Creatinine Ratio 10 9 - 23   Sodium 143 134 - 144 mmol/L   Potassium 3.7 3.5 - 5.2 mmol/L   Chloride 106 96 - 106 mmol/L   CO2 22 20 - 29 mmol/L   Calcium 9.2 8.7 - 10.2 mg/dL    Diabetic Foot Exam: Diabetic Foot Exam - Simple   No data filed     ***  PHQ2/9: Depression screen PHQ 2/9 11/24/2017  Decreased Interest 2  Down, Depressed, Hopeless 1  PHQ - 2 Score 3  Altered sleeping 2  Tired, decreased energy 2  Change in appetite 0  Feeling bad or failure about yourself  0  Trouble concentrating 0  Moving slowly or fidgety/restless 0  Suicidal thoughts 0  PHQ-9 Score 7   ***  Fall Risk: No flowsheet data found. ***   Functional Status Survey:    ***  Assessment & Plan  There are no diagnoses linked to this encounter.  -Reviewed Health Maintenance: ***  needsCPE next

## 2018-02-16 ENCOUNTER — Ambulatory Visit (INDEPENDENT_AMBULATORY_CARE_PROVIDER_SITE_OTHER): Payer: 59 | Admitting: Pharmacist

## 2018-02-16 VITALS — BP 138/92 | HR 75

## 2018-02-16 DIAGNOSIS — I10 Essential (primary) hypertension: Secondary | ICD-10-CM

## 2018-02-16 MED ORDER — SPIRONOLACTONE 100 MG PO TABS: 100.0000 mg | ORAL_TABLET | Freq: Every day | ORAL | 11 refills | Status: DC

## 2018-02-16 NOTE — Progress Notes (Signed)
Patient ID: VANESSA KAMPF                 DOB: [birth year]                      MRN: [id number]     HPI: Kyliah Deanda Herbinis a 47 y.o.femalereferred by Dr. [facility].PMH is significant for HTN, CVA, and headaches. She was recently referred by PCP to cardiology for management of HTN. She had taken losartan-HCTZ and metoprolol in the past but had some problems with syncope and stopped taking her medications. Sheexperienced LEE on amlodipine. She has had HTN since 2009 as well as a history of preeclampsia. Because her BP has remained elevated for years, she is sensitive to changes in her HTN regimen. She reports that it took her 2 weeks for her body to adjust to the dose increase of losartan from 50mg  to 100mg  daily.Titrating medications slowly due to pt sensitivity. 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 At last visit, carvedilol was increased to 25mg  BID. Lower stress levels at work and better sleep hygiene were helping to improve BP as well.  Pt presents today in good spirits. She has been feeling well overall. She has not been checking her BP recently. She has been drinking some apple cider vinegar which she thinks is helping with her BP. She is currently smoking 2 cigarettes at work and 5-7 cigarettes at home. She did not like the nicotine patch because it made her anxious. She does use the nicotine gum at work and states that it is helpful, although she has not tried using it at home.  She continues to experience a lower amount of stress with her new job and her daughter has returned home as well. She has been sleeping well, too. She has started a new diet and has  been focusing on eating more fruits, vegetables, lean meat, and yogurt,  Current HTN meds:carvedilol 25mg  BID, losartan 100mg  daily (PM), spironolactone 50mg  daily (AM) Previously tried:amlodipine - LEE, hydrochlorothiazide- syncope BP goal: <130/55mmHg  Family History: The patient's family history includes Breast cancer in her maternal grandmother; Heart disease in her father; Hypertension in her father and mother; Prostate cancer in her paternal grandfather.  Social History: She has worked on cutting back on her cigarette intake - she is down to smoking 5 cigarettes per day. The nicotine gum has been helping with her cravings. She stopped using the patch because it made her feel nervous and jittery. She has a daughter going off to college, has a financial incentive at work to quit, and has a supportive family. Denies alcohol and illicit drug use.  Diet:Eats 1 meal a day ~2:30pm before work. She has been eating more fruits, vegetables, yogurt, and lean meat. Low sodium diet. 2-3 cups of coffee each day.   Exercise: Constantly on her feet at work.  Wt Readings from Last 3 Encounters:  11/24/17 180 lb (81.6 kg)  09/15/17 177 lb (80.3 kg)  09/01/17 178 lb (80.7 kg)   BP Readings from Last 3 Encounters:  11/24/17 130/86  11/19/17 (!) 140/96  09/28/17 138/86   Pulse Readings  from Last 3 Encounters:  11/24/17 81  11/19/17 73  09/28/17 78    Renal function: CrCl cannot be calculated (Patient's most recent lab result is older than the maximum 21 days allowed.).  Past Medical History:  Diagnosis Date  . Arthritis   . Chicken pox   . Frequent headaches   . GERD (gastroesophageal reflux disease)   . Hypertension   . Stroke ([place])   . Syncope     Current Outpatient Medications on File Prior to Visit  Medication Sig Dispense Refill  . carvedilol (COREG) 25 MG tablet Take 1 tablet (25 mg total) by mouth 2 (two) times daily. 60 tablet 11  . losartan (COZAAR) 100 MG tablet  Take 1 tablet (100 mg total) by mouth daily. 90 tablet 3  . nicotine (NICODERM CQ - DOSED IN MG/24 HR) 7 mg/24hr patch Place 1 patch (7 mg total) onto the skin daily. 28 patch 3  . nicotine polacrilex (NICORETTE) 4 MG gum Take 1 each (4 mg total) by mouth as needed for smoking cessation. 100 each 1  . sertraline (ZOLOFT) 50 MG tablet Take 1 tablet (50 mg total) by mouth daily. 30 tablet 1  . spironolactone (ALDACTONE) 50 MG tablet Take 1 tablet (50 mg total) by mouth daily. 30 tablet 11  . triamcinolone cream (KENALOG) 0.1 % Apply 1 application topically 2 (two) times daily. 30 g 0   No current facility-administered medications on file prior to visit.     Allergies  Allergen Reactions  . Sulfa Drugs Cross Reactors Anaphylaxis, Swelling and Rash     Assessment/Plan:  1. Hypertension - BP remains above goal <130/17mmHg. Will check BMET today and plan to increase spironolactone to 100mg  daily and continue carvedilol 25mg  BID and losartan 100mg  daily. F/u in clinic in 3 weeks.   2. Smoking cessation - Pt continues to smoke 1/2 PPD. The nicotine gum is helping with her cravings at work, however she continues to smoke at home in the evening. Encouraged pt to start using nicotine gum at home to help with evening cravings. Will d/c nicotine patch since pt reports this made her nervous and anxious.   Zurie Platas E. Ailyn Gladd, PharmD, CPP, [place] [address], [place], [place] [address] Phone: [phone]; Fax: [phone] 02/16/2018 9:23 AM

## 2018-02-16 NOTE — Patient Instructions (Addendum)
It was nice to see you today  Increase your spironolactone to 100mg  daily  Continue taking your other medications  Follow up in clinic in 3 weeks

## 2018-02-17 LAB — BASIC METABOLIC PANEL
BUN/Creatinine Ratio: 12 (ref 9–23)
BUN: 11 mg/dL (ref 6–24)
CO2: 22 mmol/L (ref 20–29)
Calcium: 9.4 mg/dL (ref 8.7–10.2)
Chloride: 105 mmol/L (ref 96–106)
Creatinine, Ser: 0.92 mg/dL (ref 0.57–1.00)
GFR calc Af Amer: 88 mL/min/{1.73_m2} (ref >59)
GFR, EST NON AFRICAN AMERICAN: 76 mL/min/{1.73_m2} (ref >59)
Glucose: 86 mg/dL (ref 65–99)
POTASSIUM: 3.9 mmol/L (ref 3.5–5.2)
SODIUM: 145 mmol/L — AB (ref 134–144)

## 2018-02-18 ENCOUNTER — Ambulatory Visit (INDEPENDENT_AMBULATORY_CARE_PROVIDER_SITE_OTHER): Payer: 59 | Admitting: Nurse Practitioner

## 2018-02-18 ENCOUNTER — Encounter: Payer: Self-pay | Admitting: Nurse Practitioner

## 2018-02-18 VITALS — BP 124/80 | HR 68 | Temp 97.8°F | Resp 16 | Ht 69.0 in | Wt 185.0 lb

## 2018-02-18 DIAGNOSIS — F419 Anxiety disorder, unspecified: Secondary | ICD-10-CM | POA: Diagnosis not present

## 2018-02-18 NOTE — Patient Instructions (Signed)
I am glad you are doing better!  Return in around 3 months for your annual physical.  It was good to see you today. Thanks for letting me take care of you today :)   Mindfulness-Based Stress Reduction Mindfulness-based stress reduction (MBSR) is a program that helps people learn to practice mindfulness. Mindfulness is the practice of intentionally paying attention to the present moment. It can be learned and practiced through techniques such as education, breathing exercises, meditation, and yoga. MBSR includes several mindfulness techniques in one program. MBSR works best when you understand the treatment, are willing to try new things, and can commit to spending time practicing what you learn. MBSR training may include learning about:  How your emotions, thoughts, and reactions affect your body.  New ways to respond to things that cause negative thoughts to start (triggers).  How to notice your thoughts and let go of them.  Practicing awareness of everyday things that you normally do without thinking.  The techniques and goals of different types of meditation.  What are the benefits of MBSR? MBSR can have many benefits, which include helping you to:  Develop self-awareness. This refers to knowing and understanding yourself.  Learn skills and attitudes that help you to participate in your own health care.  Learn new ways to care for yourself.  Be more accepting about how things are, and let things go.  Be less judgmental and approach things with an open mind.  Be patient with yourself and trust yourself more.  MBSR has also been shown to:  Reduce negative emotions, such as depression and anxiety.  Improve memory and focus.  Change how you sense and approach pain.  Boost your body's ability to fight infections.  Help you connect better with other people.  Improve your sense of well-being.  Follow these instructions at home:  Find a local in-person or online MBSR  program.  Set aside some time regularly for mindfulness practice.  Find a mindfulness practice that works best for you. This may include one or more of the following: ? Meditation. Meditation involves focusing your mind on a certain thought or activity. ? Breathing awareness exercises. These help you to stay present by focusing on your breath. ? Body scan. For this practice, you lie down and pay attention to each part of your body from head to toe. You can identify tension and soreness and intentionally relax parts of your body. ? Yoga. Yoga involves stretching and breathing, and it can improve your ability to move and be flexible. It can also provide an experience of testing your body's limits, which can help you release stress. ? Mindful eating. This way of eating involves focusing on the taste, texture, color, and smell of each bite of food. Because this slows down eating and helps you feel full sooner, it can be an important part of a weight-loss plan.  Find a podcast or recording that provides guidance for breathing awareness, body scan, or meditation exercises. You can listen to these any time when you have a free moment to rest without distractions.  Follow your treatment plan as told by your health care provider. This may include taking regular medicines and making changes to your diet or lifestyle as recommended. How to practice mindfulness To do a basic awareness exercise:  Find a comfortable place to sit.  Pay attention to the present moment. Observe your thoughts, feelings, and surroundings just as they are.  Avoid placing judgment on yourself, your feelings, or your  surroundings. Make note of any judgment that comes up, and let it go.  Your mind may wander, and that is okay. Make note of when your thoughts drift, and return your attention to the present moment.  To do basic mindfulness meditation:  Find a comfortable place to sit. This may include a stable chair or a firm  floor cushion. ? Sit upright with your back straight. Let your arms fall next to your side with your hands resting on your legs. ? If sitting in a chair, rest your feet flat on the floor. ? If sitting on a cushion, cross your legs in front of you.  Keep your head in a neutral position with your chin dropped slightly. Relax your jaw and rest the tip of your tongue on the roof of your mouth. Drop your gaze to the floor. You can close your eyes if you like.  Breathe normally and pay attention to your breath. Feel the air moving in and out of your nose. Feel your belly expanding and relaxing with each breath.  Your mind may wander, and that is okay. Make note of when your thoughts drift, and return your attention to your breath.  Avoid placing judgment on yourself, your feelings, or your surroundings. Make note of any judgment or feelings that come up, let them go, and bring your attention back to your breath.  When you are ready, lift your gaze or open your eyes. Pay attention to how your body feels after the meditation.  Where to find more information: You can find more information about MBSR from:  Your health care provider.  Community-based meditation centers or programs.  Programs offered near you.  Summary  Mindfulness-based stress reduction (MBSR) is a program that teaches you how to intentionally pay attention to the present moment. It is used with other treatments to help you cope better with daily stress, emotions, and pain.  MBSR focuses on developing self-awareness, which allows you to respond to life stress without judgment or negative emotions.  MBSR programs may involve learning different mindfulness practices, such as breathing exercises, meditation, yoga, body scan, or mindful eating. Find a mindfulness practice that works best for you, and set aside time for it on a regular basis. This information is not intended to replace advice given to you by your health care  provider. Make sure you discuss any questions you have with your health care provider. Document Released: 04/02/2017 Document Revised: 04/02/2017 Document Reviewed: 04/02/2017 Elsevier Interactive Patient Education  [employer].

## 2018-02-18 NOTE — Progress Notes (Signed)
Name: Debra Wade   MRN: [id number]    DOB: 07/13/1971   Date:02/18/2018       Progress Note  Subjective  Chief Complaint  Chief Complaint  Patient presents with  . Follow-up    depression    HPI  Anxiety- At her last OV on 12/18 we started her on zoloft and referred to counseling for a long history of anxiety, irritability. She was wanting to start therapy for her mood because she felt like it may have been contributing to her elevated blood pressures. She tells me today she did not pick up zoloft and cancelled the referral to counseling. She says that she had 4 position changes at her job last year and thinks she was just overwhelmed with job stress when we first met at her last visit. She is very religious and feels like God and her religion have helped her through her anxiety. Since our last visit, her job has stabilized and she has been able to enjoy spending more time with her family and relaxing after work in the evenings. She says her BP has actually been much better as well and last followed up with cardiology yesterday.   Depression screen PHQ 2/9 02/18/2018 11/24/2017  Decreased Interest 1 2  Down, Depressed, Hopeless 0 1  PHQ - 2 Score 1 3  Altered sleeping 0 2  Tired, decreased energy 2 2  Change in appetite 1 0  Feeling bad or failure about yourself  0 0  Trouble concentrating 0 0  Moving slowly or fidgety/restless 1 0  Suicidal thoughts 0 0  PHQ-9 Score 5 7   GAD 7 : Generalized Anxiety Score 02/18/2018 11/24/2017  Nervous, Anxious, on Edge 0 0  Control/stop worrying 1 2  Worry too much - different things 1 2  Trouble relaxing 0 2  Restless 0 0  Easily annoyed or irritable 1 2  Afraid - awful might happen 0 0  Total GAD 7 Score 3 8      Patient Active Problem List   Diagnosis Date Noted  . Tobacco abuse 07/06/2017  . Essential hypertension 06/09/2017  . Fatigue 06/09/2017  . Snoring 06/09/2017    Past Surgical History:  Procedure Laterality Date   . TUBAL LIGATION      Family History  Problem Relation Age of Onset  . Hypertension Mother   . Hypertension Father   . Heart disease Father   . Breast cancer Maternal Grandmother   . Prostate cancer Paternal Grandfather     Social History   Socioeconomic History  . Marital status: Single    Spouse name: Not on file  . Number of children: 5  . Years of education: 11  . Highest education level: Not on file  Social Needs  . Financial resource strain: Not on file  . Food insecurity - worry: Not on file  . Food insecurity - inability: Not on file  . Transportation needs - medical: Not on file  . Transportation needs - non-medical: Not on file  Occupational History  . Occupation: Med Tech  Tobacco Use  . Smoking status: Current Every Day Smoker    Packs/day: 0.30    Years: 24.00    Pack years: 7.20    Types: Cigarettes  . Smokeless tobacco: Never Used  Substance and Sexual Activity  . Alcohol use: Yes    Comment: occ.   . Drug use: No  . Sexual activity: Not on file  Other Topics Concern  . Not   on file  Social History Narrative   Fun/Hobby: Beach, anything relaxing   Denies abuse and feels safe at home.     Current Outpatient Medications:  .  carvedilol (COREG) 25 MG tablet, Take 1 tablet (25 mg total) by mouth 2 (two) times daily., Disp: 60 tablet, Rfl: 11 .  losartan (COZAAR) 100 MG tablet, Take 1 tablet (100 mg total) by mouth daily., Disp: 90 tablet, Rfl: 3 .  nicotine polacrilex (NICORETTE) 4 MG gum, Take 1 each (4 mg total) by mouth as needed for smoking cessation., Disp: 100 each, Rfl: 1 .  spironolactone (ALDACTONE) 100 MG tablet, Take 1 tablet (100 mg total) by mouth daily., Disp: 30 tablet, Rfl: 11 .  triamcinolone cream (KENALOG) 0.1 %, Apply 1 application topically 2 (two) times daily., Disp: 30 g, Rfl: 0  Allergies  Allergen Reactions  . Sulfa Drugs Cross Reactors Anaphylaxis, Swelling and Rash     ROS See HPI  Objective  Vitals:   02/18/18  0901  BP: 124/80  Pulse: 68  Resp: 16  Temp: 97.8 F (36.6 C)  TempSrc: Oral  SpO2: 96%  Weight: 185 lb (83.9 kg)  Height: 5' 9" (1.753 m)    Body mass index is 27.32 kg/m.  Physical Exam Vital signs reviewed. Constitutional: Patient appears well-developed and well-nourished. No distress.  HENT: Head: Normocephalic and atraumatic. Nose: Nose normal. Mouth/Throat: Oropharynx is clear and moist. Eyes: Conjunctivae and EOM are normal.No scleral icterus.  Neck: Normal range of motion. Neck supple.  Cardiovascular: Normal rate, regular rhythm.  Distal pulses intact. Pulmonary/Chest: Effort normal and breath sounds normal. No respiratory distress. Neurological: She is alert and oriented to person, place, and time.Coordination, balance, strength, speech and gait are normal.  Skin: Skin is warm and dry. No rash noted. No erythema.  Psychiatric: Patient has a normal mood and affect. behavior is normal. Judgment and thought content normal.    Assessment & Plan RTC in about 3 months for CPE   Anxiety This is much improved and she reports feeling great at appointment today No further treatment needed at this time but she will F/U for return, worsening symptoms in the future Discussed nonpharm techniques to reduce stress -See AVS for additional information provided to patient   

## 2018-03-09 ENCOUNTER — Ambulatory Visit: Payer: 59

## 2018-03-09 NOTE — Progress Notes (Deleted)
Patient ID: Debra Wade                 DOB: 1971-02-08                      MRN: [id number]     HPI: Debra Wade a 47 y.o.femalereferred by Dr. Carlye [facility]rippe HTN clinic.PMH is significant for HTN, CVA, and headaches. She was recently referred by PCP to cardiology for management of HTN. She had taken losartan-HCTZ and metoprolol in the past but had some problems with syncope and stopped taking her medications. Sheexperienced LEE on amlodipine. She has had HTN since 2009 as well as a history of preeclampsia. Because her BP has remained elevated for years, she is sensitive to changes in her HTN regimen. She reports that it took her 2 weeks for her body to adjust to the dose increase of losartan from 50mg  to 100mg  daily.Titrating medications slowly due to pt sensitivity. She was encouraged to wear compression hose to help with orthostasis which has been previously exacerbated by diuretics and standing on her feet for long periods of time.She reports that her BP in her right arm is higher than her left arm due to residual effects from her stroke ~20 years ago. At last visit, spironolactone was increased to 100mg  daily. Lower stress levels at work and better sleep hygiene were helping to improve BP as well.   Using nicotine gum at home to help with cravings? 124/80 on 3/14 visit with internal med Start low dose chlorthalidone if needed   Pt presents today in good spirits. She has been feeling well overall. She has not been checking her BP recently. She has been drinking some apple cider vinegar which she thinks is helping with her BP. She is currently smoking 2 cigarettes at work and 5-7 cigarettes at home. She did not like the nicotine patch because it made her anxious. She does use the nicotine gum at work and states that it is helpful, although she has not tried using it at home.  She continues to experience a lower amount of stress with her new job and her daughter has returned home as well.  She has been sleeping well, too. She has started a new diet and has been focusing on eating more fruits, vegetables, lean meat, and yogurt,  Current HTN meds:carvedilol 25mg  BID, losartan 100mg  daily (PM), spironolactone 100mg  daily (AM) Previously tried:amlodipine - LEE, hydrochlorothiazide- syncope BP goal: <130/73mmHg  Family History: The patient's family history includes Breast cancer in her maternal grandmother; Heart disease in her father; Hypertension in her father and mother; Prostate cancer in her paternal grandfather.  Social History: She has worked on cutting back on her cigarette intake - she is down to smoking 5 cigarettes per day. The nicotine gum has been helping with her cravings. She stopped using the patch because it made her feel nervous and jittery. She has a daughter going off to college, has a financial incentive at work to quit, and has a supportive family. Denies alcohol and illicit drug use.  Diet:Eats 1 meal a day ~2:30pm before work. She has been eating more fruits, vegetables, yogurt, and lean meat. Low sodium diet. 2-3 cups of coffee each day.   Exercise: Constantly on her feet at work.  Wt Readings from Last 3 Encounters:  02/18/18 185 lb (83.9 kg)  11/24/17 180 lb (81.6 kg)  09/15/17 177 lb (80.3 kg)   BP Readings from Last 3 Encounters:  02/18/18 124/80  02/16/18 (!) 138/92  11/24/17 130/86   Pulse Readings from Last 3 Encounters:  02/18/18 68  02/16/18 75  11/24/17 81    Renal function: CrCl cannot be calculated (Unknown ideal weight.).  Past Medical History:  Diagnosis Date  . Arthritis   . Chicken pox   . Frequent headaches   . GERD (gastroesophageal reflux disease)   . Hypertension   . Stroke ([place])   . Syncope     Current Outpatient Medications on File Prior to Visit  Medication Sig Dispense Refill  . carvedilol (COREG) 25 MG tablet Take 1 tablet (25 mg total) by mouth 2 (two) times daily. 60 tablet 11  . losartan (COZAAR)  100 MG tablet Take 1 tablet (100 mg total) by mouth daily. 90 tablet 3  . nicotine polacrilex (NICORETTE) 4 MG gum Take 1 each (4 mg total) by mouth as needed for smoking cessation. 100 each 1  . spironolactone (ALDACTONE) 100 MG tablet Take 1 tablet (100 mg total) by mouth daily. 30 tablet 11  . triamcinolone cream (KENALOG) 0.1 % Apply 1 application topically 2 (two) times daily. 30 g 0   No current facility-administered medications on file prior to visit.     Allergies  Allergen Reactions  . Sulfa Drugs Cross Reactors Anaphylaxis, Swelling and Rash     Assessment/Plan:  1. Hypertension - BP remains above goal <130/38mmHg. Will check BMET today and plan to increase spironolactone to 100mg  daily and continue carvedilol 25mg  BID and losartan 100mg  daily. F/u in clinic in 3 weeks.   2. Smoking cessation - Pt continues to smoke 1/2 PPD. The nicotine gum is helping with her cravings at work, however she continues to smoke at home in the evening. Encouraged pt to start using nicotine gum at home to help with evening cravings. Will d/c nicotine patch since pt reports this made her nervous and anxious.   Megan E. Supple, PharmD, CPP, [facility] [address] [place], [place] [address] Phone: [phone]; Fax: [phone] 03/09/2018 7:31 AM

## 2018-04-06 ENCOUNTER — Ambulatory Visit (INDEPENDENT_AMBULATORY_CARE_PROVIDER_SITE_OTHER): Payer: 59 | Admitting: Pharmacist

## 2018-04-06 VITALS — BP 122/82 | HR 83

## 2018-04-06 DIAGNOSIS — I10 Essential (primary) hypertension: Secondary | ICD-10-CM

## 2018-04-06 DIAGNOSIS — Z720 Tobacco use: Secondary | ICD-10-CM

## 2018-04-06 LAB — BASIC METABOLIC PANEL
BUN/Creatinine Ratio: 13 (ref 9–23)
BUN: 14 mg/dL (ref 6–24)
CO2: 22 mmol/L (ref 20–29)
Calcium: 9.5 mg/dL (ref 8.7–10.2)
Chloride: 101 mmol/L (ref 96–106)
Creatinine, Ser: 1.07 mg/dL — ABNORMAL HIGH (ref 0.57–1.00)
GFR calc non Af Amer: 63 mL/min/{1.73_m2} (ref >59)
GFR, EST AFRICAN AMERICAN: 73 mL/min/{1.73_m2} (ref >59)
Glucose: 84 mg/dL (ref 65–99)
Potassium: 4.3 mmol/L (ref 3.5–5.2)
SODIUM: 137 mmol/L (ref 134–144)

## 2018-04-06 MED ORDER — CARVEDILOL 25 MG PO TABS: 25.0000 mg | ORAL_TABLET | Freq: Two times a day (BID) | ORAL | 3 refills | Status: DC

## 2018-04-06 MED ORDER — SPIRONOLACTONE 100 MG PO TABS: 100.0000 mg | ORAL_TABLET | Freq: Every day | ORAL | 3 refills | Status: DC

## 2018-04-06 NOTE — Progress Notes (Signed)
Patient ID: Debra Wade                 DOB: Mar 18, 1971                      MRN: [id number]     HPI: Wiletta Bermingham Herbinis a 47 y.o.femalereferred by Dr. Carlye Grippe HTN clinic.PMH is significant for HTN, CVA, and headaches. She was recently referred by PCP to cardiology for management of HTN. She had taken losartan-HCTZ and metoprolol in the past but had some problems with syncope and stopped taking her medications. Sheexperienced LEE on amlodipine. She has had HTN since 2009 as well as a history of preeclampsia. Because her BP has remained elevated for years, she is sensitive to changes in her HTN regimen. She reports that it took her 2 weeks for her body to adjust to the dose increase of losartan from 50mg  to 100mg  daily.Titrating medications slowly due to pt sensitivity. 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 At last visit, carvedilol was increased to 25mg  BID. Lower stress levels at work and better sleep hygiene were helping to improve BP as well. At last visit, spironolactone was increased to 100mg  daily.  Pt presents today in good spirits. She has been feeling well overall. She is tolerating her higher dose of spironolactone and denies dizziness, blurred vision, falls, or headaches. Home BP readings range 130-140/80. BP checked at PCP last month was 124/80. She has been drinking some apple cider vinegar which she thinks is helping with her BP. She is trying to use her nicotine gum while at home but is still smoking a bit in the evening. She continues to experience a lower amount of stress with her new job. She has been sleeping better. She has started a new diet and has been  focusing on eating more fruits, vegetables, lean meat, and yogurt.  Current HTN meds:carvedilol 25mg  BID, losartan 100mg  daily (PM), spironolactone 100mg  daily (AM) Previously tried:amlodipine - LEE, hydrochlorothiazide- syncope BP goal: <130/45mmHg  Family History: The patient's family history includes Breast cancer in her maternal grandmother; Heart disease in her father; Hypertension in her father and mother; Prostate cancer in her paternal grandfather.  Social History: She has worked on cutting back on her cigarette intake - she is down to smoking 5 cigarettes per day. The nicotine gum has been helping with her cravings. She stopped using the patch because it made her feel nervous and jittery. She has a daughter going off to college, has a financial incentive at work to quit, and has a supportive family. Denies alcohol and illicit drug use.  Diet:Eats 1 meal a day ~2:30pm before work. She has been eating more fruits, vegetables, yogurt, and lean meat. Low sodium diet. 2-3 cups of coffee each day.   Exercise: Constantly on her feet at work.  Wt Readings from Last 3 Encounters:  02/18/18 185 lb (83.9 kg)  11/24/17 180 lb (81.6 kg)  09/15/17 177 lb (80.3 kg)   BP Readings from Last 3 Encounters:  02/18/18 124/80  02/16/18 (!) 138/92  11/24/17 130/86   Pulse Readings from Last 3 Encounters:  02/18/18 68  02/16/18 75  11/24/17 81    Renal function: CrCl cannot be calculated (Patient's most recent lab result is older than the maximum 21 days allowed.).  Past Medical History:  Diagnosis Date  . Arthritis   . Chicken pox   . Frequent headaches   . GERD (gastroesophageal reflux disease)   . Hypertension   . Stroke ([place])   . Syncope     Current Outpatient Medications on File Prior to Visit  Medication Sig Dispense Refill  . carvedilol (COREG) 25 MG tablet Take 1 tablet (25 mg total) by mouth 2 (two) times daily. 60 tablet 11  . losartan (COZAAR) 100 MG tablet Take 1  tablet (100 mg total) by mouth daily. 90 tablet 3  . nicotine polacrilex (NICORETTE) 4 MG gum Take 1 each (4 mg total) by mouth as needed for smoking cessation. 100 each 1  . spironolactone (ALDACTONE) 100 MG tablet Take 1 tablet (100 mg total) by mouth daily. 30 tablet 11  . triamcinolone cream (KENALOG) 0.1 % Apply 1 application topically 2 (two) times daily. 30 g 0   No current facility-administered medications on file prior to visit.     Allergies  Allergen Reactions  . Sulfa Drugs Cross Reactors Anaphylaxis, Swelling and Rash     Assessment/Plan:  1. Hypertension - BP excellent, much improved and now at goal <130/67mmHg. Will check BMET today with recent spironolactone does increase. Will continue spironolactone 100mg  daily, carvedilol 25mg  BID and losartan 100mg  daily. F/u in HTN clinic as needed.  2. Smoking cessation - Encouraged pt to continue to use nicotine gum to help with evening cravings. Pt is motivated to cut back on tobacco use.  Jazlynne Milliner E. Aqil Goetting, PharmD, CPP, [place] [address] [place], [place] [address] Phone: [phone]; Fax: [phone] 04/06/2018 7:14 AM

## 2018-04-06 NOTE — Patient Instructions (Addendum)
It was nice to see you today  Your blood pressure looks excellent!!   Continue your current medications  Your blood pressure goal is < 130/40mmHg  Follow up in clinic as needed

## 2018-07-26 ENCOUNTER — Other Ambulatory Visit: Payer: Self-pay | Admitting: Cardiology

## 2018-07-26 MED ORDER — LOSARTAN POTASSIUM 100 MG PO TABS: 100.0000 mg | ORAL_TABLET | Freq: Every day | ORAL | 0 refills | Status: DC

## 2018-07-26 MED ORDER — SPIRONOLACTONE 100 MG PO TABS: 100.0000 mg | ORAL_TABLET | Freq: Every day | ORAL | 0 refills | Status: DC

## 2018-07-26 MED ORDER — CARVEDILOL 25 MG PO TABS: 25.0000 mg | ORAL_TABLET | Freq: Two times a day (BID) | ORAL | 0 refills | Status: DC

## 2018-08-09 IMAGING — DX DG KNEE COMPLETE 4+V*R*
5 series · 5 of 5 positions shown · non-contrast
Comparison: RIGHT knee radiograph April 16, 2014

CLINICAL DATA: Generalized RIGHT knee pain with motion for 5 days,
pain began while bowling.

EXAM:
RIGHT KNEE - COMPLETE 4+ VIEW

[knee ap]
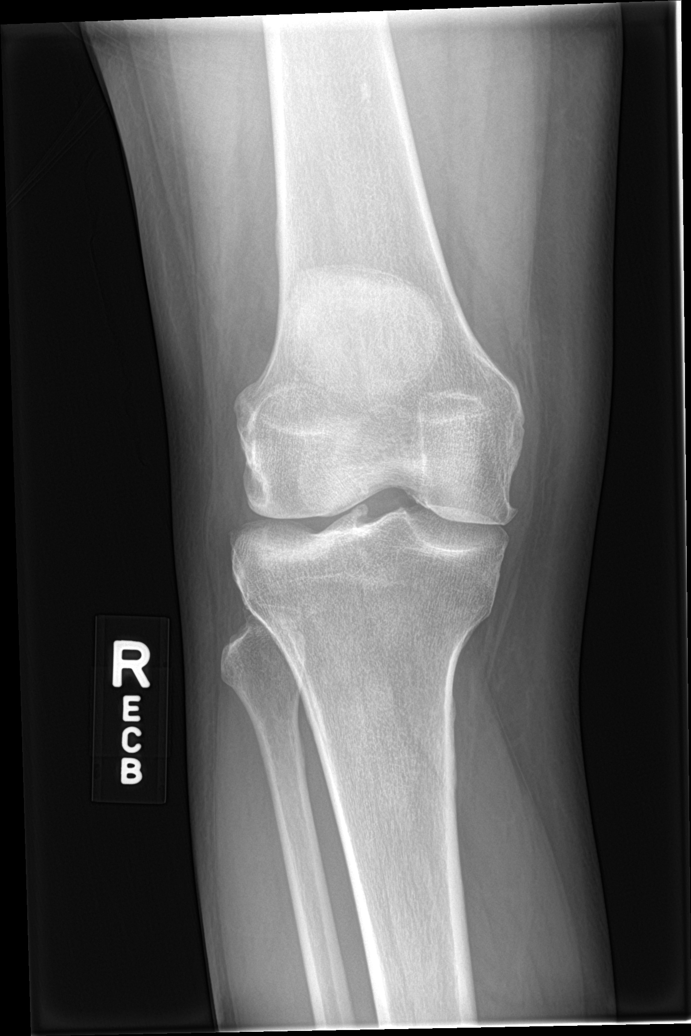

[knee lat]
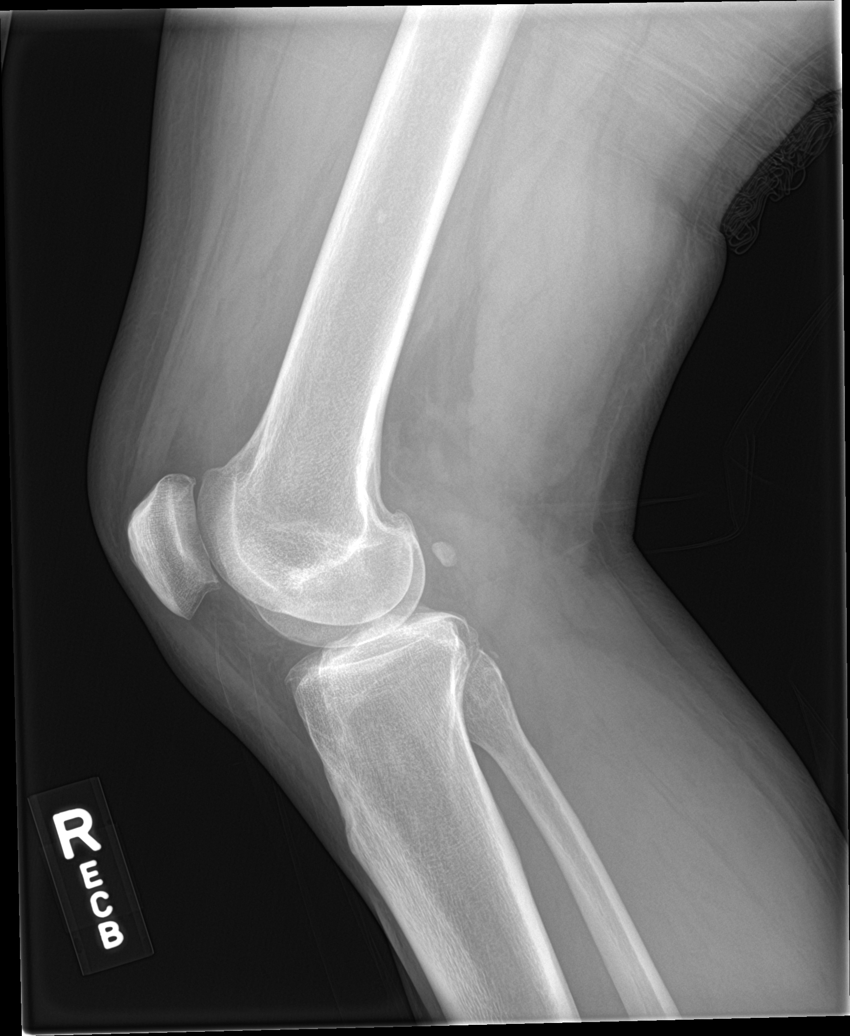

[knee obl (1 of 3)]
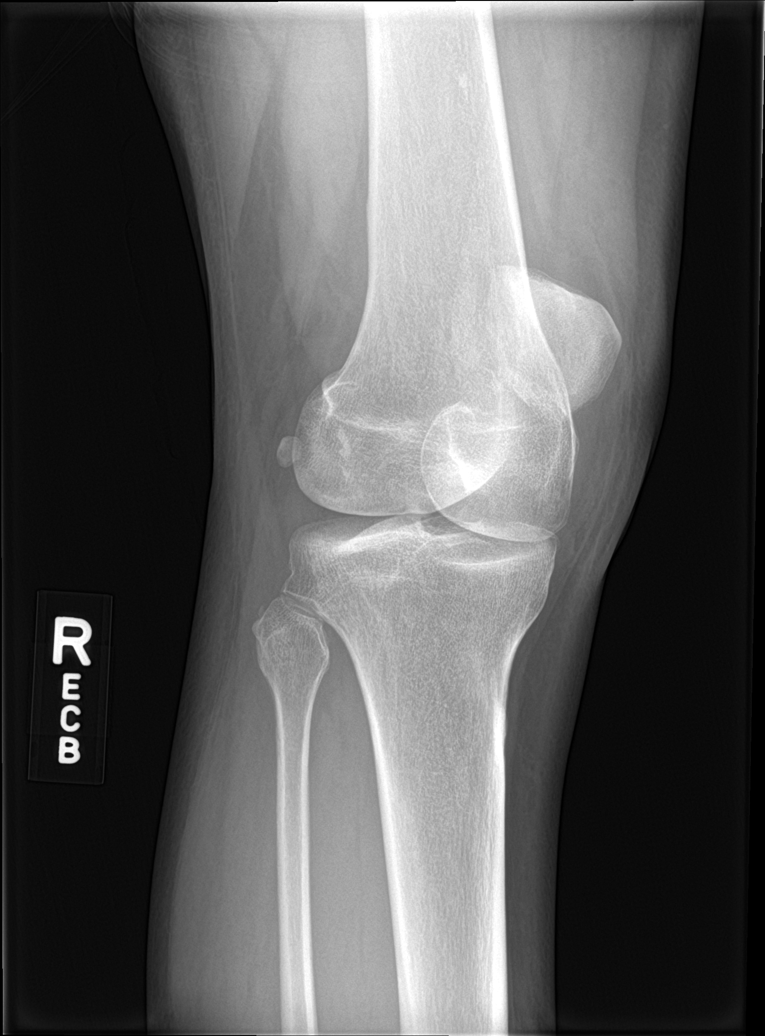

[knee obl (2 of 3)]
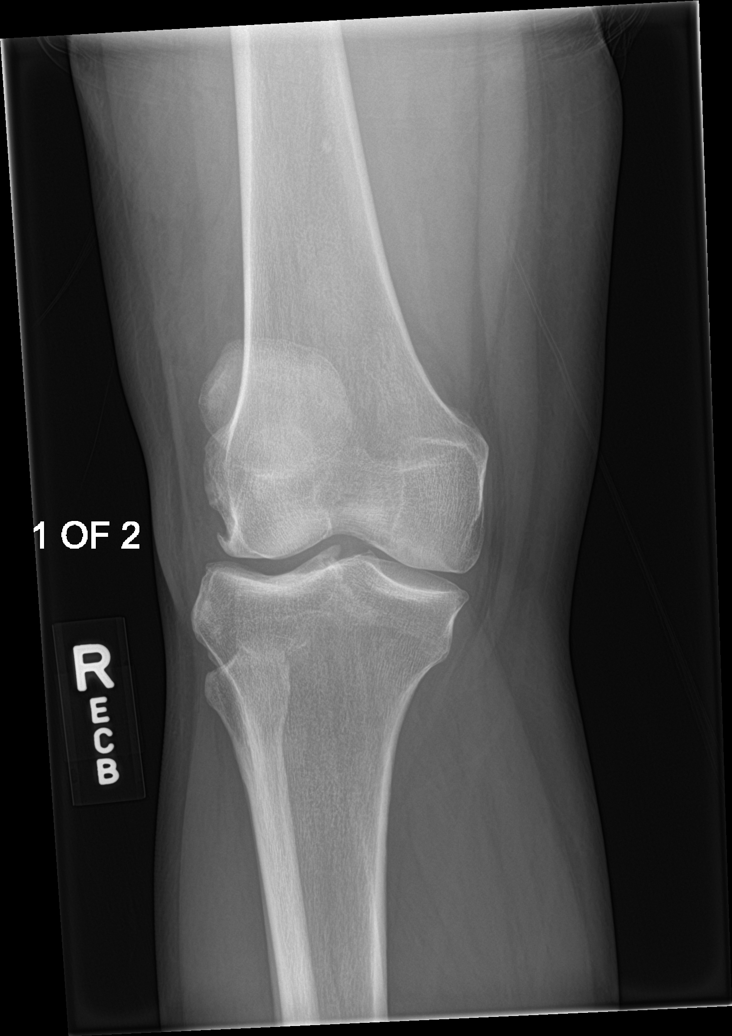

[knee obl (3 of 3)]
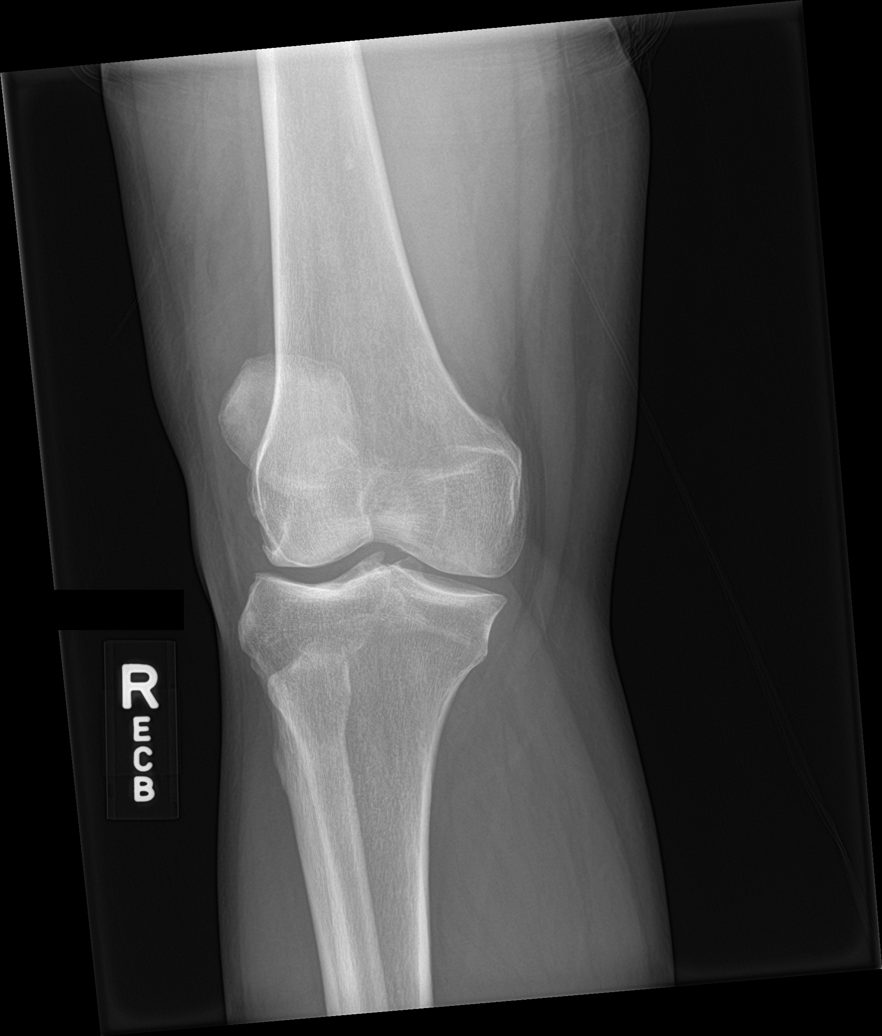

[5 of 5 positions shown; findings below may reference images not displayed]

FINDINGS: No acute fracture deformity or dislocation. Moderate
tricompartmental joint space narrowing with periarticular sclerosis
and marginal spurring. No destructive bony lesions. Soft tissue
planes are nonsuspicious.
IMPRESSION: Moderate tricompartmental osteoarthrosis without acute osseous
process.

## 2018-08-20 ENCOUNTER — Encounter: Payer: Self-pay | Admitting: Nurse Practitioner

## 2018-08-20 ENCOUNTER — Ambulatory Visit (INDEPENDENT_AMBULATORY_CARE_PROVIDER_SITE_OTHER): Payer: 59 | Admitting: Nurse Practitioner

## 2018-08-20 VITALS — BP 130/80 | HR 88 | Ht 69.0 in | Wt 182.0 lb

## 2018-08-20 DIAGNOSIS — L089 Local infection of the skin and subcutaneous tissue, unspecified: Secondary | ICD-10-CM | POA: Diagnosis not present

## 2018-08-20 DIAGNOSIS — L723 Sebaceous cyst: Secondary | ICD-10-CM | POA: Diagnosis not present

## 2018-08-20 DIAGNOSIS — L02411 Cutaneous abscess of right axilla: Secondary | ICD-10-CM

## 2018-08-20 MED ORDER — DOXYCYCLINE HYCLATE 100 MG PO TABS: 100.0000 mg | ORAL_TABLET | Freq: Two times a day (BID) | ORAL | 0 refills | Status: DC

## 2018-08-20 NOTE — Progress Notes (Signed)
Name: Debra Wade   MRN: [id number]    DOB: [birth year]   Date:08/20/2018       Progress Note  Subjective  Chief Complaint Abscess  HPI Debra Wade is here today for evaluation of a boil to right axilla, first noticed on Tuesday of this week, has tried warm compresses, salt water soaks,  Boil ease with no improvement. The boil is painful, red and swollen. The boil has not drained any exudate. She denies fevers, chills. shes had boils in the past but they always cleared on their own. She was on antibiotic course last week for dental infection.  Patient Active Problem List   Diagnosis Date Noted  . Tobacco abuse 07/06/2017  . Essential hypertension 06/09/2017  . Fatigue 06/09/2017  . Snoring 06/09/2017    Social History   Tobacco Use  . Smoking status: Current Every Day Smoker    Packs/day: 0.30    Years: 24.00    Pack years: 7.20    Types: Cigarettes  . Smokeless tobacco: Never Used  Substance Use Topics  . Alcohol use: Yes    Comment: occ.      Current Outpatient Medications:  .  carvedilol (COREG) 25 MG tablet, Take 1 tablet (25 mg total) by mouth 2 (two) times daily. Please make overdue yearly appt with Dr. Radford Pax before anymore refills.1st attempt, Disp: 60 tablet, Rfl: 0 .  clindamycin (CLEOCIN) 300 MG capsule, TAKE 1 CAPSULE BY MOUTH EVERY 6 HOURS FOR 10 DAYS, Disp: , Rfl: 0 .  ibuprofen (ADVIL,MOTRIN) 600 MG tablet, Take 600 mg by mouth every 8 (eight) hours as needed. for pain, Disp: , Rfl: 0 .  losartan (COZAAR) 100 MG tablet, Take 1 tablet (100 mg total) by mouth daily. Please make overdue yearly appt with Dr. Radford Pax before anymore refills. 1st attempt, Disp: 30 tablet, Rfl: 0 .  nicotine polacrilex (NICORETTE) 4 MG gum, Take 1 each (4 mg total) by mouth as needed for smoking cessation., Disp: 100 each, Rfl: 1 .  spironolactone (ALDACTONE) 100 MG tablet, Take 1 tablet (100 mg total) by mouth daily. Please make overdue yearly appt with Dr. Radford Pax before anymore refills.  1st attempt, Disp: 30 tablet, Rfl: 0 .  triamcinolone cream (KENALOG) 0.1 %, Apply 1 application topically 2 (two) times daily., Disp: 30 g, Rfl: 0  Allergies  Allergen Reactions  . Sulfa Drugs Cross Reactors Anaphylaxis, Swelling and Rash    ROS  No other specific complaints in a complete review of systems (except as listed in HPI above).  Objective  Vitals:   08/20/18 0814  BP: 130/80  Pulse: 88  SpO2: 97%  Weight: 182 lb (82.6 kg)  Height: 5\' 9"  (1.753 m)    Body mass index is 26.88 kg/m.  Nursing Note and Vital Signs reviewed.  Physical Exam  Constitutional: Patient appears well-developed and well-nourished.  No distress.  HEENT: head atraumatic, normocephalic, pupils equal and reactive to light, EOM's intact, neck supple, oropharynx pink and moist without exudate Cardiovascular: Normal rate, regular rhythm, distal pulses intact. Pulmonary/Chest: Effort normal, No respiratory distress or retractions. Neurological: She is alert and oriented to person, place, and time. No cranial nerve deficit. Coordination, balance, strength, speech and gait are normal.  Skin: Skin is warm and dry. Erythematous, firm, swollen furuncle to right axilla without exudate or pus noted. Psychiatric: Patient has a normal mood and affect. behavior is normal. Judgment and thought content normal.   Assessment & Plan  1. Abscess of axilla, right Deep, firm  boil is noted to right axilla, I am not sure it is drainable at this point I will start course of doxycyline today-dosing and side effects discussed STAT referral to surgery for further evaluation -home management, Red flags and when to present for emergency care or RTC including fever >101.57F,  new/worsening/un-resolving symptoms, reviewed with patient at time of visit. Follow up and care instructions discussed and provided in AVS. - doxycycline (VIBRA-TABS) 100 MG tablet; Take 1 tablet (100 mg total) by mouth 2 (two) times daily.  Dispense:  20 tablet; Refill: 0 - Ambulatory referral to General Surgery

## 2018-08-20 NOTE — Patient Instructions (Addendum)
Please start doxycyline 100mg  twice daily for 10 days. I have placed a referral to surgery for further evaluation.   Skin Abscess A skin abscess is an infected area on or under your skin that contains pus and other material. An abscess can happen almost anywhere on your body. Some abscesses break open (rupture) on their own. Most continue to get worse unless they are treated. The infection can spread deeper into the body and into your blood, which can make you feel sick. Treatment usually involves draining the abscess. Follow these instructions at home: Abscess Care  If you have an abscess that has not drained, place a warm, clean, wet washcloth over the abscess several times a day. Do this as told by your doctor.  Follow instructions from your doctor about how to take care of your abscess. Make sure you: ? Cover the abscess with a bandage (dressing). ? Change your bandage or gauze as told by your doctor. ? Wash your hands with soap and water before you change the bandage or gauze. If you cannot use soap and water, use hand sanitizer.  Check your abscess every day for signs that the infection is getting worse. Check for: ? More redness, swelling, or pain. ? More fluid or blood. ? Warmth. ? More pus or a bad smell. Medicines   Take over-the-counter and prescription medicines only as told by your doctor.  If you were prescribed an antibiotic medicine, take it as told by your doctor. Do not stop taking the antibiotic even if you start to feel better. General instructions  To avoid spreading the infection: ? Do not share personal care items, towels, or hot tubs with others. ? Avoid making skin-to-skin contact with other people.  Keep all follow-up visits as told by your doctor. This is important. Contact a doctor if:  You have more redness, swelling, or pain around your abscess.  You have more fluid or blood coming from your abscess.  Your abscess feels warm when you touch  it.  You have more pus or a bad smell coming from your abscess.  You have a fever.  Your muscles ache.  You have chills.  You feel sick. Get help right away if:  You have very bad (severe) pain.  You see red streaks on your skin spreading away from the abscess. This information is not intended to replace advice given to you by your health care provider. Make sure you discuss any questions you have with your health care provider. Document Released: 05/12/2008 Document Revised: 07/20/2016 Document Reviewed: 10/03/2015 Elsevier Interactive Patient Education  [employer].

## 2018-08-21 ENCOUNTER — Other Ambulatory Visit: Payer: Self-pay | Admitting: Cardiology

## 2018-09-15 ENCOUNTER — Ambulatory Visit (INDEPENDENT_AMBULATORY_CARE_PROVIDER_SITE_OTHER): Payer: 59 | Admitting: Internal Medicine

## 2018-09-15 ENCOUNTER — Encounter: Payer: Self-pay | Admitting: Internal Medicine

## 2018-09-15 VITALS — BP 130/88 | HR 72 | Temp 98.6°F | Resp 16 | Ht 69.0 in | Wt 181.8 lb

## 2018-09-15 DIAGNOSIS — Q383 Other congenital malformations of tongue: Secondary | ICD-10-CM

## 2018-09-15 NOTE — Patient Instructions (Signed)
  Medications reviewed and updated.  Changes include :   none    Call if no improvement

## 2018-09-15 NOTE — Assessment & Plan Note (Signed)
She was mostly concerned about the large bumps on the back of her tongue, which she has not noticed previously-they feel dry and itchy Normal-appearing tongue-normal lingular tonsils and posterior tongue Tongue does have a slight-white coloration-?  Mild thrush She does not have any other concerning symptoms so we will hold off on treatment, but she did have 2 antibiotics last month-completed on 2 weeks ago approximately She will monitor her tongue symptoms and if there is no improvement or anything worsen she will let me know and will consider nystatin

## 2018-09-15 NOTE — Progress Notes (Signed)
Subjective:    Patient ID: Debra Wade, female    DOB: 1970/12/30, 47 y.o.   MRN: [id number]  HPI The patient is here for an acute visit.  For a while she has had some sinus issues and mild nasal congestion.  She has a cough related to the PND.  This is not new.  She takes otc medications as needed.  She does sleep with her mouth open and her mouth and tongue are dry.  Today when she looked in the back of her mouth she saw bumps on the back of her tongue that she never noticed before.  They are itchy and dry.  That is the main reason she is here.   She denies sore throat, trouble swallowing and fever.  She has been on two antibiotics last month - she completed the last one a couple of weeks ago.  She took them for a dental infection.     Medications and allergies reviewed with patient and updated if appropriate.  Patient Active Problem List   Diagnosis Date Noted  . Tobacco abuse 07/06/2017  . Essential hypertension 06/09/2017  . Fatigue 06/09/2017  . Snoring 06/09/2017    Current Outpatient Medications on File Prior to Visit  Medication Sig Dispense Refill  . carvedilol (COREG) 25 MG tablet Take 1 tablet (25 mg total) by mouth 2 (two) times daily. 30 tablet 0  . clindamycin (CLEOCIN) 300 MG capsule TAKE 1 CAPSULE BY MOUTH EVERY 6 HOURS FOR 10 DAYS  0  . ibuprofen (ADVIL,MOTRIN) 600 MG tablet Take 600 mg by mouth every 8 (eight) hours as needed. for pain  0  . losartan (COZAAR) 100 MG tablet Take 1 tablet (100 mg total) by mouth daily. 15 tablet 0  . nicotine polacrilex (NICORETTE) 4 MG gum Take 1 each (4 mg total) by mouth as needed for smoking cessation. 100 each 1  . spironolactone (ALDACTONE) 100 MG tablet Take 1 tablet (100 mg total) by mouth daily. 15 tablet 0  . triamcinolone cream (KENALOG) 0.1 % Apply 1 application topically 2 (two) times daily. 30 g 0   No current facility-administered medications on file prior to visit.     Past Medical History:  Diagnosis Date  .  Arthritis   . Chicken pox   . Frequent headaches   . GERD (gastroesophageal reflux disease)   . Hypertension   . Stroke ([place])   . Syncope     Past Surgical History:  Procedure Laterality Date  . TUBAL LIGATION      Social History   Socioeconomic History  . Marital status: Single    Spouse name: Not on file  . Number of children: 5  . Years of education: 70  . Highest education level: Not on file  Occupational History  . Occupation: Med [employer]  . Financial resource strain: Not on file  . Food insecurity:    Worry: Not on file    Inability: Not on file  . Transportation needs:    Medical: Not on file    Non-medical: Not on file  Tobacco Use  . Smoking status: Current Every Day Smoker    Packs/day: 0.30    Years: 24.00    Pack years: 7.20    Types: Cigarettes  . Smokeless tobacco: Never Used  Substance and Sexual Activity  . Alcohol use: Yes    Comment: occ.   . Drug use: No  . Sexual activity: Not on file  Lifestyle  .  Physical activity:    Days per week: Not on file    Minutes per session: Not on file  . Stress: Not on file  Relationships  . Social connections:    Talks on phone: Not on file    Gets together: Not on file    Attends religious service: Not on file    Active member of club or organization: Not on file    Attends meetings of clubs or organizations: Not on file    Relationship status: Not on file  Other Topics Concern  . Not on file  Social History Narrative   Fun/Hobby: [place], anything relaxing   Denies abuse and feels safe at home.    Family History  Problem Relation Age of Onset  . Hypertension Mother   . Hypertension Father   . Heart disease Father   . Breast cancer Maternal Grandmother   . Prostate cancer Paternal Grandfather     Review of Systems  Constitutional: Negative for chills and fever.  HENT: Positive for postnasal drip. Negative for congestion, ear pain, sinus pressure, sinus pain, sore throat and trouble  swallowing.   Respiratory: Positive for cough (PND related). Negative for shortness of breath and wheezing.   Neurological: Negative for light-headedness and headaches.       Objective:   Vitals:   09/15/18 1422  BP: 130/88  Pulse: 72  Resp: 16  Temp: 98.6 F (37 C)  SpO2: 98%   BP Readings from Last 3 Encounters:  09/15/18 130/88  08/20/18 130/80  04/06/18 122/82   Wt Readings from Last 3 Encounters:  09/15/18 181 lb 12.8 oz (82.5 kg)  08/20/18 182 lb (82.6 kg)  02/18/18 185 lb (83.9 kg)   Body mass index is 26.85 kg/m.   Physical Exam  Constitutional: She appears well-developed and well-nourished.  Non-toxic appearance. She does not appear ill. No distress.  HENT:  Head: Normocephalic and atraumatic.  Mouth/Throat: Oropharynx is clear and moist. No oral lesions (tongue appears normal - prominent lingual tonsils, whitish color to tongue ). No oropharyngeal exudate or posterior oropharyngeal erythema.  Neck: No thyromegaly present.  Lymphadenopathy:    She has no cervical adenopathy.  Skin: Skin is warm and dry. No erythema.           Assessment & Plan:    See Problem List for Assessment and Plan of chronic medical problems.

## 2018-09-27 ENCOUNTER — Other Ambulatory Visit: Payer: Self-pay | Admitting: Cardiology

## 2018-09-27 ENCOUNTER — Other Ambulatory Visit: Payer: Self-pay | Admitting: Nurse Practitioner

## 2018-09-27 DIAGNOSIS — L02411 Cutaneous abscess of right axilla: Secondary | ICD-10-CM

## 2018-09-27 MED ORDER — SPIRONOLACTONE 100 MG PO TABS: 100.0000 mg | ORAL_TABLET | Freq: Every day | ORAL | 0 refills | Status: DC

## 2018-09-27 NOTE — Telephone Encounter (Signed)
New Message           *STAT* If patient is at the pharmacy, call can be transferred to refill team.   1. Which medications need to be refilled? (please list name of each medication and dose if known) Spironolactone 100mg   2. Which pharmacy/location (including street and city if local pharmacy) is medication to be sent to? [facility] [phone]  3. Do they need a 30 day or 90 day supply? 30   Patient is needing 90 day supply, she is currently out of this medication and would like 30 days sent to above pharmacy. Then the rest in mail order. [facility] [phone]

## 2018-09-27 NOTE — Telephone Encounter (Signed)
Pt's medication was sent to pt's pharmacies as requested. Confirmation received.  

## 2018-11-16 ENCOUNTER — Encounter: Payer: Self-pay | Admitting: Family Medicine

## 2018-11-16 ENCOUNTER — Ambulatory Visit (INDEPENDENT_AMBULATORY_CARE_PROVIDER_SITE_OTHER): Payer: 59 | Admitting: Family Medicine

## 2018-11-16 VITALS — BP 120/90 | HR 97 | Temp 98.0°F | Ht 69.0 in | Wt 179.2 lb

## 2018-11-16 DIAGNOSIS — A084 Viral intestinal infection, unspecified: Secondary | ICD-10-CM | POA: Insufficient documentation

## 2018-11-16 MED ORDER — ONDANSETRON 4 MG PO TBDP: 4.0000 mg | ORAL_TABLET | Freq: Three times a day (TID) | ORAL | 0 refills | Status: DC | PRN

## 2018-11-16 NOTE — Assessment & Plan Note (Signed)
Symptoms seem be consistent with a viral etiology.  No fevers. -Zofran provided for nausea. -Counseled on supportive care. -Given indications to return. -Wrote out a work for today and Architectural technologist.

## 2018-11-16 NOTE — Patient Instructions (Signed)
Nice to meet you  Please try Imodium for the diarrhea. Please try Zofran for the nausea. Please try to have a bland diet and drink plenty of fluids. Please see [place] back if you have any worsening of symptoms that lasts longer than 7 days.

## 2018-11-16 NOTE — Progress Notes (Signed)
Debra Wade - 47 y.o. female MRN [id number]  Date of birth: 04-Nov-1971  SUBJECTIVE:  Including CC & ROS.  Chief Complaint  Patient presents with  . Emesis    body aches, nausea, and sweat x2 days    Debra Wade is a 47 y.o. female that is presenting with emesis and diarrhea.  The diarrhea started on Sunday.  It is been watery and nonbloody.  She feels like similar symptoms occurred after she ate a hot dog.  Denies any fevers.  She does have some body aches and chills.  Denies any rashes.  Has not had any new medications or travel outside the country.  Did not receive the flu vaccine.  She works in a nursing facility.  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    Review of Systems  Constitutional: Negative for fever.  HENT: Negative for congestion.   Respiratory: Negative for cough.   Cardiovascular: Negative for chest pain.  Gastrointestinal: Positive for diarrhea, nausea and vomiting. Negative for abdominal pain.  Musculoskeletal: Positive for myalgias. Negative for arthralgias.  Skin: Negative for color change.    HISTORY: Past Medical, Surgical, Social, and Family History Reviewed & Updated per EMR.   Pertinent Historical Findings include:  Past Medical History:  Diagnosis Date  . Arthritis   . Chicken pox   . Frequent headaches   . GERD (gastroesophageal reflux disease)   . Hypertension   . Stroke ([place])   . Syncope     Past Surgical History:  Procedure Laterality Date  . TUBAL LIGATION      Allergies  Allergen Reactions  . Sulfa Drugs Cross Reactors Anaphylaxis, Swelling and Rash    Family History  Problem Relation Age of Onset  . Hypertension Mother   . Hypertension Father   . Heart disease Father   . Breast cancer Maternal Grandmother   . Prostate cancer Paternal Grandfather      Social History   Socioeconomic History  . Marital status: Single    Spouse name: Not on file  . Number of children: 5  . Years of education: 42  . Highest  education level: Not on file  Occupational History  . Occupation: Med [employer]  . Financial resource strain: Not on file  . Food insecurity:    Worry: Not on file    Inability: Not on file  . Transportation needs:    Medical: Not on file    Non-medical: Not on file  Tobacco Use  . Smoking status: Current Every Day Smoker    Packs/day: 0.30    Years: 24.00    Pack years: 7.20    Types: Cigarettes  . Smokeless tobacco: Never Used  Substance and Sexual Activity  . Alcohol use: Yes    Comment: occ.   . Drug use: No  . Sexual activity: Not on file  Lifestyle  . Physical activity:    Days per week: Not on file    Minutes per session: Not on file  . Stress: Not on file  Relationships  . Social connections:    Talks on phone: Not on file    Gets together: Not on file    Attends religious service: Not on file    Active member of club or organization: Not on file    Attends meetings of clubs or organizations: Not on file    Relationship status: Not on file  . Intimate partner violence:    Fear of current  or ex partner: Not on file    Emotionally abused: Not on file    Physically abused: Not on file    Forced sexual activity: Not on file  Other Topics Concern  . Not on file  Social History Narrative   Fun/Hobby: [place], anything relaxing   Denies abuse and feels safe at home.     PHYSICAL EXAM:  VS: BP 120/90 (BP Location: Right Arm, Patient Position: Sitting, Cuff Size: Normal)   Pulse 97   Temp 98 F (36.7 C) (Oral)   Ht 5\' 9"  (1.753 m)   Wt 179 lb 3.2 oz (81.3 kg)   SpO2 97%   BMI 26.46 kg/m  Physical Exam Gen: NAD, alert, cooperative with exam,  ENT: normal lips, normal nasal mucosa,  Eye: normal EOM, normal conjunctiva and lids CV:  no edema, +2 pedal pulses, regular rate, S1-S2 Resp: no accessory muscle use, non-labored, clear to auscultation bilaterally GI: no masses or tenderness, no hernia, positive bowel sounds, soft Skin: no rashes, no areas  of induration  Neuro: normal tone, normal sensation to touch Psych:  normal insight, alert and oriented MSK: normal gait, normal strength       ASSESSMENT & PLAN:   Viral gastroenteritis Symptoms seem be consistent with a viral etiology.  No fevers. -Zofran provided for nausea. -Counseled on supportive care. -Given indications to return. -Wrote out a work for today and Architectural technologist.

## 2018-11-20 ENCOUNTER — Other Ambulatory Visit: Payer: Self-pay | Admitting: Cardiology

## 2018-11-22 ENCOUNTER — Other Ambulatory Visit: Payer: Self-pay | Admitting: Cardiology

## 2019-01-09 DIAGNOSIS — I951 Orthostatic hypotension: Secondary | ICD-10-CM | POA: Insufficient documentation

## 2019-01-09 NOTE — Progress Notes (Signed)
Cardiology Office Note:    Date:  01/10/2019   ID:  Debra Wade, DOB [birth year], MRN [id number]  PCP:  Lance Sell, NP  Cardiologist:  No primary care provider on file.    Referring MD: Lance Sell, NP   Chief Complaint  Patient presents with  . Hypertension    History of Present Illness:    Debra Wade is a 48 y.o. female with a hx of HAs, HTN and CVA.  She has not tolerated amlodipine due to LE edema dn had syncope and orthostatic hypotension after taking metoprolol and Losartan HCT.  She also has a history of preeclampsia.  When I saw her last she was on Losartan with no diuretic and I started her on carvedilol as well.  She was seen back in HTN clinic and Carvedilol was increased further to 6.39m BID.  She was encouraged to use compression hose when standing for long periods of time at work. She has seen back several times in HTN clinic and Carvedilol titrated for BP goal < 130/839mg.  She was started on spironolactone 2578maily and subsequently increased to 2m59mily.  Carvedilol eventually increased to 25mg66m.  SHe was seen back in 02/2018 and spiro increased to 100mg 36my.  Repeat OV 03/2018 showed BP had reached goal and patient was tolerating meds well.   She is here today for followup and is doing well.  She denies any chest pain or pressure, SOB, DOE, PND, orthopnea, LE edema, dizziness, palpitations or syncope. She is compliant with her meds and is tolerating meds with no SE.  She tells me that she changed jobs and is also tried to stop worrying so much about her children as they are grown.  The release of stress has significantly helped her blood pressure.  She tells me now that she only takes carvedilol 25 mg once daily and is stop the losartan.  She is continue on the spironolactone 100 mg daily and her blood pressures have been less than 120/90 mmHg.  She has not taken her medicines yet today.  Past Medical History:  Diagnosis Date  . Arthritis   .  Chicken pox   . Frequent headaches   . GERD (gastroesophageal reflux disease)   . Hypertension   . Stroke (HCC)  [place]     Past Surgical History:  Procedure Laterality Date  . TUBAL LIGATION      Current Medications: Current Meds  Medication Sig  . carvedilol (COREG) 25 MG tablet Take 1 tablet (25 mg total) by mouth 2 (two) times daily. Please keep upcoming appt in February for future refills. Thank you  . losartan (COZAAR) 100 MG tablet Take 1 tablet (100 mg total) by mouth daily. Please keep upcoming appt in February for future refills. Thank you  . spironolactone (ALDACTONE) 100 MG tablet Take 1 tablet (100 mg total) by mouth daily. Please keep upcoming appt in February with Dr. TurnerRadford Paxe anymore refills. Thank you     Allergies:   Sulfa drugs cross reactors   Social History   Socioeconomic History  . Marital status: Single    Spouse name: Not on file  . Number of children: 5  . Years of education: 11  . 56ghest education level: Not on file  Occupational History  . Occupation: Med Tech  [employer] resource strain: Not on file  . Food insecurity:    Worry: Not on file    Inability: Not  on file  . Transportation needs:    Medical: Not on file    Non-medical: Not on file  Tobacco Use  . Smoking status: Current Every Day Smoker    Packs/day: 0.30    Years: 24.00    Pack years: 7.20    Types: Cigarettes  . Smokeless tobacco: Never Used  Substance and Sexual Activity  . Alcohol use: Yes    Comment: occ.   . Drug use: No  . Sexual activity: Not on file  Lifestyle  . Physical activity:    Days per week: Not on file    Minutes per session: Not on file  . Stress: Not on file  Relationships  . Social connections:    Talks on phone: Not on file    Gets together: Not on file    Attends religious service: Not on file    Active member of club or organization: Not on file    Attends meetings of clubs or organizations: Not on file     Relationship status: Not on file  Other Topics Concern  . Not on file  Social History Narrative   Fun/Hobby: [place], anything relaxing   Denies abuse and feels safe at home.     Family History: The patient's family history includes Breast cancer in her maternal grandmother; Heart disease in her father; Hypertension in her father and mother; Prostate cancer in her paternal grandfather.  ROS:   Please see the history of present illness.    ROS  All other systems reviewed and negative.   EKGs/Labs/Other Studies Reviewed:    The following studies were reviewed today: none  EKG:  EKG is  ordered today.  The ekg ordered today demonstrates normal sinus rhythm at 73 bpm with no ST changes.  Recent Labs: 04/06/2018: BUN 14; Creatinine, Ser 1.07; Potassium 4.3; Sodium 137   Recent Lipid Panel    Component Value Date/Time   CHOL 207 (H) 07/13/2008 2018   TRIG 76 07/13/2008 2018   HDL 52 07/13/2008 2018   CHOLHDL 4.0 Ratio 07/13/2008 2018   VLDL 15 07/13/2008 2018   LDLCALC 140 (H) 07/13/2008 2018    Physical Exam:    VS:  BP 120/90   Pulse 73   Ht '5\' 9"'  (1.753 m)   Wt 184 lb 9.6 oz (83.7 kg)   SpO2 98%   BMI 27.26 kg/m     Wt Readings from Last 3 Encounters:  01/10/19 184 lb 9.6 oz (83.7 kg)  11/16/18 179 lb 3.2 oz (81.3 kg)  09/15/18 181 lb 12.8 oz (82.5 kg)     GEN:  Well nourished, well developed in no acute distress HEENT: Normal NECK: No JVD; No carotid bruits LYMPHATICS: No lymphadenopathy CARDIAC: RRR, no murmurs, rubs, gallops RESPIRATORY:  Clear to auscultation without rales, wheezing or rhonchi  ABDOMEN: Soft, non-tender, non-distended MUSCULOSKELETAL:  No edema; No deformity  SKIN: Warm and dry NEUROLOGIC:  Alert and oriented x 3 PSYCHIATRIC:  Normal affect   ASSESSMENT:    1. Essential hypertension   2. Orthostatic hypotension    PLAN:    In order of problems listed above:  1.  HTN - BP is well controlled on exam.  She is tolerating her meds  well.  She will continue on Carvedilol 32m daily spironolactone 100 mg daily..  I have encouraged her to continue a 2gm Na diet, continue exercise and be compliant with her meds.  Check a be met today.  2.  Orthostatic hypotension - she  has not had any problems with dizziness or syncope and I encouraged her to continue to wear compression hose when standing at her job working for long periods of time.   Medication Adjustments/Labs and Tests Ordered: Current medicines are reviewed at length with the patient today.  Concerns regarding medicines are outlined above.  No orders of the defined types were placed in this encounter.  No orders of the defined types were placed in this encounter.   Signed, Fransico Him, MD  01/10/2019 8:17 AM    [place]

## 2019-01-10 ENCOUNTER — Encounter (INDEPENDENT_AMBULATORY_CARE_PROVIDER_SITE_OTHER): Payer: Self-pay

## 2019-01-10 ENCOUNTER — Encounter: Payer: Self-pay | Admitting: Cardiology

## 2019-01-10 ENCOUNTER — Ambulatory Visit (INDEPENDENT_AMBULATORY_CARE_PROVIDER_SITE_OTHER): Payer: 59 | Admitting: Cardiology

## 2019-01-10 VITALS — BP 120/90 | HR 73 | Ht 69.0 in | Wt 184.6 lb

## 2019-01-10 DIAGNOSIS — I10 Essential (primary) hypertension: Secondary | ICD-10-CM

## 2019-01-10 DIAGNOSIS — I951 Orthostatic hypotension: Secondary | ICD-10-CM

## 2019-01-10 LAB — BASIC METABOLIC PANEL
BUN/Creatinine Ratio: 8 — ABNORMAL LOW (ref 9–23)
BUN: 8 mg/dL (ref 6–24)
CO2: 22 mmol/L (ref 20–29)
Calcium: 9.2 mg/dL (ref 8.7–10.2)
Chloride: 103 mmol/L (ref 96–106)
Creatinine, Ser: 0.98 mg/dL (ref 0.57–1.00)
GFR calc Af Amer: 81 mL/min/{1.73_m2} (ref >59)
GFR calc non Af Amer: 70 mL/min/{1.73_m2} (ref >59)
Glucose: 88 mg/dL (ref 65–99)
Potassium: 4 mmol/L (ref 3.5–5.2)
Sodium: 140 mmol/L (ref 134–144)

## 2019-01-10 MED ORDER — SPIRONOLACTONE 100 MG PO TABS: 100.0000 mg | ORAL_TABLET | Freq: Every day | ORAL | 3 refills | Status: DC

## 2019-01-10 MED ORDER — CARVEDILOL 25 MG PO TABS: 25.0000 mg | ORAL_TABLET | Freq: Two times a day (BID) | ORAL | 3 refills | Status: DC

## 2019-01-10 MED ORDER — LOSARTAN POTASSIUM 100 MG PO TABS: 100.0000 mg | ORAL_TABLET | Freq: Every day | ORAL | 3 refills | Status: DC

## 2019-01-10 NOTE — Patient Instructions (Signed)
Medication Instructions:  Your physician recommends that you continue on your current medications as directed. Please refer to the Current Medication list given to you today.  If you need a refill on your cardiac medications before your next appointment, please call your pharmacy.   Lab work: Today: BMET  If you have labs (blood work) drawn today and your tests are completely normal, you will receive your results only by: Marland Kitchen MyChart Message (if you have MyChart) OR . A paper copy in the mail If you have any lab test that is abnormal or we need to change your treatment, we will call you to review the results.  Testing/Procedures: None  Follow-Up: At [facility], you and your health needs are our priority.  As part of our continuing mission to provide you with exceptional heart care, we have created designated Provider Care Teams.  These Care Teams include your primary Cardiologist (physician) and Advanced Practice Providers (APPs -  Physician Assistants and Nurse Practitioners) who all work together to provide you with the care you need, when you need it.  Your physician wants you to follow-up in: 6 months with PA. You will receive a reminder letter in the mail two months in advance. If you don't receive a letter, please call our office to schedule the follow-up appointment.   You will need a follow up appointment in 1 years.  Please call our office 2 months in advance to schedule this appointment.  You may see Dr. Radford Pax or one of the following Advanced Practice Providers on your designated Care Team:   [place], PA-C Melina Copa, PA-C . Ermalinda Barrios, PA-C

## 2019-08-02 ENCOUNTER — Telehealth: Payer: Self-pay | Admitting: Cardiology

## 2019-08-02 NOTE — Telephone Encounter (Signed)
New Message    *STAT* If patient is at the pharmacy, call can be transferred to refill team.   1. Which medications need to be refilled? (please list name of each medication and dose if known) carvedilol (COREG) 25 MG tablet   spironolactone (ALDACTONE) 100 MG tablet   2. Which pharmacy/location (including street and city if local pharmacy) is medication to be sent to? [place] [place]  3. Do they need a 30 day or 90 day supply? 90 day

## 2019-08-03 NOTE — Telephone Encounter (Signed)
Called pt to inform her that her medication has refills at [facility] mail order pharmacy and that she needed to give them a call and request a refill and if she has any other problems, questions or concerns to call the office. Pt verbalized understanding.

## 2019-08-09 ENCOUNTER — Encounter (HOSPITAL_COMMUNITY): Payer: Self-pay

## 2019-08-09 ENCOUNTER — Other Ambulatory Visit: Payer: Self-pay

## 2019-08-09 ENCOUNTER — Ambulatory Visit (HOSPITAL_COMMUNITY)
Admission: EM | Admit: 2019-08-09 | Discharge: 2019-08-09 | Disposition: A | Discharge status: Home / Self Care | Payer: 59 | Attending: Family Medicine | Admitting: Family Medicine

## 2019-08-09 DIAGNOSIS — Z1159 Encounter for screening for other viral diseases: Secondary | ICD-10-CM

## 2019-08-09 DIAGNOSIS — Z20828 Contact with and (suspected) exposure to other viral communicable diseases: Secondary | ICD-10-CM | POA: Insufficient documentation

## 2019-08-09 DIAGNOSIS — I10 Essential (primary) hypertension: Secondary | ICD-10-CM | POA: Diagnosis not present

## 2019-08-09 DIAGNOSIS — J069 Acute upper respiratory infection, unspecified: Secondary | ICD-10-CM

## 2019-08-09 DIAGNOSIS — Z79899 Other long term (current) drug therapy: Secondary | ICD-10-CM | POA: Diagnosis not present

## 2019-08-09 DIAGNOSIS — F17210 Nicotine dependence, cigarettes, uncomplicated: Secondary | ICD-10-CM | POA: Diagnosis not present

## 2019-08-09 DIAGNOSIS — K219 Gastro-esophageal reflux disease without esophagitis: Secondary | ICD-10-CM | POA: Diagnosis not present

## 2019-08-09 DIAGNOSIS — Z8673 Personal history of transient ischemic attack (TIA), and cerebral infarction without residual deficits: Secondary | ICD-10-CM | POA: Diagnosis not present

## 2019-08-09 DIAGNOSIS — M1990 Unspecified osteoarthritis, unspecified site: Secondary | ICD-10-CM | POA: Insufficient documentation

## 2019-08-09 DIAGNOSIS — B9789 Other viral agents as the cause of diseases classified elsewhere: Secondary | ICD-10-CM | POA: Diagnosis not present

## 2019-08-09 DIAGNOSIS — R112 Nausea with vomiting, unspecified: Secondary | ICD-10-CM | POA: Diagnosis not present

## 2019-08-09 MED ORDER — IBUPROFEN 600 MG PO TABS: 600.0000 mg | ORAL_TABLET | Freq: Four times a day (QID) | ORAL | 0 refills | Status: DC | PRN

## 2019-08-09 MED ORDER — ONDANSETRON 4 MG PO TBDP: 4.0000 mg | ORAL_TABLET | Freq: Three times a day (TID) | ORAL | 0 refills | Status: DC | PRN

## 2019-08-09 MED ORDER — ONDANSETRON 4 MG PO TBDP
4.0000 mg | ORAL_TABLET | Freq: Once | ORAL | Status: AC
  Administered 2019-08-09: 4 mg via ORAL

## 2019-08-09 MED ORDER — ONDANSETRON 4 MG PO TBDP
ORAL_TABLET | ORAL | Status: AC
  Filled 2019-08-09: qty 1

## 2019-08-09 MED ORDER — BENZONATATE 200 MG PO CAPS: 200.0000 mg | ORAL_CAPSULE | Freq: Three times a day (TID) | ORAL | 0 refills | Status: AC | PRN

## 2019-08-09 NOTE — ED Provider Notes (Signed)
[place]    CSN: [id number] Arrival date & time: 08/09/19  [id number]      History   Chief Complaint Chief Complaint  Patient presents with  . Emesis    HPI Debra Wade is a 48 y.o. female history of hypertension, GERD, previous CVA, tobacco use, presenting today for evaluation of nausea vomiting and URI symptoms.  Patient states that yesterday morning she developed nausea and vomiting.  She has had intermittent abdominal pain which feels more like cramping.  Denies currently at time of visit.  She has been able to keep down a small amount of fluids and crackers with using Zofran.  Has a few tablets left of this at home.  She has also had cough and congestion for approximately 4 to 5 days which began last week.  Denies any fevers.  Denies known exposure to COVID.  Denies close contacts with similar symptoms.  Denies chest pain or shortness of breath.  Has been using Zyrtec.  HPI  Past Medical History:  Diagnosis Date  . Arthritis   . Chicken pox   . Frequent headaches   . GERD (gastroesophageal reflux disease)   . Hypertension   . Stroke ([place])   . Syncope     Patient Active Problem List   Diagnosis Date Noted  . Orthostatic hypotension 01/09/2019  . Viral gastroenteritis 11/16/2018  . Tongue abnormality 09/15/2018  . Tobacco abuse 07/06/2017  . Essential hypertension 06/09/2017  . Fatigue 06/09/2017  . Snoring 06/09/2017    Past Surgical History:  Procedure Laterality Date  . TUBAL LIGATION      OB History   No obstetric history on file.      Home Medications    Prior to Admission medications   Medication Sig Start Date End Date Taking? Authorizing Provider  carvedilol (COREG) 25 MG tablet Take 1 tablet (25 mg total) by mouth 2 (two) times daily. 01/10/19 01/10/20 Yes Turner, Eber Hong, MD  losartan (COZAAR) 100 MG tablet Take 1 tablet (100 mg total) by mouth daily. 01/10/19 01/10/20 Yes Turner, Eber Hong, MD  spironolactone (ALDACTONE) 100 MG tablet Take 1  tablet (100 mg total) by mouth daily. Please keep upcoming appt in February with Dr. Radford Pax before anymore refills. Thank you 01/10/19 01/10/20 Yes Turner, Eber Hong, MD  benzonatate (TESSALON) 200 MG capsule Take 1 capsule (200 mg total) by mouth 3 (three) times daily as needed for up to 7 days for cough. 08/09/19 08/16/19  Jayvon Mounger C, PA-C  ibuprofen (ADVIL) 600 MG tablet Take 1 tablet (600 mg total) by mouth every 6 (six) hours as needed. 08/09/19   Brondon Wann C, PA-C  ondansetron (ZOFRAN ODT) 4 MG disintegrating tablet Take 1 tablet (4 mg total) by mouth every 8 (eight) hours as needed for nausea or vomiting. 08/09/19   Tajee Savant, Elesa Hacker, PA-C    Family History Family History  Problem Relation Age of Onset  . Hypertension Mother   . Hypertension Father   . Heart disease Father   . Breast cancer Maternal Grandmother   . Prostate cancer Paternal Grandfather     Social History Social History   Tobacco Use  . Smoking status: Current Every Day Smoker    Packs/day: 0.30    Years: 24.00    Pack years: 7.20    Types: Cigarettes  . Smokeless tobacco: Never Used  . Tobacco comment: about 8 cigarettes per day  Substance Use Topics  . Alcohol use: Yes    Comment:  occ.   . Drug use: No     Allergies   Sulfa drugs cross reactors   Review of Systems Review of Systems  Constitutional: Negative for activity change, appetite change, chills, fatigue and fever.  HENT: Positive for congestion and rhinorrhea. Negative for ear pain, sinus pressure, sore throat and trouble swallowing.   Eyes: Negative for discharge and redness.  Respiratory: Positive for cough. Negative for chest tightness and shortness of breath.   Cardiovascular: Negative for chest pain.  Gastrointestinal: Positive for nausea and vomiting. Negative for abdominal pain and diarrhea.  Musculoskeletal: Negative for myalgias.  Skin: Negative for rash.  Neurological: Negative for dizziness, light-headedness and headaches.      Physical Exam Triage Vital Signs ED Triage Vitals  Enc Vitals Group     BP 08/09/19 0944 (!) 142/99     Pulse --      Resp 08/09/19 0944 15     Temp 08/09/19 0944 98 F (36.7 C)     Temp Source 08/09/19 0944 Oral     SpO2 08/09/19 0944 98 %     Weight --      Height --      Head Circumference --      Peak Flow --      Pain Score 08/09/19 0942 7     Pain Loc --      Pain Edu? --      Excl. in [place]? --    No data found.  Updated Vital Signs BP (!) 142/99 (BP Location: Right Arm)   Temp 98 F (36.7 C) (Oral)   Resp 15   SpO2 98%   Visual Acuity Right Eye Distance:   Left Eye Distance:   Bilateral Distance:    Right Eye Near:   Left Eye Near:    Bilateral Near:     Physical Exam Vitals signs and nursing note reviewed.  Constitutional:      General: She is not in acute distress.    Appearance: She is well-developed.  HENT:     Head: Normocephalic and atraumatic.     Ears:     Comments: Bilateral ears without tenderness to palpation of external auricle, tragus and mastoid, EAC's without erythema or swelling, TM's with good bony landmarks and cone of light. Non erythematous.     Nose:     Comments: No rhinnorhea    Mouth/Throat:     Comments: Oral mucosa pink and moist, no tonsillar enlargement or exudate. Posterior pharynx patent and nonerythematous, no uvula deviation or swelling. Normal phonation. Eyes:     Conjunctiva/sclera: Conjunctivae normal.  Neck:     Musculoskeletal: Neck supple.  Cardiovascular:     Rate and Rhythm: Normal rate and regular rhythm.     Heart sounds: No murmur.  Pulmonary:     Effort: Pulmonary effort is normal. No respiratory distress.     Breath sounds: Normal breath sounds.     Comments: Breathing comfortably at rest, CTABL, no wheezing, rales or other adventitious sounds auscultated  Abdominal:     Palpations: Abdomen is soft.     Tenderness: There is no abdominal tenderness.     Comments: Soft, nondistended, nontender to  light and deep palpation throughout entire abdomen  Skin:    General: Skin is warm and dry.  Neurological:     Mental Status: She is alert.      UC Treatments / Results  Labs (all labs ordered are listed, but only abnormal results are displayed) Labs Reviewed  NOVEL CORONAVIRUS, NAA (HOSP ORDER, SEND-OUT TO REF LAB; TAT 18-24 HRS)    EKG   Radiology No results found.  Procedures Procedures (including critical care time)  Medications Ordered in UC Medications  ondansetron (ZOFRAN-ODT) disintegrating tablet 4 mg (4 mg Oral Given 08/09/19 1003)  ondansetron (ZOFRAN-ODT) 4 MG disintegrating tablet (has no administration in time range)    Initial Impression / Assessment and Plan / UC Course  I have reviewed the triage vital signs and the nursing notes.  Pertinent labs & imaging results that were available during my care of the patient were reviewed by me and considered in my medical decision making (see chart for details).     Patient with 2 days of nausea vomiting without abdominal pain currently, abdominal exam unremarkable.  Do not suspect underlying abdominal emergency at this time.  Likely viral, possibly related to viral URI or COVID.  Swab obtained.  Will treat symptomatically and supportively.  Recommendations below.  Push fluids.  Slowly transition diet.  Discussed strict return precautions. Patient verbalized understanding and is agreeable with plan.  Final Clinical Impressions(s) / UC Diagnoses   Final diagnoses:  Non-intractable vomiting with nausea, unspecified vomiting type  Viral URI with cough     Discharge Instructions      Covid swab pending. Zofran as needed for nausea and vomiting Push fluids, slowly transition diet to bland foods-rice, toast, crackers, soups Continue cetirizine/Zyrtec for congestion/drainage Tessalon for cough  Please follow-up if symptoms progressing, worsening, developing abdominal pain, fever, shortness of breath, difficulty  breathing     Person Under Monitoring Name: Debra Wade  Location: [address] [place] [address]   Infection Prevention Recommendations for Individuals Confirmed to have, or Being Evaluated for, 2019 Novel Coronavirus (COVID-19) Infection Who Receive Care at Home  Individuals who are confirmed to have, or are being evaluated for, COVID-19 should follow the prevention steps below until a healthcare provider or local or state health department says they can return to normal activities.  Stay home except to get medical care You should restrict activities outside your home, except for getting medical care. Do not go to work, school, or public areas, and do not use public transportation or taxis.  Call ahead before visiting your doctor Before your medical appointment, call the healthcare provider and tell them that you have, or are being evaluated for, COVID-19 infection. This will help the healthcare provider's office take steps to keep other people from getting infected. Ask your healthcare provider to call the local or state health department.  Monitor your symptoms Seek prompt medical attention if your illness is worsening (e.g., difficulty breathing). Before going to your medical appointment, call the healthcare provider and tell them that you have, or are being evaluated for, COVID-19 infection. Ask your healthcare provider to call the local or state health department.  Wear a facemask You should wear a facemask that covers your nose and mouth when you are in the same room with other people and when you visit a healthcare provider. People who live with or visit you should also wear a facemask while they are in the same room with you.  Separate yourself from other people in your home As much as possible, you should stay in a different room from other people in your home. Also, you should use a separate bathroom, if available.  Avoid sharing household items You  should not share dishes, drinking glasses, cups, eating utensils, towels, bedding, or other items with other people  in your home. After using these items, you should wash them thoroughly with soap and water.  Cover your coughs and sneezes Cover your mouth and nose with a tissue when you cough or sneeze, or you can cough or sneeze into your sleeve. Throw used tissues in a lined trash can, and immediately wash your hands with soap and water for at least 20 seconds or use an alcohol-based hand rub.  Wash your [facility] your hands often and thoroughly with soap and water for at least 20 seconds. You can use an alcohol-based hand sanitizer if soap and water are not available and if your hands are not visibly dirty. Avoid touching your eyes, nose, and mouth with unwashed hands.   Prevention Steps for Caregivers and Household Members of Individuals Confirmed to have, or Being Evaluated for, COVID-19 Infection Being Cared for in the Home  If you live with, or provide care at home for, a person confirmed to have, or being evaluated for, COVID-19 infection please follow these guidelines to prevent infection:  Follow healthcare provider's instructions Make sure that you understand and can help the patient follow any healthcare provider instructions for all care.  Provide for the patient's basic needs You should help the patient with basic needs in the home and provide support for getting groceries, prescriptions, and other personal needs.  Monitor the patient's symptoms If they are getting sicker, call his or her medical provider and tell them that the patient has, or is being evaluated for, COVID-19 infection. This will help the healthcare provider's office take steps to keep other people from getting infected. Ask the healthcare provider to call the local or state health department.  Limit the number of people who have contact with the patient  If possible, have only one caregiver for the  patient.  Other household members should stay in another home or place of residence. If this is not possible, they should stay  in another room, or be separated from the patient as much as possible. Use a separate bathroom, if available.  Restrict visitors who do not have an essential need to be in the home.  Keep older adults, very young children, and other sick people away from the patient Keep older adults, very young children, and those who have compromised immune systems or chronic health conditions away from the patient. This includes people with chronic heart, lung, or kidney conditions, diabetes, and cancer.  Ensure good ventilation Make sure that shared spaces in the home have good air flow, such as from an air conditioner or an opened window, weather permitting.  Wash your hands often  Wash your hands often and thoroughly with soap and water for at least 20 seconds. You can use an alcohol based hand sanitizer if soap and water are not available and if your hands are not visibly dirty.  Avoid touching your eyes, nose, and mouth with unwashed hands.  Use disposable paper towels to dry your hands. If not available, use dedicated cloth towels and replace them when they become wet.  Wear a facemask and gloves  Wear a disposable facemask at all times in the room and gloves when you touch or have contact with the patient's blood, body fluids, and/or secretions or excretions, such as sweat, saliva, sputum, nasal mucus, vomit, urine, or feces.  Ensure the mask fits over your nose and mouth tightly, and do not touch it during use.  Throw out disposable facemasks and gloves after using them. Do not reuse.  Wash your hands immediately after removing your facemask and gloves.  If your personal clothing becomes contaminated, carefully remove clothing and launder. Wash your hands after handling contaminated clothing.  Place all used disposable facemasks, gloves, and other waste in a lined  container before disposing them with other household waste.  Remove gloves and wash your hands immediately after handling these items.  Do not share dishes, glasses, or other household items with the patient  Avoid sharing household items. You should not share dishes, drinking glasses, cups, eating utensils, towels, bedding, or other items with a patient who is confirmed to have, or being evaluated for, COVID-19 infection.  After the person uses these items, you should wash them thoroughly with soap and water.  Wash laundry thoroughly  Immediately remove and wash clothes or bedding that have blood, body fluids, and/or secretions or excretions, such as sweat, saliva, sputum, nasal mucus, vomit, urine, or feces, on them.  Wear gloves when handling laundry from the patient.  Read and follow directions on labels of laundry or clothing items and detergent. In general, wash and dry with the warmest temperatures recommended on the label.  Clean all areas the individual has used often  Clean all touchable surfaces, such as counters, tabletops, doorknobs, bathroom fixtures, toilets, phones, keyboards, tablets, and bedside tables, every day. Also, clean any surfaces that may have blood, body fluids, and/or secretions or excretions on them.  Wear gloves when cleaning surfaces the patient has come in contact with.  Use a diluted bleach solution (e.g., dilute bleach with 1 part bleach and 10 parts water) or a household disinfectant with a label that says EPA-registered for coronaviruses. To make a bleach solution at home, add 1 tablespoon of bleach to 1 quart (4 cups) of water. For a larger supply, add  cup of bleach to 1 gallon (16 cups) of water.  Read labels of cleaning products and follow recommendations provided on product labels. Labels contain instructions for safe and effective use of the cleaning product including precautions you should take when applying the product, such as wearing gloves or  eye protection and making sure you have good ventilation during use of the product.  Remove gloves and wash hands immediately after cleaning.  Monitor yourself for signs and symptoms of illness Caregivers and household members are considered close contacts, should monitor their health, and will be asked to limit movement outside of the home to the extent possible. Follow the monitoring steps for close contacts listed on the symptom monitoring form.   ? If you have additional questions, contact your local health department or call the epidemiologist on call at [phone] (available 24/7). ? This guidance is subject to change. For the most up-to-date guidance from [facility], please refer to their website: [email or web address]     ED Prescriptions    Medication Sig Dispense Auth. Provider   ondansetron (ZOFRAN ODT) 4 MG disintegrating tablet Take 1 tablet (4 mg total) by mouth every 8 (eight) hours as needed for nausea or vomiting. 20 tablet Zelina Jimerson C, PA-C   ibuprofen (ADVIL) 600 MG tablet Take 1 tablet (600 mg total) by mouth every 6 (six) hours as needed. 30 tablet Madelon Welsch C, PA-C   benzonatate (TESSALON) 200 MG capsule Take 1 capsule (200 mg total) by mouth 3 (three) times daily as needed for up to 7 days for cough. 28 capsule Ari Engelbrecht C, PA-C     Controlled Substance Prescriptions [place] Controlled Substance Registry consulted? Not Applicable   Joneen Caraway, [place]  C, PA-C 08/09/19 1028

## 2019-08-09 NOTE — Discharge Instructions (Signed)
Covid swab pending. Zofran as needed for nausea and vomiting Push fluids, slowly transition diet to bland foods-rice, toast, crackers, soups Continue cetirizine/Zyrtec for congestion/drainage Tessalon for cough  Please follow-up if symptoms progressing, worsening, developing abdominal pain, fever, shortness of breath, difficulty breathing     Person Under Monitoring Name: Debra Wade  Location: [address] [place] [address]   Infection Prevention Recommendations for Individuals Confirmed to have, or Being Evaluated for, 2019 Novel Coronavirus (COVID-19) Infection Who Receive Care at Home  Individuals who are confirmed to have, or are being evaluated for, COVID-19 should follow the prevention steps below until a healthcare provider or local or state health department says they can return to normal activities.  Stay home except to get medical care You should restrict activities outside your home, except for getting medical care. Do not go to work, school, or public areas, and do not use public transportation or taxis.  Call ahead before visiting your doctor Before your medical appointment, call the healthcare provider and tell them that you have, or are being evaluated for, COVID-19 infection. This will help the healthcare providers office take steps to keep other people from getting infected. Ask your healthcare provider to call the local or state health department.  Monitor your symptoms Seek prompt medical attention if your illness is worsening (e.g., difficulty breathing). Before going to your medical appointment, call the healthcare provider and tell them that you have, or are being evaluated for, COVID-19 infection. Ask your healthcare provider to call the local or state health department.  Wear a facemask You should wear a facemask that covers your nose and mouth when you are in the same room with other people and when you visit a healthcare provider. People  who live with or visit you should also wear a facemask while they are in the same room with you.  Separate yourself from other people in your home As much as possible, you should stay in a different room from other people in your home. Also, you should use a separate bathroom, if available.  Avoid sharing household items You should not share dishes, drinking glasses, cups, eating utensils, towels, bedding, or other items with other people in your home. After using these items, you should wash them thoroughly with soap and water.  Cover your coughs and sneezes Cover your mouth and nose with a tissue when you cough or sneeze, or you can cough or sneeze into your sleeve. Throw used tissues in a lined trash can, and immediately wash your hands with soap and water for at least 20 seconds or use an alcohol-based hand rub.  Wash your [facility] your hands often and thoroughly with soap and water for at least 20 seconds. You can use an alcohol-based hand sanitizer if soap and water are not available and if your hands are not visibly dirty. Avoid touching your eyes, nose, and mouth with unwashed hands.   Prevention Steps for Caregivers and Household Members of Individuals Confirmed to have, or Being Evaluated for, COVID-19 Infection Being Cared for in the Home  If you live with, or provide care at home for, a person confirmed to have, or being evaluated for, COVID-19 infection please follow these guidelines to prevent infection:  Follow healthcare providers instructions Make sure that you understand and can help the patient follow any healthcare provider instructions for all care.  Provide for the patients basic needs You should help the patient with basic needs in the home and provide  support for getting groceries, prescriptions, and other personal needs.  Monitor the patients symptoms If they are getting sicker, call his or her medical provider and tell them that the patient has, or  is being evaluated for, COVID-19 infection. This will help the healthcare providers office take steps to keep other people from getting infected. Ask the healthcare provider to call the local or state health department.  Limit the number of people who have contact with the patient If possible, have only one caregiver for the patient. Other household members should stay in another home or place of residence. If this is not possible, they should stay in another room, or be separated from the patient as much as possible. Use a separate bathroom, if available. Restrict visitors who do not have an essential need to be in the home.  Keep older adults, very young children, and other sick people away from the patient Keep older adults, very young children, and those who have compromised immune systems or chronic health conditions away from the patient. This includes people with chronic heart, lung, or kidney conditions, diabetes, and cancer.  Ensure good ventilation Make sure that shared spaces in the home have good air flow, such as from an air conditioner or an opened window, weather permitting.  Wash your hands often Wash your hands often and thoroughly with soap and water for at least 20 seconds. You can use an alcohol based hand sanitizer if soap and water are not available and if your hands are not visibly dirty. Avoid touching your eyes, nose, and mouth with unwashed hands. Use disposable paper towels to dry your hands. If not available, use dedicated cloth towels and replace them when they become wet.  Wear a facemask and gloves Wear a disposable facemask at all times in the room and gloves when you touch or have contact with the patients blood, body fluids, and/or secretions or excretions, such as sweat, saliva, sputum, nasal mucus, vomit, urine, or feces.  Ensure the mask fits over your nose and mouth tightly, and do not touch it during use. Throw out disposable facemasks and gloves after  using them. Do not reuse. Wash your hands immediately after removing your facemask and gloves. If your personal clothing becomes contaminated, carefully remove clothing and launder. Wash your hands after handling contaminated clothing. Place all used disposable facemasks, gloves, and other waste in a lined container before disposing them with other household waste. Remove gloves and wash your hands immediately after handling these items.  Do not share dishes, glasses, or other household items with the patient Avoid sharing household items. You should not share dishes, drinking glasses, cups, eating utensils, towels, bedding, or other items with a patient who is confirmed to have, or being evaluated for, COVID-19 infection. After the person uses these items, you should wash them thoroughly with soap and water.  Wash laundry thoroughly Immediately remove and wash clothes or bedding that have blood, body fluids, and/or secretions or excretions, such as sweat, saliva, sputum, nasal mucus, vomit, urine, or feces, on them. Wear gloves when handling laundry from the patient. Read and follow directions on labels of laundry or clothing items and detergent. In general, wash and dry with the warmest temperatures recommended on the label.  Clean all areas the individual has used often Clean all touchable surfaces, such as counters, tabletops, doorknobs, bathroom fixtures, toilets, phones, keyboards, tablets, and bedside tables, every day. Also, clean any surfaces that may have blood, body fluids, and/or secretions or excretions  on them. Wear gloves when cleaning surfaces the patient has come in contact with. Use a diluted bleach solution (e.g., dilute bleach with 1 part bleach and 10 parts water) or a household disinfectant with a label that says EPA-registered for coronaviruses. To make a bleach solution at home, add 1 tablespoon of bleach to 1 quart (4 cups) of water. For a larger supply, add  cup of bleach  to 1 gallon (16 cups) of water. Read labels of cleaning products and follow recommendations provided on product labels. Labels contain instructions for safe and effective use of the cleaning product including precautions you should take when applying the product, such as wearing gloves or eye protection and making sure you have good ventilation during use of the product. Remove gloves and wash hands immediately after cleaning.  Monitor yourself for signs and symptoms of illness Caregivers and household members are considered close contacts, should monitor their health, and will be asked to limit movement outside of the home to the extent possible. Follow the monitoring steps for close contacts listed on the symptom monitoring form.   ? If you have additional questions, contact your local health department or call the epidemiologist on call at [phone] (available 24/7). ? This guidance is subject to change. For the most up-to-date guidance from [facility], please refer to their website: [email or web address]

## 2019-08-09 NOTE — ED Triage Notes (Signed)
Patient presents to Urgent Care with complaints of vomiting since 2 days ago. Patient reports she has already thrown up twice today, had approx 8 episodes yesterday. Pt has not been COVID tested in several months.

## 2019-08-10 LAB — NOVEL CORONAVIRUS, NAA (HOSP ORDER, SEND-OUT TO REF LAB; TAT 18-24 HRS): SARS-CoV-2, NAA: NOT DETECTED

## 2019-08-12 ENCOUNTER — Ambulatory Visit (INDEPENDENT_AMBULATORY_CARE_PROVIDER_SITE_OTHER): Payer: 59 | Admitting: Family

## 2019-08-12 ENCOUNTER — Encounter: Payer: Self-pay | Admitting: Family

## 2019-08-12 VITALS — Wt 186.0 lb

## 2019-08-12 DIAGNOSIS — K219 Gastro-esophageal reflux disease without esophagitis: Secondary | ICD-10-CM | POA: Diagnosis not present

## 2019-08-12 DIAGNOSIS — I10 Essential (primary) hypertension: Secondary | ICD-10-CM

## 2019-08-12 DIAGNOSIS — R1084 Generalized abdominal pain: Secondary | ICD-10-CM | POA: Diagnosis not present

## 2019-08-12 MED ORDER — PANTOPRAZOLE SODIUM 40 MG PO TBEC: 40.0000 mg | DELAYED_RELEASE_TABLET | Freq: Every day | ORAL | 3 refills | Status: DC

## 2019-08-12 NOTE — Progress Notes (Signed)
Debra Wade is a 48 y.o. female with the following history as recorded in Epic[facility]:  Patient Active Problem List   Diagnosis Date Noted  . Orthostatic hypotension 01/09/2019  . Viral gastroenteritis 11/16/2018  . Tongue abnormality 09/15/2018  . Tobacco abuse 07/06/2017  . Essential hypertension 06/09/2017  . Fatigue 06/09/2017  . Snoring 06/09/2017    Current Outpatient Medications  Medication Sig Dispense Refill  . benzonatate (TESSALON) 200 MG capsule Take 1 capsule (200 mg total) by mouth 3 (three) times daily as needed for up to 7 days for cough. 28 capsule 0  . carvedilol (COREG) 25 MG tablet Take 1 tablet (25 mg total) by mouth 2 (two) times daily. 180 tablet 3  . ibuprofen (ADVIL) 600 MG tablet Take 1 tablet (600 mg total) by mouth every 6 (six) hours as needed. 30 tablet 0  . ondansetron (ZOFRAN ODT) 4 MG disintegrating tablet Take 1 tablet (4 mg total) by mouth every 8 (eight) hours as needed for nausea or vomiting. 20 tablet 0  . pantoprazole (PROTONIX) 40 MG tablet Take 1 tablet (40 mg total) by mouth daily. 30 tablet 3  . spironolactone (ALDACTONE) 100 MG tablet Take 1 tablet (100 mg total) by mouth daily. Please keep upcoming appt in February with Dr. Radford Wade before anymore refills. Thank you 90 tablet 3   No current facility-administered medications for this visit.     Allergies: Sulfa drugs cross reactors  Past Medical History:  Diagnosis Date  . Arthritis   . Chicken pox   . Frequent headaches   . GERD (gastroesophageal reflux disease)   . Hypertension   . Stroke ([place])   . Syncope     Past Surgical History:  Procedure Laterality Date  . TUBAL LIGATION      Family History  Problem Relation Age of Onset  . Hypertension Mother   . Hypertension Father   . Heart disease Father   . Breast cancer Maternal Grandmother   . Prostate cancer Paternal Grandfather     Social History   Tobacco Use  . Smoking status: Current Every Day Smoker    Packs/day: 0.30    Years: 24.00    Pack years: 7.20    Types: Cigarettes  . Smokeless tobacco: Never Used  . Tobacco comment: about 8 cigarettes per day  Substance Use Topics  . Alcohol use: Yes    Comment: occ.     Subjective:    I connected with Debra Wade on 08/12/19 at 10:00 AM EDT by a video enabled telemedicine application and verified that I am speaking with the correct person using two identifiers. Patient and I are the only 2 people on the video call.    I discussed the limitations of evaluation and management by telemedicine and the availability of in person appointments. The patient expressed understanding and agreed to proceed.  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  General: Well developed, well nourished, in no acute distress  Lungs: Respirations unlabored;  Neurologic: Alert and oriented; speech intact; face symmetrical;  Assessment:  1. Generalized abdominal pain   2. Gastroesophageal reflux disease, esophagitis presence not specified   3. Essential hypertension     Plan:  1. Update abdominal ultrasound; trial of Protonix 40 mg daily; follow-up to be determined; 2. ? Control- not able to take medication as prescribed; keep planned follow-up with cardiology- encouraged to discuss alternative medications if possible as having difficulty with bid dosing.   No follow-ups on file.  Orders Placed This Encounter  Procedures  . US Abdomen Complete    Standing Status:   Future    Standing Expiration Date:   10/11/2020    Order Specific Question:   Reason for  Exam (SYMPTOM  OR DIAGNOSIS REQUIRED)    Answer:   abdominal pain    Order Specific Question:   Preferred imaging location?    Answer:   [facility]    Requested Prescriptions   Signed Prescriptions Disp Refills  . pantoprazole (PROTONIX) 40 MG tablet 30 tablet 3    Sig: Take 1 tablet (40 mg total) by mouth daily.

## 2019-08-18 ENCOUNTER — Ambulatory Visit: Payer: 59 | Admitting: Physician Assistant

## 2019-08-26 ENCOUNTER — Other Ambulatory Visit: Payer: 59

## 2019-08-30 NOTE — Progress Notes (Signed)
Cardiology Office Note    Date:  09/02/2019   ID:  Debra Wade, DOB July 01, 1971, MRN [id number]  PCP:  Lance Sell, NP  Cardiologist:  Fransico Him, MD  Electrophysiologist:  None   Chief Complaint: f/u BP  History of Present Illness:   Debra Wade is a 48 y.o. female with history of headache, HTN, GERD, CVA who has followed with Dr. Radford Pax in the past for HTN. Per Dr. Theodosia Blender notes, she has not tolerated amlodipine due to LE edema and had syncope/orthostatic hypotension after taking metoprolol and Losartan HCT. She also has a history of preeclampsia. She has since done well on carvedilol and spironolactone. At last OV 01/2019 she had self discontinued loartan and was taking carvedilol only once a day. She was asked to remain on carvedilol 25mg  daily and spironolactone 100mg  daily. Last labs 01/2019 showed K 4.0, Cr 0.98, 2018 TSH wnl, LFTs wnl, CBC wnl. 2D echo 2018 showed EF 60-65%, grade 1 DD but no clinical history of CHF.  She returns for follow-up overall feeling well. She has toyed around with her carvedilol dose, checking various measurements, but is now back on 25mg  BID. She admits that whenever she sees SBPs in the 110-120 range, she gets very concerned that it is too low and feels anxious/jittery. She actually feels better with higher BP. It is well controlled on regimen below. She does admit to generalized fatigue and snoring. Sometimes she works 16 hours a day at [employer] and has 5 children so definitely as a lot of stressors in her life and does not always take time to care for herself. No symptoms of HF, angina, dizziness, edema, orthopnea.  Past Medical History:  Diagnosis Date  . Arthritis   . Chicken pox   . Frequent headaches   . GERD (gastroesophageal reflux disease)   . Hypertension   . Stroke ([place])   . Syncope     Past Surgical History:  Procedure Laterality Date  . TUBAL LIGATION      Current Medications: Current Meds  Medication Sig  .  carvedilol (COREG) 25 MG tablet Take 1 tablet (25 mg total) by mouth 2 (two) times daily.  Marland Kitchen ibuprofen (ADVIL) 600 MG tablet Take 1 tablet (600 mg total) by mouth every 6 (six) hours as needed.  . ondansetron (ZOFRAN ODT) 4 MG disintegrating tablet Take 1 tablet (4 mg total) by mouth every 8 (eight) hours as needed for nausea or vomiting.  Marland Kitchen spironolactone (ALDACTONE) 100 MG tablet Take 1 tablet (100 mg total) by mouth daily. Please keep upcoming appt in February with Dr. Radford Pax before anymore refills. Thank you     Allergies:   Sulfa drugs cross reactors   Social History   Socioeconomic History  . Marital status: Single    Spouse name: Not on file  . Number of children: 5  . Years of education: 41  . Highest education level: Not on file  Occupational History  . Occupation: Med [employer]  . Financial resource strain: Not on file  . Food insecurity    Worry: Not on file    Inability: Not on file  . Transportation needs    Medical: Not on file    Non-medical: Not on file  Tobacco Use  . Smoking status: Current Every Day Smoker    Packs/day: 0.30    Years: 24.00    Pack years: 7.20    Types: Cigarettes  . Smokeless tobacco: Never Used  .  Tobacco comment: about 8 cigarettes per day  Substance and Sexual Activity  . Alcohol use: Yes    Comment: occ.   . Drug use: No  . Sexual activity: Not on file  Lifestyle  . Physical activity    Days per week: Not on file    Minutes per session: Not on file  . Stress: Not on file  Relationships  . Social Herbalist on phone: Not on file    Gets together: Not on file    Attends religious service: Not on file    Active member of club or organization: Not on file    Attends meetings of clubs or organizations: Not on file    Relationship status: Not on file  Other Topics Concern  . Not on file  Social History Narrative   Fun/Hobby: [place], anything relaxing   Denies abuse and feels safe at home.     Family  History:  The patient's family history includes Breast cancer in her maternal grandmother; Heart disease in her father; Hypertension in her father and mother; Prostate cancer in her paternal grandfather.  ROS:   Please see the history of present illness.  All other systems are reviewed and otherwise negative.    EKGs/Labs/Other Studies Reviewed:    Studies reviewed were summarized above.   EKG:  EKG is ordered today, personally reviewed, demonstrating NSR 81bpm, nonspecific TWI III, otherwise normal. No change  Recent Labs: 01/10/2019: BUN 8; Creatinine, Ser 0.98; Potassium 4.0; Sodium 140  Recent Lipid Panel    Component Value Date/Time   CHOL 207 (H) 07/13/2008 2018   TRIG 76 07/13/2008 2018   HDL 52 07/13/2008 2018   CHOLHDL 4.0 Ratio 07/13/2008 2018   VLDL 15 07/13/2008 2018   LDLCALC 140 (H) 07/13/2008 2018    PHYSICAL EXAM:    VS:  BP 122/80   Pulse 81   Ht 5\' 9"  (1.753 m)   Wt 188 lb (85.3 kg)   SpO2 96%   BMI 27.76 kg/m   BMI: Body mass index is 27.76 kg/m.  GEN: Well nourished, well developed AAF, in no acute distress HEENT: normocephalic, atraumatic Neck: no JVD, carotid bruits, or masses Cardiac: RRR; no murmurs, rubs, or gallops, no edema  Respiratory:  clear to auscultation bilaterally, normal work of breathing GI: soft, nontender, nondistended, + BS MS: no deformity or atrophy Skin: warm and dry, no rash Neuro:  Alert and Oriented x 3, Strength and sensation are intact, follows commands Psych: euthymic mood, full affect  Wt Readings from Last 3 Encounters:  09/02/19 188 lb (85.3 kg)  08/12/19 186 lb (84.4 kg)  01/10/19 184 lb 9.6 oz (83.7 kg)     ASSESSMENT & PLAN:   1. Essential HTN - well controlled on current regimen. I reassured her that when her BP is in the 110-120 range this is actually very normal and within goal. She was surprised to hear this so we went over the most recent guidelines regarding HTN. Interestingly she reports she feels  better at higher BPs but I wonder if that's perhaps the effect of carvedilol itself. It could also be lifestyle driven. She has symptoms concerning for sleep apnea with snoring/daytime fatigue so will obtain sleep study. If unremarkable for OSA, will consider adjusting her medication regimen to see if we can help her feel her best. In anticipation of this will update thyroid and BMET. I also encouraged self-care and taking time for herself. 2. H/o orthostatic hypotension -  no recent issues with symptomatic recurrences. 3. Suspected sleep apnea/snoring - will obtain sleep study as above.   Disposition: F/u with me virtually after sleep study.  Medication Adjustments/Labs and Tests Ordered: Current medicines are reviewed at length with the patient today.  Concerns regarding medicines are outlined above. Medication changes, Labs and Tests ordered today are summarized above and listed in the Patient Instructions accessible in Encounters.   Signed, Charlie Pitter, PA-C  09/02/2019 10:52 AM    [facility] [place], [place], [place]  [address] Phone: [phone]; Fax: [phone]

## 2019-09-02 ENCOUNTER — Other Ambulatory Visit: Payer: Self-pay | Admitting: Family

## 2019-09-02 ENCOUNTER — Other Ambulatory Visit: Payer: Self-pay

## 2019-09-02 ENCOUNTER — Encounter: Payer: Self-pay | Admitting: Physician Assistant

## 2019-09-02 ENCOUNTER — Ambulatory Visit
Admission: RE | Admit: 2019-09-02 | Discharge: 2019-09-02 | Disposition: A | Discharge status: Home / Self Care | Payer: 59 | Source: Ambulatory Visit | Attending: Family | Admitting: Family

## 2019-09-02 ENCOUNTER — Ambulatory Visit (INDEPENDENT_AMBULATORY_CARE_PROVIDER_SITE_OTHER): Payer: 59 | Admitting: Physician Assistant

## 2019-09-02 VITALS — BP 122/80 | HR 81 | Ht 69.0 in | Wt 188.0 lb

## 2019-09-02 DIAGNOSIS — R29818 Other symptoms and signs involving the nervous system: Secondary | ICD-10-CM

## 2019-09-02 DIAGNOSIS — N281 Cyst of kidney, acquired: Secondary | ICD-10-CM

## 2019-09-02 DIAGNOSIS — R1084 Generalized abdominal pain: Secondary | ICD-10-CM

## 2019-09-02 DIAGNOSIS — I10 Essential (primary) hypertension: Secondary | ICD-10-CM | POA: Diagnosis not present

## 2019-09-02 DIAGNOSIS — R0683 Snoring: Secondary | ICD-10-CM | POA: Diagnosis not present

## 2019-09-02 DIAGNOSIS — I951 Orthostatic hypotension: Secondary | ICD-10-CM | POA: Diagnosis not present

## 2019-09-02 LAB — TSH: TSH: 1.6 u[IU]/mL (ref 0.450–4.500)

## 2019-09-02 LAB — T4, FREE: Free T4: 1.03 ng/dL (ref 0.82–1.77)

## 2019-09-02 LAB — BASIC METABOLIC PANEL
BUN/Creatinine Ratio: 17 (ref 9–23)
BUN: 19 mg/dL (ref 6–24)
CO2: 21 mmol/L (ref 20–29)
Calcium: 9.9 mg/dL (ref 8.7–10.2)
Chloride: 102 mmol/L (ref 96–106)
Creatinine, Ser: 1.13 mg/dL — ABNORMAL HIGH (ref 0.57–1.00)
GFR calc Af Amer: 67 mL/min/{1.73_m2} (ref >59)
GFR calc non Af Amer: 58 mL/min/{1.73_m2} — ABNORMAL LOW (ref >59)
Glucose: 107 mg/dL — ABNORMAL HIGH (ref 65–99)
Potassium: 4.6 mmol/L (ref 3.5–5.2)
Sodium: 138 mmol/L (ref 134–144)

## 2019-09-02 NOTE — Patient Instructions (Signed)
Medication Instructions:  Your physician recommends that you continue on your current medications as directed. Please refer to the Current Medication list given to you today.  If you need a refill on your cardiac medications before your next appointment, please call your pharmacy.   Lab work: TDOAY:  BMET, TSH, & FREE T4  If you have labs (blood work) drawn today and your tests are completely normal, you will receive your results only by: Marland Kitchen MyChart Message (if you have MyChart) OR . A paper copy in the mail If you have any lab test that is abnormal or we need to change your treatment, we will call you to review the results.  Testing/Procedures: Your physician has recommended that you have a sleep study. This test records several body functions during sleep, including: brain activity, eye movement, oxygen and carbon dioxide blood levels, heart rate and rhythm, breathing rate and rhythm, the flow of air through your mouth and nose, snoring, body muscle movements, and chest and belly movement.    Follow-Up: At [facility], you and your health needs are our priority.  As part of our continuing mission to provide you with exceptional heart care, we have created designated Provider Care Teams.  These Care Teams include your primary Cardiologist (physician) and Advanced Practice Providers (APPs -  Physician Assistants and Nurse Practitioners) who all work together to provide you with the care you need, when you need it. . We will call you to arrange a VIRTUAL follow-up appointment with Melina Copa, PA-C after the sleep study has been scheduled.  Any Other Special Instructions Will Be Listed Below (If Applicable).  Sleep Studies A sleep study (polysomnogram) is a series of tests done while you are sleeping. A sleep study records your brain waves, heart rate, breathing rate, oxygen level, and eye and leg movements. A sleep study helps your health care provider:  See how well you sleep.   Diagnose a sleep disorder.  Determine how severe your sleep disorder is.  Create a plan to treat your sleep disorder. Your health care provider may recommend a sleep study if you:  Feel sleepy on most days.  Snore loudly while sleeping.  Have unusual behaviors while you sleep, such as walking.  Have brief periods in which you stop breathing during sleep (sleepapnea).  Fall asleep suddenly during the day (narcolepsy).  Have trouble falling asleep or staying asleep (insomnia).  Feel like you need to move your legs when trying to fall asleep (restless legs syndrome).  Move your legs by flexing and extending them regularly while asleep (periodic limb movement disorder).  Act out your dreams while you sleep (sleep behavior disorder).  Feel like you cannot move when you first wake up (sleep paralysis). What tests are part of a sleep study? Most sleep studies record the following during sleep:  Brain activity.  Eye movements.  Heart rate and rhythm.  Breathing rate and rhythm.  Blood-oxygen level.  Blood pressure.  Chest and belly movement as you breathe.  Arm and leg movements.  Snoring or other noises.  Body position. Where are sleep studies done? Sleep studies are done at sleep centers. A sleep center may be inside a hospital, office, or clinic. The room where you have the study may look like a hospital room or a hotel room. The health care providers doing the study may come in and out of the room during the study. Most of the time, they will be in another room monitoring your test as  you sleep. How are sleep studies done? Most sleep studies are done during a normal period of time for a full night of sleep. You will arrive at the study center in the evening and go home in the morning. Before the test  Bring your pajamas and toothbrush with you to the sleep study.  Do not have caffeine on the day of your sleep study.  Do not drink alcohol on the day of your sleep  study.  Your health care provider will let you know if you should stop taking any of your regular medicines before the test. During the test      Round, sticky patches with sensors attached to recording wires (electrodes) are placed on your scalp, face, chest, and limbs.  Wires from all the electrodes and sensors run from your bed to a computer. The wires can be taken off and put back on if you need to get out of bed to go to the bathroom.  A sensor is placed over your nose to measure airflow.  A finger clip is put on your finger or ear to measure your blood oxygen level (pulse oximetry).  A belt is placed around your belly and a belt is placed around your chest to measure breathing movements.  If you have signs of the sleep disorder called sleep apnea during your test, you may get a treatment mask to wear for the second half of the night. ? The mask provides positive airway pressure (PAP) to help you breathe better during sleep. This may greatly improve your sleep apnea. ? You will then have all tests done again with the mask in place to see if your measurements and recordings change. After the test  A medical doctor who specializes in sleep will evaluate the results of your sleep study and share them with you and your primary health care provider.  Based on your results, your medical history, and a physical exam, you may be diagnosed with a sleep disorder, such as: ? Sleep apnea. ? Restless legs syndrome. ? Sleep-related behavior disorder. ? Sleep-related movement disorders. ? Sleep-related seizure disorders.  Your health care team will help determine your treatment options based on your diagnosis. This may include: ? Improving your sleep habits (sleep hygiene). ? Wearing a continuous positive airway pressure (CPAP) or bi-level positive airway pressure (BPAP) mask. ? Wearing an oral device at night to improve breathing and reduce snoring. ? Taking medicines. Follow these  instructions at home:  Take over-the-counter and prescription medicines only as told by your health care provider.  If you are instructed to use a CPAP or BPAP mask, make sure you use it nightly as directed.  Make any lifestyle changes that your health care provider recommends.  If you were given a device to open your airway while you sleep, use it only as told by your health care provider.  Do not use any tobacco products, such as cigarettes, chewing tobacco, and e-cigarettes. If you need help quitting, ask your health care provider.  Keep all follow-up visits as told by your health care provider. This is important. Summary  A sleep study (polysomnogram) is a series of tests done while you are sleeping. It shows how well you sleep.  Most sleep studies are done over one full night of sleep. You will arrive at the study center in the evening and go home in the morning.  If you have signs of the sleep disorder called sleep apnea during your test, you may get  a treatment mask to wear for the second half of the night.  A medical doctor who specializes in sleep will evaluate the results of your sleep study and share them with your primary health care provider. This information is not intended to replace advice given to you by your health care provider. Make sure you discuss any questions you have with your health care provider. Document Released: 05/31/2003 Document Revised: 09/10/2018 Document Reviewed: 12/22/2017 Elsevier Patient Education  2020 [employer].

## 2019-09-05 ENCOUNTER — Telehealth: Payer: Self-pay | Admitting: *Deleted

## 2019-09-05 DIAGNOSIS — Z79899 Other long term (current) drug therapy: Secondary | ICD-10-CM

## 2019-09-05 NOTE — Telephone Encounter (Signed)
-----   Message from Charlie Pitter, [place] sent at 09/03/2019  1:01 PM EDT ----- Addendum: would also advise to limit NSAIDS

## 2019-09-12 ENCOUNTER — Other Ambulatory Visit: Payer: 59 | Admitting: *Deleted

## 2019-09-12 ENCOUNTER — Other Ambulatory Visit: Payer: Self-pay

## 2019-09-12 DIAGNOSIS — Z79899 Other long term (current) drug therapy: Secondary | ICD-10-CM

## 2019-09-12 LAB — BASIC METABOLIC PANEL
BUN/Creatinine Ratio: 14 (ref 9–23)
BUN: 18 mg/dL (ref 6–24)
CO2: 24 mmol/L (ref 20–29)
Calcium: 9.7 mg/dL (ref 8.7–10.2)
Chloride: 103 mmol/L (ref 96–106)
Creatinine, Ser: 1.25 mg/dL — ABNORMAL HIGH (ref 0.57–1.00)
GFR calc Af Amer: 60 mL/min/{1.73_m2} (ref >59)
GFR calc non Af Amer: 52 mL/min/{1.73_m2} — ABNORMAL LOW (ref >59)
Glucose: 102 mg/dL — ABNORMAL HIGH (ref 65–99)
Potassium: 4.4 mmol/L (ref 3.5–5.2)
Sodium: 139 mmol/L (ref 134–144)

## 2019-09-13 ENCOUNTER — Telehealth: Payer: Self-pay | Admitting: *Deleted

## 2019-09-13 DIAGNOSIS — Z79899 Other long term (current) drug therapy: Secondary | ICD-10-CM

## 2019-09-13 MED ORDER — SPIRONOLACTONE 100 MG PO TABS: 50.0000 mg | ORAL_TABLET | Freq: Every day | ORAL | 3 refills | Status: DC

## 2019-09-13 NOTE — Telephone Encounter (Signed)
-----   Message from Charlie Pitter, [place] sent at 09/13/2019  8:25 AM EDT ----- Please let pt know kidney function remains a little abnormal. At this point I would suggest we decrease spironolactone to 50mg  daily and recheck BMET 1 week after. She should monitor her BP at home and call us/send in MyChart message if BP begins running greater than 130 on the top number.  I see where internal med had recommended kidney referral given her cysts on abd ultrasound but their CMA note says "Spoke with patient and and info given. She has follow up with Cardiology next week and will hold off for kidney referral until after she sees them." Her follow-up was to be done after her sleep study so does not appear currently scheduled with our office. I would recommend she go ahead with the kidney referral as advised by IM. Dayna Dunn PA-C

## 2019-09-19 ENCOUNTER — Telehealth: Payer: Self-pay | Admitting: *Deleted

## 2019-09-19 DIAGNOSIS — R29818 Other symptoms and signs involving the nervous system: Secondary | ICD-10-CM

## 2019-09-19 NOTE — Telephone Encounter (Signed)
Staff message sent to Gae Bon, in lab sleep study denied by [facility]. Ok to order HST. No PA required. Notify ordering provider.

## 2019-09-19 NOTE — Telephone Encounter (Signed)
-----   Message from Charlie Pitter, [place] sent at 09/19/2019 10:27 AM EDT ----- Regarding: RE: sleep study yes ----- Message ----- From: Freada Bergeron, CMA Sent: 09/19/2019   8:58 AM EDT To: Charlie Pitter, PA-C, Jeanann Lewandowsky, RMA Subject: FW: sleep study                                Is home sleep study ok? ----- Message ----- From: Lauralee Evener, CMA Sent: 09/19/2019   8:41 AM EDT To: Freada Bergeron, CMA Subject: RE: sleep study                                In lab sleep study denied by [facility]. Ok to do [employer] No PA required. Please notify Dayna. ----- Message ----- From: Jeanann Lewandowsky, RMA Sent: 09/02/2019  10:47 AM EDT To: [facility] Sleep Studies Subject: sleep study                                    Pt needs a sleep study. Please let me know when it has been scheduled so I can call pt and arrange a VIRTUAL f/u appointment.  Thanks!

## 2019-09-20 ENCOUNTER — Other Ambulatory Visit: Payer: 59

## 2019-09-20 ENCOUNTER — Other Ambulatory Visit: Payer: Self-pay

## 2019-09-20 DIAGNOSIS — Z79899 Other long term (current) drug therapy: Secondary | ICD-10-CM

## 2019-09-20 LAB — BASIC METABOLIC PANEL
BUN/Creatinine Ratio: 14 (ref 9–23)
BUN: 16 mg/dL (ref 6–24)
CO2: 24 mmol/L (ref 20–29)
Calcium: 9.4 mg/dL (ref 8.7–10.2)
Chloride: 102 mmol/L (ref 96–106)
Creatinine, Ser: 1.12 mg/dL — ABNORMAL HIGH (ref 0.57–1.00)
GFR calc Af Amer: 68 mL/min/{1.73_m2} (ref >59)
GFR calc non Af Amer: 59 mL/min/{1.73_m2} — ABNORMAL LOW (ref >59)
Glucose: 94 mg/dL (ref 65–99)
Potassium: 4.5 mmol/L (ref 3.5–5.2)
Sodium: 139 mmol/L (ref 134–144)

## 2019-09-26 NOTE — Telephone Encounter (Signed)
Patient is aware and agreeable to Home Sleep Study through [facility]. Patient is scheduled for 11/14/19 at 9 am  to pick up home sleep kit and meet with Respiratory therapist at [facility]. Patient is aware that if this appointment date and time does not work for them they should contact Artis Delay directly at [phone]. Patient is aware that a sleep packet will be sent from [facility] in week. Patient is agreeable to treatment and thankful for call.

## 2019-10-03 ENCOUNTER — Telehealth: Payer: Self-pay | Admitting: Physician Assistant

## 2019-10-03 NOTE — Telephone Encounter (Signed)
I received sleep study denial letter from pt's health insurance indicating her sleep study was denied, listed under diagnosis code of R29.818 for other symptoms and signs involving the nervous system. I do not see we used that code. Can we see if we can resubmit code under R40.0 (daytime sleepiness), snoring (R06.83), and essential HTN (I10)? It does appear we got a message to change to home sleep study so if this does not need to be resubmitted, disregard this message. Elian Gloster PA-C

## 2019-11-14 ENCOUNTER — Ambulatory Visit (HOSPITAL_BASED_OUTPATIENT_CLINIC_OR_DEPARTMENT_OTHER): Payer: 59 | Admitting: Cardiology

## 2019-11-28 ENCOUNTER — Encounter (HOSPITAL_COMMUNITY): Payer: Self-pay | Admitting: Emergency Medicine

## 2019-11-28 ENCOUNTER — Other Ambulatory Visit: Payer: Self-pay

## 2019-11-28 ENCOUNTER — Emergency Department (HOSPITAL_COMMUNITY): Payer: 59

## 2019-11-28 ENCOUNTER — Emergency Department (HOSPITAL_COMMUNITY)
Admission: EM | Admit: 2019-11-28 | Discharge: 2019-11-28 | Disposition: A | Discharge status: Home / Self Care | Payer: 59 | Attending: Emergency Medicine | Admitting: Emergency Medicine

## 2019-11-28 DIAGNOSIS — R109 Unspecified abdominal pain: Secondary | ICD-10-CM | POA: Insufficient documentation

## 2019-11-28 DIAGNOSIS — F17210 Nicotine dependence, cigarettes, uncomplicated: Secondary | ICD-10-CM | POA: Diagnosis not present

## 2019-11-28 DIAGNOSIS — I10 Essential (primary) hypertension: Secondary | ICD-10-CM | POA: Insufficient documentation

## 2019-11-28 DIAGNOSIS — M545 Low back pain, unspecified: Secondary | ICD-10-CM

## 2019-11-28 DIAGNOSIS — Z79899 Other long term (current) drug therapy: Secondary | ICD-10-CM | POA: Insufficient documentation

## 2019-11-28 LAB — URINALYSIS, ROUTINE W REFLEX MICROSCOPIC
Bilirubin Urine: NEGATIVE
Glucose, UA: NEGATIVE mg/dL
Ketones, ur: NEGATIVE mg/dL
Leukocytes,Ua: NEGATIVE
Nitrite: NEGATIVE
Protein, ur: NEGATIVE mg/dL
RBC / HPF: >50 RBC/hpf — ABNORMAL HIGH (ref 0–5)
Specific Gravity, Urine: 1.018 (ref 1.005–1.030)
pH: 6 (ref 5.0–8.0)

## 2019-11-28 LAB — PREGNANCY, URINE: Preg Test, Ur: NEGATIVE

## 2019-11-28 MED ORDER — DIAZEPAM 5 MG PO TABS: 2.5000 mg | ORAL_TABLET | Freq: Four times a day (QID) | ORAL | 0 refills | Status: DC | PRN

## 2019-11-28 MED ORDER — HYDROMORPHONE HCL 1 MG/ML IJ SOLN
0.5000 mg | Freq: Once | INTRAMUSCULAR | Status: AC
  Administered 2019-11-28: 0.5 mg via INTRAMUSCULAR
  Filled 2019-11-28: qty 1

## 2019-11-28 MED ORDER — DIAZEPAM 2 MG PO TABS
2.0000 mg | ORAL_TABLET | Freq: Once | ORAL | Status: AC
  Administered 2019-11-28: 2 mg via ORAL
  Filled 2019-11-28: qty 1

## 2019-11-28 MED ORDER — ACETAMINOPHEN 500 MG PO TABS
1000.0000 mg | ORAL_TABLET | Freq: Once | ORAL | Status: AC
  Administered 2019-11-28: 1000 mg via ORAL
  Filled 2019-11-28: qty 2

## 2019-11-28 MED ORDER — MELOXICAM 7.5 MG PO TABS: 7.5000 mg | ORAL_TABLET | Freq: Every day | ORAL | 0 refills | Status: AC

## 2019-11-28 MED ORDER — KETOROLAC TROMETHAMINE 60 MG/2ML IM SOLN
60.0000 mg | Freq: Once | INTRAMUSCULAR | Status: AC
  Administered 2019-11-28: 60 mg via INTRAMUSCULAR
  Filled 2019-11-28: qty 2

## 2019-11-28 NOTE — ED Provider Notes (Signed)
Emergency Department Provider Note   I have reviewed the triage vital signs and the nursing notes.   HISTORY  Chief Complaint Back Pain   HPI Debra Wade is a 48 y.o. female who presents the emergency department today for evaluation of bilateral lower back pain.  Patient states this started on Saturday as intermittent pain but then all day Sunday was constant.  To the point where she cannot lay down flat or sit up straight.  She has difficulty walking secondary to the pain but when resting in a slightly recumbent position she has minimal pain.  She has no urinary symptoms, GI symptoms, vaginal symptoms.  No recent history of trauma or infections.  She states she had episode similar to this in the past and it being a kidney infection.   Has tried heat, lidocaine, Tylenol without relief.  No other associated or modifying symptoms.    Past Medical History:  Diagnosis Date  . Arthritis   . Chicken pox   . Frequent headaches   . GERD (gastroesophageal reflux disease)   . Hypertension   . Stroke ([place])   . Syncope     Patient Active Problem List   Diagnosis Date Noted  . Orthostatic hypotension 01/09/2019  . Viral gastroenteritis 11/16/2018  . Tongue abnormality 09/15/2018  . Tobacco abuse 07/06/2017  . Essential hypertension 06/09/2017  . Fatigue 06/09/2017  . Snoring 06/09/2017    Past Surgical History:  Procedure Laterality Date  . TUBAL LIGATION      Current Outpatient Rx  . Order #: [id number] Class: Normal  . Order #: [id number] Class: Normal  . Order #: [id number] Class: Normal  . Order #: [id number] Class: Normal  . Order #: [id number] Class: Normal  . Order #: [id number] Class: No Print    Allergies Sulfa drugs cross reactors  Family History  Problem Relation Age of Onset  . Hypertension Mother   . Hypertension Father   . Heart disease Father   . Breast cancer Maternal Grandmother   . Prostate cancer Paternal Grandfather     Social History Social  History   Tobacco Use  . Smoking status: Current Every Day Smoker    Packs/day: 0.30    Years: 24.00    Pack years: 7.20    Types: Cigarettes  . Smokeless tobacco: Never Used  . Tobacco comment: about 8 cigarettes per day  Substance Use Topics  . Alcohol use: Yes    Comment: occ.   . Drug use: No    Review of Systems  All other systems negative except as documented in the HPI. All pertinent positives and negatives as reviewed in the HPI. ____________________________________________   PHYSICAL EXAM:  VITAL SIGNS: ED Triage Vitals  Enc Vitals Group     BP 11/28/19 0244 (!) 126/100     Pulse Rate 11/28/19 0244 86     Resp 11/28/19 0244 18     Temp 11/28/19 0244 98.2 F (36.8 C)     Temp Source 11/28/19 0244 Oral     SpO2 11/28/19 0244 99 %    Constitutional: Alert and oriented. Well appearing and in no acute distress. Eyes: Conjunctivae are normal. PERRL. EOMI. Head: Atraumatic. Nose: No congestion/rhinnorhea. Mouth/Throat: Mucous membranes are moist.  Oropharynx non-erythematous. Neck: No stridor.  No meningeal signs.   Cardiovascular: Normal rate, regular rhythm. Good peripheral circulation. Grossly normal heart sounds.   Respiratory: Normal respiratory effort.  No retractions. Lungs CTAB. Gastrointestinal: Soft and nontender. No distention.  Musculoskeletal: No lower extremity tenderness nor edema.  No gross deformities of extremities. Neurologic:  Normal speech and language. No gross focal neurologic deficits are appreciated.  Difficult to elicit reflexes in the patellar tendon but patient states this is normal, Achilles tendon symmetric, dorsiflexion and plantarflexion of bilateral feet is symmetric, sensation to light touch in distal lower extremities is symmetric. Skin:  Skin is warm, dry and intact. No rash noted.   ____________________________________________   LABS (all labs ordered are listed, but only abnormal results are displayed)  Labs Reviewed    URINALYSIS, ROUTINE W REFLEX MICROSCOPIC - Abnormal; Notable for the following components:      Result Value   APPearance HAZY (*)    Hgb urine dipstick LARGE (*)    RBC / HPF >50 (*)    Bacteria, UA RARE (*)    All other components within normal limits  PREGNANCY, URINE   ____________________________________________  RADIOLOGY  CT Renal Stone Study  Result Date: 11/28/2019 CLINICAL DATA:  Flank pain with kidney stone suspected EXAM: CT ABDOMEN AND PELVIS WITHOUT CONTRAST TECHNIQUE: Multidetector CT imaging of the abdomen and pelvis was performed following the standard protocol without IV contrast. COMPARISON:  11/29/2015 FINDINGS: Lower chest: Trace bilateral pleural effusion with mild atelectasis. Calcified granuloma in the left lower lobe. Hepatobiliary: Stable low-density in the upper left liver, presumably cyst.No evidence of biliary obstruction or stone. Pancreas: Unremarkable. Spleen: Unremarkable. Adrenals/Urinary Tract: Negative adrenals. No hydronephrosis or ureteral stone. Bilateral lower pole renal cysts, stable. There are 2 right upper pole renal lesions, 1 not simple cystic density with small calcifications but this was characterized as complicated cyst on 0000000 renal ultrasound. 18 mm maximal span on coronal reformats is stable compared to 2017. 5 mm left upper pole calculus. Unremarkable bladder. Stomach/Bowel:  No obstruction. No appendicitis. Vascular/Lymphatic: No acute vascular abnormality. No mass or adenopathy. Reproductive:24 mm cystic structure at the left adnexa compatible with dominant follicle based on patient age. Calcification along the left pelvic sidewall, favor exophytic fibroid. Other: No ascites or pneumoperitoneum. Musculoskeletal: No acute abnormalities. L5-S1 focal disc degeneration. IMPRESSION: 1. Trace bilateral pleural effusion. 2. Nonobstructive left nephrolithiasis. Electronically Signed   By: Monte Fantasia M.D.   On: 11/28/2019 04:43     ____________________________________________   PROCEDURES  Procedure(s) performed:   Procedures   ____________________________________________   INITIAL IMPRESSION / ASSESSMENT AND PLAN / ED COURSE  Pain certainly sounds muscular however she does not really have any tenderness over the area.  Will treat for muscular cause awaiting her urinalysis as this could be a kidney infection as well.  Workup as above. Likely MSK? Significant symptom improvement. Plan for symptomatic care at this time.   Clinical Course as of Nov 27 733  Mon Nov 28, 2019  [id number] Currently on menstrual cycle but will check for stone.   Hgb urine dipstick(!): LARGE [JM]    Clinical Course User Index [JM] Ikenna Ohms, Corene Cornea, MD    Pertinent labs & imaging results that were available during my care of the patient were reviewed by me and considered in my medical decision making (see chart for details).  A medical screening exam was performed and I feel the patient has had an appropriate workup for their chief complaint at this time and likelihood of emergent condition existing is low. They have been counseled on decision, discharge, follow up and which symptoms necessitate immediate return to the emergency department. They or their family verbally stated understanding and agreement with plan and discharged in stable condition.   ____________________________________________  FINAL CLINICAL  IMPRESSION(S) / ED DIAGNOSES  Final diagnoses:  Acute bilateral low back pain without sciatica     MEDICATIONS GIVEN DURING THIS VISIT:  Medications  ketorolac (TORADOL) injection 60 mg (60 mg Intramuscular Given 11/28/19 0407)  diazepam (VALIUM) tablet 2 mg (2 mg Oral Given 11/28/19 0405)  acetaminophen (TYLENOL) tablet 1,000 mg (1,000 mg Oral Given 11/28/19 0406)  HYDROmorphone (DILAUDID) injection 0.5 mg (0.5 mg Intramuscular Given 11/28/19 0730)     NEW OUTPATIENT MEDICATIONS STARTED DURING THIS VISIT:  New  Prescriptions   DIAZEPAM (VALIUM) 5 MG TABLET    Take 0.5 tablets (2.5 mg total) by mouth every 6 (six) hours as needed (spasms and back pain).   MELOXICAM (MOBIC) 7.5 MG TABLET    Take 1 tablet (7.5 mg total) by mouth daily for 7 days.    Note:  This note was prepared with assistance of Dragon voice recognition software. Occasional wrong-word or sound-a-like substitutions may have occurred due to the inherent limitations of voice recognition software.   Scottlynn Lindell, Corene Cornea, MD 11/28/19 [id number]

## 2019-11-28 NOTE — ED Triage Notes (Addendum)
Pt reports severe lower back pain that began Saturday, states pain is all across her lower back and radiates into her buttocks. Denies any known injury or trauma, using tylenol and lidocaine patches without relief, states she even bought a new mattress thinking that would help. Pt denies any urinary symptoms. Tearful in triage.

## 2019-11-30 ENCOUNTER — Other Ambulatory Visit: Payer: Self-pay

## 2019-11-30 ENCOUNTER — Encounter: Payer: Self-pay | Admitting: Family

## 2019-11-30 ENCOUNTER — Telehealth: Payer: Self-pay | Admitting: Family

## 2019-11-30 ENCOUNTER — Ambulatory Visit (INDEPENDENT_AMBULATORY_CARE_PROVIDER_SITE_OTHER): Payer: 59 | Admitting: Family

## 2019-11-30 ENCOUNTER — Other Ambulatory Visit: Payer: Self-pay | Admitting: Family

## 2019-11-30 VITALS — BP 130/98 | HR 84 | Temp 98.2°F | Ht 69.0 in | Wt 188.6 lb

## 2019-11-30 DIAGNOSIS — M545 Low back pain, unspecified: Secondary | ICD-10-CM

## 2019-11-30 DIAGNOSIS — J90 Pleural effusion, not elsewhere classified: Secondary | ICD-10-CM

## 2019-11-30 MED ORDER — TRAMADOL HCL 50 MG PO TABS: 50.0000 mg | ORAL_TABLET | Freq: Three times a day (TID) | ORAL | 0 refills | Status: AC | PRN

## 2019-11-30 MED ORDER — PREDNISONE 20 MG PO TABS: 40.0000 mg | ORAL_TABLET | Freq: Every day | ORAL | 0 refills | Status: DC

## 2019-11-30 MED ORDER — METHOCARBAMOL 500 MG PO TABS: 500.0000 mg | ORAL_TABLET | Freq: Three times a day (TID) | ORAL | 0 refills | Status: DC | PRN

## 2019-11-30 MED ORDER — METHYLPREDNISOLONE ACETATE 40 MG/ML IJ SUSP
40.0000 mg | Freq: Once | INTRAMUSCULAR | Status: AC
  Administered 2019-11-30: 40 mg via INTRAMUSCULAR

## 2019-11-30 NOTE — Progress Notes (Signed)
Debra Wade is a 48 y.o. female with the following history as recorded in Epic[employer]:  Patient Active Problem List   Diagnosis Date Noted  . Orthostatic hypotension 01/09/2019  . Viral gastroenteritis 11/16/2018  . Tongue abnormality 09/15/2018  . Tobacco abuse 07/06/2017  . Essential hypertension 06/09/2017  . Fatigue 06/09/2017  . Snoring 06/09/2017    Current Outpatient Medications  Medication Sig Dispense Refill  . carvedilol (COREG) 25 MG tablet Take 1 tablet (25 mg total) by mouth 2 (two) times daily. 180 tablet 3  . diazepam (VALIUM) 5 MG tablet Take 0.5 tablets (2.5 mg total) by mouth every 6 (six) hours as needed (spasms and back pain). 10 tablet 0  . ibuprofen (ADVIL) 600 MG tablet Take 1 tablet (600 mg total) by mouth every 6 (six) hours as needed. 30 tablet 0  . meloxicam (MOBIC) 7.5 MG tablet Take 1 tablet (7.5 mg total) by mouth daily for 7 days. 30 tablet 0  . ondansetron (ZOFRAN ODT) 4 MG disintegrating tablet Take 1 tablet (4 mg total) by mouth every 8 (eight) hours as needed for nausea or vomiting. 20 tablet 0  . spironolactone (ALDACTONE) 100 MG tablet Take 0.5 tablets (50 mg total) by mouth daily. 45 tablet 3  . methocarbamol (ROBAXIN) 500 MG tablet Take 1 tablet (500 mg total) by mouth every 8 (eight) hours as needed. 30 tablet 0  . predniSONE (DELTASONE) 20 MG tablet Take 2 tablets (40 mg total) by mouth daily with breakfast. 10 tablet 0  . traMADol (ULTRAM) 50 MG tablet Take 1 tablet (50 mg total) by mouth every 8 (eight) hours as needed for up to 5 days. 15 tablet 0   No current facility-administered medications for this visit.    Allergies: Sulfa drugs cross reactors  Past Medical History:  Diagnosis Date  . Arthritis   . Chicken pox   . Frequent headaches   . GERD (gastroesophageal reflux disease)   . Hypertension   . Stroke ([place])   . Syncope     Past Surgical History:  Procedure Laterality Date  . TUBAL LIGATION      Family History  Problem  Relation Age of Onset  . Hypertension Mother   . Hypertension Father   . Heart disease Father   . Breast cancer Maternal Grandmother   . Prostate cancer Paternal Grandfather     Social History   Tobacco Use  . Smoking status: Current Every Day Smoker    Packs/day: 0.30    Years: 24.00    Pack years: 7.20    Types: Cigarettes  . Smokeless tobacco: Never Used  . Tobacco comment: about 8 cigarettes per day  Substance Use Topics  . Alcohol use: Yes    Comment: occ.     Subjective:  Patient went to the ER 2 days ago with severe low back pain; CT scan was done which was normal- non- obstructing kidney stone found; notes that pain in her back "feels tight"- improves with stretching; denies any numbness or tingling; some relief with heat; currently on Meloxicam 15 mg daily/ unable to tolerate Valium due to fatigue; no benefit with Toradol given at ER; no changes in bowel habits;    Objective:  Vitals:   11/30/19 0841  BP: (!) 130/98  Pulse: 84  Temp: 98.2 F (36.8 C)  TempSrc: Oral  SpO2: 96%  Weight: 188 lb 9.6 oz (85.5 kg)  Height: 5\' 9"  (1.753 m)    General: Well developed, well nourished, in  mild distress- unable to find comfortable position in office  Skin : Warm and dry.  Head: Normocephalic and atraumatic  Lungs: Respirations unlabored; clear to auscultation bilaterally without wheeze, rales, rhonchi  Musculoskeletal: No deformities; no active joint inflammation  Extremities: No edema, cyanosis, clubbing  Vessels: Symmetric bilaterally  Neurologic: Alert and oriented; speech intact; face symmetrical; moves all extremities well; CNII-XII intact without focal deficit   Assessment:  1. Acute low back pain, unspecified back pain laterality, unspecified whether sciatica present   2. Pleural effusion     Plan:  1. Reviewed CT from ER- no kidney stones found; will treat for muscular source based on appearance in office; will hold X-ray today as our machine in office is not  working at time of OV; Depo-Medrol IM 40 given in office today; she will start oral prednisone 40 mg daily x 5 days tomorrow; Rx for Robaxin and Tramadol updated as well; apply heat and rest; work note given; if symptoms persist, she will need to see one of our sports medicine providers.  2. Noted on CT at ER; will have patient come back for CXR at later date when she is feeling better in the next 1-2 weeks.   This visit occurred during the SARS-CoV-2 public health emergency.  Safety protocols were in place, including screening questions prior to the visit, additional usage of staff PPE, and extensive cleaning of exam room while observing appropriate contact time as indicated for disinfecting solutions.     Follow-up to be determined;   No follow-ups on file.  No orders of the defined types were placed in this encounter.   Requested Prescriptions   Signed Prescriptions Disp Refills  . predniSONE (DELTASONE) 20 MG tablet 10 tablet 0    Sig: Take 2 tablets (40 mg total) by mouth daily with breakfast.  . traMADol (ULTRAM) 50 MG tablet 15 tablet 0    Sig: Take 1 tablet (50 mg total) by mouth every 8 (eight) hours as needed for up to 5 days.  . methocarbamol (ROBAXIN) 500 MG tablet 30 tablet 0    Sig: Take 1 tablet (500 mg total) by mouth every 8 (eight) hours as needed.

## 2019-11-30 NOTE — Telephone Encounter (Signed)
Spoke with patient and info given. S/he knows to return to the office around 12/16/19 for Chest xray.

## 2019-11-30 NOTE — Telephone Encounter (Signed)
I looked at her CT from the ER again. It did show a very small amount of fluid in both of her lungs- not overly concerning but I would like to get a repeat CXR in about 2 weeks. She should be able to come here to get this done. I will put in order. Can you see if she has to be put on a schedule?

## 2019-12-13 ENCOUNTER — Encounter: Payer: Self-pay | Admitting: Family

## 2019-12-26 ENCOUNTER — Encounter (HOSPITAL_BASED_OUTPATIENT_CLINIC_OR_DEPARTMENT_OTHER): Payer: 59 | Admitting: Cardiology

## 2020-01-13 ENCOUNTER — Other Ambulatory Visit: Payer: Self-pay | Admitting: Family

## 2020-01-13 DIAGNOSIS — Q6102 Congenital multiple renal cysts: Secondary | ICD-10-CM

## 2020-01-17 ENCOUNTER — Other Ambulatory Visit: Payer: Self-pay

## 2020-01-17 ENCOUNTER — Ambulatory Visit (INDEPENDENT_AMBULATORY_CARE_PROVIDER_SITE_OTHER)
Admission: RE | Admit: 2020-01-17 | Discharge: 2020-01-17 | Disposition: A | Discharge status: Home / Self Care | Payer: 59 | Source: Ambulatory Visit | Attending: Family | Admitting: Family

## 2020-01-17 ENCOUNTER — Telehealth: Payer: Self-pay

## 2020-01-17 DIAGNOSIS — J90 Pleural effusion, not elsewhere classified: Secondary | ICD-10-CM | POA: Diagnosis not present

## 2020-01-17 NOTE — Telephone Encounter (Signed)
New message     The patient  calling for chest xray test results  - done today.

## 2020-01-19 NOTE — Progress Notes (Signed)
Virtual Visit via Telephone Note   This visit type was conducted due to national recommendations for restrictions regarding the COVID-19 Pandemic (e.g. social distancing) in an effort to limit this patient's exposure and mitigate transmission in our community.  Due to her co-morbid illnesses, this patient is at least at moderate risk for complications without adequate follow up.  This format is felt to be most appropriate for this patient at this time.  All issues noted in this document were discussed and addressed.  A limited physical exam was performed with this format.  Please refer to the patient's chart for her consent to telehealth for [facility].   Evaluation Performed:  Follow-up visit  This visit type was conducted due to national recommendations for restrictions regarding the COVID-19 Pandemic (e.g. social distancing).  This format is felt to be most appropriate for this patient at this time.  All issues noted in this document were discussed and addressed.  No physical exam was performed (except for noted visual exam findings with Video Visits).  Please refer to the patient's chart (MyChart message for video visits and phone note for telephone visits) for the patient's consent to telehealth for [facility].  Date:  01/20/2020   ID:  Debra Wade, DOB 1971/03/31, MRN [id number]  Patient Location:  Home  Provider location:   [place]  PCP:  Binnie Rail, MD  Cardiologist:  Fransico Him, MD  Electrophysiologist:  None   Chief Complaint:  HTN  History of Present Illness:    Debra Wade is a 49 y.o. female who presents via audio/video conferencing for a telehealth visit today.    Debra Wade is a 49 y.o. female with a hx of HAs, HTN and CVA.  She has not tolerated amlodipine due to LE edema and had syncope and orthostatic hypotension after taking metoprolol and Losartan HCT.  She also has a history of preeclampsia. She was encouraged to use compression hose when  standing for long periods of time at work. She was seen back several times in HTN clinic and Carvedilol titrated for BP goal < 130/72mmHg.  She was started on spironolactone 25mg  daily and subsequently increased to 50mg  daily.  Carvedilol eventually increased to 25mg  BID.  She was seen back in 02/2018 and spiro increased to 50mg  daily.  Repeat OV 03/2018 showed BP had reached goal and patient was tolerating meds well.   She is here today for followup and is doing well.  She denies any chest pain or pressure, SOB, DOE, PND, orthopnea, LE edema, dizziness, palpitations or syncope. She is compliant with her meds and is tolerating meds with no SE.    The patient does not have symptoms concerning for COVID-19 infection (fever, chills, cough, or new shortness of breath).    Prior CV studies:   The following studies were reviewed today:  Outside labs from [facility]  Past Medical History:  Diagnosis Date  . Arthritis   . Chicken pox   . Frequent headaches   . GERD (gastroesophageal reflux disease)   . Hypertension   . Stroke ([place])   . Syncope    Past Surgical History:  Procedure Laterality Date  . TUBAL LIGATION       Current Meds  Medication Sig  . carvedilol (COREG) 25 MG tablet Take 1 tablet (25 mg total) by mouth 2 (two) times daily.  Marland Kitchen spironolactone (ALDACTONE) 100 MG tablet Take 0.5 tablets (50 mg total) by mouth daily.     Allergies:  Sulfa drugs cross reactors   Social History   Tobacco Use  . Smoking status: Current Every Day Smoker    Packs/day: 0.30    Years: 24.00    Pack years: 7.20    Types: Cigarettes  . Smokeless tobacco: Never Used  . Tobacco comment: about 8 cigarettes per day  Substance Use Topics  . Alcohol use: Yes    Comment: occ.   . Drug use: No     Family Hx: The patient's family history includes Breast cancer in her maternal grandmother; Heart disease in her father; Hypertension in her father and mother; Prostate cancer in her paternal  grandfather.  ROS:   Please see the history of present illness.     All other systems reviewed and are negative.   Labs/Other Tests and Data Reviewed:    Recent Labs: 09/02/2019: TSH 1.600 09/20/2019: BUN 16; Creatinine, Ser 1.12; Potassium 4.5; Sodium 139   Recent Lipid Panel Lab Results  Component Value Date/Time   CHOL 207 (H) 07/13/2008 08:18 PM   TRIG 76 07/13/2008 08:18 PM   HDL 52 07/13/2008 08:18 PM   CHOLHDL 4.0 Ratio 07/13/2008 08:18 PM   LDLCALC 140 (H) 07/13/2008 08:18 PM    Wt Readings from Last 3 Encounters:  01/20/20 185 lb (83.9 kg)  11/30/19 188 lb 9.6 oz (85.5 kg)  09/02/19 188 lb (85.3 kg)     Objective:    Vital Signs:  BP (!) 125/92 (BP Location: Left Arm)   Pulse 84   Ht 5\' 10"  (1.778 m)   Wt 185 lb (83.9 kg)   LMP 01/09/2020   BMI 26.54 kg/m    CONSTITUTIONAL:  Well nourished, well developed female in no acute distress.  EYES: anicteric MOUTH: oral mucosa is pink RESPIRATORY: Normal respiratory effort, symmetric expansion CARDIOVASCULAR: No peripheral edema SKIN: No rash, lesions or ulcers MUSCULOSKELETAL: no digital cyanosis NEURO: Cranial Nerves II-XII grossly intact, moves all extremities PSYCH: Intact judgement and insight.  A&O x 3, Mood/affect appropriate   ASSESSMENT & PLAN:    1.  HTN -BP borderline controlled -continue Carvedilol 25mg  BID and Spiro 50mg  daily -continue < 2gm Na diet -creatinine was 1.12 in Oct 2020 and K+ 4.5  2.  Orthostatic Hypotension -denies any issues recently with dizziness or syncope -continue to wear compression hose when working at her job that requires a lot of standing for long periods of time  3.  SOB -occurs when laying down and during the day -? Etiology  -denies any exertional CP or pressure -was noted to have small bilateral pleural effusions on renal CT in Dec 2020  -Cxray 01/2020 was normal -breathing has improved on the allergy medicine she started to take when laying down at night  but still having problems with she is up during the day  -she has to clear her throat a lot in the am due to congestion ? GERD - she tells me that if she eats and then lays down her food starts coming back up and gets burning.  Suspect she has GERD so will start Protonix 40mg  daily -2D echo to assess LVF and check BNP and CBC   COVID-19 Education: The signs and symptoms of COVID-19 were discussed with the patient and how to seek care for testing (follow up with PCP or arrange E-visit).  The importance of social distancing was discussed today.  Patient Risk:   After full review of this patient's clinical status, I feel that they are at least moderate risk  at this time.  Time:   Today, I have spent 20 minutes on telemedicine discussing medical problems including HTN, obesity and orthostatic hypotension and reviewing patient's chart including outside labs from PCP.  Medication Adjustments/Labs and Tests Ordered: Current medicines are reviewed at length with the patient today.  Concerns regarding medicines are outlined above.  Tests Ordered: No orders of the defined types were placed in this encounter.  Medication Changes: No orders of the defined types were placed in this encounter.   Disposition:  Follow up with me in 6 weeks  Signed, Fransico Him, MD  01/20/2020 9:39 AM    [place]

## 2020-01-20 ENCOUNTER — Telehealth (INDEPENDENT_AMBULATORY_CARE_PROVIDER_SITE_OTHER): Payer: 59 | Admitting: Cardiology

## 2020-01-20 ENCOUNTER — Encounter: Payer: Self-pay | Admitting: Cardiology

## 2020-01-20 ENCOUNTER — Other Ambulatory Visit: Payer: Self-pay | Admitting: Family

## 2020-01-20 ENCOUNTER — Telehealth: Payer: Self-pay

## 2020-01-20 ENCOUNTER — Other Ambulatory Visit: Payer: Self-pay

## 2020-01-20 VITALS — BP 125/92 | HR 84 | Ht 70.0 in | Wt 185.0 lb

## 2020-01-20 DIAGNOSIS — I10 Essential (primary) hypertension: Secondary | ICD-10-CM | POA: Diagnosis not present

## 2020-01-20 DIAGNOSIS — E669 Obesity, unspecified: Secondary | ICD-10-CM

## 2020-01-20 DIAGNOSIS — Z79899 Other long term (current) drug therapy: Secondary | ICD-10-CM

## 2020-01-20 DIAGNOSIS — R0989 Other specified symptoms and signs involving the circulatory and respiratory systems: Secondary | ICD-10-CM

## 2020-01-20 DIAGNOSIS — R0602 Shortness of breath: Secondary | ICD-10-CM

## 2020-01-20 DIAGNOSIS — I951 Orthostatic hypotension: Secondary | ICD-10-CM

## 2020-01-20 MED ORDER — PANTOPRAZOLE SODIUM 40 MG PO TBEC: 40.0000 mg | DELAYED_RELEASE_TABLET | Freq: Every day | ORAL | 3 refills | Status: DC

## 2020-01-20 MED ORDER — SPIRONOLACTONE 100 MG PO TABS: 50.0000 mg | ORAL_TABLET | Freq: Every day | ORAL | 3 refills | Status: DC

## 2020-01-20 NOTE — Telephone Encounter (Signed)
Spoke with patient and info given 

## 2020-01-20 NOTE — Patient Instructions (Signed)
Medication Instructions:  Your physician has recommended you make the following change in your medication:  1) START taking Protonix (pantoprazole) 40 mg daily   *If you need a refill on your cardiac medications before your next appointment, please call your pharmacy*  Lab Work: BNP and Hgb   If you have labs (blood work) drawn today and your tests are completely normal, you will receive your results only by: Marland Kitchen MyChart Message (if you have MyChart) OR . A paper copy in the mail If you have any lab test that is abnormal or we need to change your treatment, we will call you to review the results.  Tests/Procedures Your physician has requested that you have an echocardiogram. Echocardiography is a painless test that uses sound waves to create images of your heart. It provides your doctor with information about the size and shape of your heart and how well your heart's chambers and valves are working. This procedure takes approximately one hour. There are no restrictions for this procedure.  Follow-Up: At [facility], you and your health needs are our priority.  As part of our continuing mission to provide you with exceptional heart care, we have created designated Provider Care Teams.  These Care Teams include your primary Cardiologist (physician) and Advanced Practice Providers (APPs -  Physician Assistants and Nurse Practitioners) who all work together to provide you with the care you need, when you need it.  Your next appointment:   Friday, March 26th, 2021 at 8:00am  The format for your next appointment:   Virtual Visit   Provider:   Fransico Him, MD

## 2020-01-20 NOTE — Telephone Encounter (Signed)
YOUR CARDIOLOGY TEAM HAS ARRANGED FOR AN E-VISIT FOR YOUR APPOINTMENT - PLEASE REVIEW IMPORTANT INFORMATION BELOW SEVERAL DAYS PRIOR TO YOUR APPOINTMENT  Due to the recent COVID-19 pandemic, we are transitioning in-person office visits to tele-medicine visits in an effort to decrease unnecessary exposure to our patients, their families, and staff. These visits are billed to your insurance just like a normal visit is. We also encourage you to sign up for MyChart if you have not already done so. You will need a smartphone if possible. For patients that do not have this, we can still complete the visit using a regular telephone but do prefer a smartphone to enable video when possible. You may have a family member that lives with you that can help. If possible, we also ask that you have a blood pressure cuff and scale at home to measure your blood pressure, heart rate and weight prior to your scheduled appointment. Patients with clinical needs that need an in-person evaluation and testing will still be able to come to the office if absolutely necessary. If you have any questions, feel free to call our office.     Marland Kitchen YOUR PROVIDER WILL BE USING THE FOLLOWING PLATFORM TO COMPLETE YOUR VISIT: Doximity   . IF USING DOXIMITY or [email or web address] - The staff will give you instructions on receiving your link to join the meeting the day of your visit.      THE DAY OF YOUR APPOINTMENT  Approximately 15 minutes prior to your scheduled appointment, you will receive a telephone call from one of [facility] team - your caller ID may say "Unknown caller."  Our staff will confirm medications, vital signs for the day and any symptoms you may be experiencing. Please have this information available prior to the time of visit start. It may also be helpful for you to have a pad of paper and pen handy for any instructions given during your visit. They will also walk you through joining the smartphone meeting if this is a video  visit.    CONSENT FOR TELE-HEALTH VISIT - PLEASE REVIEW  I hereby voluntarily request, consent and authorize [facility] and its employed or contracted physicians, physician assistants, nurse practitioners or other licensed health care professionals (the Practitioner), to provide me with telemedicine health care services (the "Services") as deemed necessary by the treating Practitioner. I acknowledge and consent to receive the Services by the Practitioner via telemedicine. I understand that the telemedicine visit will involve communicating with the Practitioner through live audiovisual communication technology and the disclosure of certain medical information by electronic transmission. I acknowledge that I have been given the opportunity to request an in-person assessment or other available alternative prior to the telemedicine visit and am voluntarily participating in the telemedicine visit.  I understand that I have the right to withhold or withdraw my consent to the use of telemedicine in the course of my care at any time, without affecting my right to future care or treatment, and that the Practitioner or I may terminate the telemedicine visit at any time. I understand that I have the right to inspect all information obtained and/or recorded in the course of the telemedicine visit and may receive copies of available information for a reasonable fee.  I understand that some of the potential risks of receiving the Services via telemedicine include:  Marland Kitchen Delay or interruption in medical evaluation due to technological equipment failure or disruption; . Information transmitted may not be sufficient (e.g. poor resolution of images) to  allow for appropriate medical decision making by the Practitioner; and/or  . In rare instances, security protocols could fail, causing a breach of personal health information.  Furthermore, I acknowledge that it is my responsibility to provide information about my medical  history, conditions and care that is complete and accurate to the best of my ability. I acknowledge that Practitioner's advice, recommendations, and/or decision may be based on factors not within their control, such as incomplete or inaccurate data provided by me or distortions of diagnostic images or specimens that may result from electronic transmissions. I understand that the practice of medicine is not an exact science and that Practitioner makes no warranties or guarantees regarding treatment outcomes. I acknowledge that I will receive a copy of this consent concurrently upon execution via email to the email address I last provided but may also request a printed copy by calling the office of [facility].    I understand that my insurance will be billed for this visit.   I have read or had this consent read to me. . I understand the contents of this consent, which adequately explains the benefits and risks of the Services being provided via telemedicine.  . I have been provided ample opportunity to ask questions regarding this consent and the Services and have had my questions answered to my satisfaction. . I give my informed consent for the services to be provided through the use of telemedicine in my medical care  By participating in this telemedicine visit I agree to the above.

## 2020-01-20 NOTE — Telephone Encounter (Signed)
I did referral to pulmonology. She needs to complete the testing that cardiology wants done as well.

## 2020-01-22 ENCOUNTER — Other Ambulatory Visit: Payer: Self-pay | Admitting: Cardiology

## 2020-01-30 ENCOUNTER — Telehealth: Payer: Self-pay | Admitting: Internal Medicine

## 2020-01-30 NOTE — Telephone Encounter (Signed)
I received [employer] forms to be completed for dates 12/21 to 12/27. That OV was with Jodi Mourning on 12/23.   Forms have been completed &Placed in Murray's box to review and sign.

## 2020-01-31 DIAGNOSIS — Z0279 Encounter for issue of other medical certificate: Secondary | ICD-10-CM

## 2020-01-31 NOTE — Telephone Encounter (Signed)
Forms have been signed, Faxed to [phone], Copy sent to scan &Charged for.   LVM to inform, Original mailed to patient for her records.

## 2020-02-01 ENCOUNTER — Other Ambulatory Visit: Payer: Self-pay

## 2020-02-01 ENCOUNTER — Other Ambulatory Visit: Payer: 59 | Admitting: *Deleted

## 2020-02-01 ENCOUNTER — Ambulatory Visit (HOSPITAL_COMMUNITY): Payer: 59 | Attending: Cardiology

## 2020-02-01 DIAGNOSIS — E669 Obesity, unspecified: Secondary | ICD-10-CM | POA: Diagnosis not present

## 2020-02-01 DIAGNOSIS — Z79899 Other long term (current) drug therapy: Secondary | ICD-10-CM | POA: Diagnosis not present

## 2020-02-01 DIAGNOSIS — I951 Orthostatic hypotension: Secondary | ICD-10-CM | POA: Diagnosis not present

## 2020-02-01 DIAGNOSIS — I10 Essential (primary) hypertension: Secondary | ICD-10-CM

## 2020-02-02 LAB — HEMOGLOBIN: Hemoglobin: 14.5 g/dL (ref 11.1–15.9)

## 2020-02-02 LAB — PRO B NATRIURETIC PEPTIDE: NT-Pro BNP: 22 pg/mL (ref 0–249)

## 2020-02-06 ENCOUNTER — Telehealth: Payer: Self-pay | Admitting: Family

## 2020-02-06 NOTE — Telephone Encounter (Signed)
-----   Message from Octavio Manns sent at 02/06/2020 10:45 AM EST ----- FYI:  Pt was referred to a nephrologist because of her renal cysts but they stated she needs to see a urologist.   Dennis Bast put a referral in and it was sent to [facility].   Just got message that pt declined to schedule appt.

## 2020-02-20 ENCOUNTER — Ambulatory Visit: Payer: 59 | Admitting: Critical Care Medicine

## 2020-02-29 NOTE — Progress Notes (Deleted)
Virtual Visit via Telephone Note   This visit type was conducted due to national recommendations for restrictions regarding the COVID-19 Pandemic (e.g. social distancing) in an effort to limit this patient's exposure and mitigate transmission in our community.  Due to her co-morbid illnesses, this patient is at least at moderate risk for complications without adequate follow up.  This format is felt to be most appropriate for this patient at this time.  The patient did not have access to video technology/had technical difficulties with video requiring transitioning to audio format only (telephone).  All issues noted in this document were discussed and addressed.  No physical exam could be performed with this format.  Please refer to the patient's chart for her  consent to telehealth for [facility].   Evaluation Performed:  Follow-up visit  This visit type was conducted due to national recommendations for restrictions regarding the COVID-19 Pandemic (e.g. social distancing).  This format is felt to be most appropriate for this patient at this time.  All issues noted in this document were discussed and addressed.  No physical exam was performed (except for noted visual exam findings with Video Visits).  Please refer to the patient's chart (MyChart message for video visits and phone note for telephone visits) for the patient's consent to telehealth for [facility].  Date:  02/29/2020   ID:  BLIMIE Wade, DOB [birth year], MRN [id number]  Patient Location:  Home  Provider location:   [place]  PCP:  Binnie Rail, MD  Cardiologist:  Fransico Him, MD  Electrophysiologist:  None   Chief Complaint:  HTN  History of Present Illness:    Debra Wade is a 49 y.o. female who presents via audio/video conferencing for a telehealth visit today.    Debra Wade a 49 y.o.femalewith a hx of HAs, HTN and CVA. She has not tolerated amlodipine due to LE edema and had syncope and  orthostatic hypotension after taking metoprolol and Losartan HCT. She also has a history of preeclampsia. She was encouraged to use compression hose when standing for long periods of time at work. She was seen back several times in HTN clinic and Carvedilol titrated for BP goal <130/52mmHg. She was started on spironolactone 25mg  daily and subsequently increased to 50mg  daily. Carvedilol eventually increased to 25mg  BID. She was seen back in 02/2018 and spiro increased to 50mg  daily. Repeat OV 03/2018 showed BP had reached goal and patient was tolerating meds well.   She was seen by me in February and BP was fairly well controlled.  She did not have any further dizzy spells but was complaining of SOB when laying down and during the day.  She started allergy medication that has helped.  Her SOB was worse with laying down after eating and would have regurgitation of food with chest burning.  She was started on Protonix 40mg  daily and 2D echo was done which was normal.  BNP and TSH were normal.  She is here today for followup and is doing well.  She denies any chest pain or pressure, SOB, DOE, PND, orthopnea, LE edema, dizziness, palpitations or syncope. She is compliant with her meds and is tolerating meds with no SE.    The patient does not have symptoms concerning for COVID-19 infection (fever, chills, cough, or new shortness of breath).    Prior CV studies:   The following studies were reviewed today:  2D echo and labs  Past Medical History:  Diagnosis Date  .  Arthritis   . Chicken pox   . Frequent headaches   . GERD (gastroesophageal reflux disease)   . Hypertension   . Stroke ([place])   . Syncope    Past Surgical History:  Procedure Laterality Date  . TUBAL LIGATION       No outpatient medications have been marked as taking for the 03/02/20 encounter (Appointment) with Sueanne Margarita, MD.     Allergies:   Sulfa drugs cross reactors   Social History   Tobacco Use  . Smoking  status: Current Every Day Smoker    Packs/day: 0.30    Years: 24.00    Pack years: 7.20    Types: Cigarettes  . Smokeless tobacco: Never Used  . Tobacco comment: about 8 cigarettes per day  Substance Use Topics  . Alcohol use: Yes    Comment: occ.   . Drug use: No     Family Hx: The patient's family history includes Breast cancer in her maternal grandmother; Heart disease in her father; Hypertension in her father and mother; Prostate cancer in her paternal grandfather.  ROS:   Please see the history of present illness.     All other systems reviewed and are negative.   Labs/Other Tests and Data Reviewed:    Recent Labs: 09/02/2019: TSH 1.600 09/20/2019: BUN 16; Creatinine, Ser 1.12; Potassium 4.5; Sodium 139 02/01/2020: Hemoglobin 14.5; NT-Pro BNP 22   Recent Lipid Panel Lab Results  Component Value Date/Time   CHOL 207 (H) 07/13/2008 08:18 PM   TRIG 76 07/13/2008 08:18 PM   HDL 52 07/13/2008 08:18 PM   CHOLHDL 4.0 Ratio 07/13/2008 08:18 PM   LDLCALC 140 (H) 07/13/2008 08:18 PM    Wt Readings from Last 3 Encounters:  01/20/20 185 lb (83.9 kg)  11/30/19 188 lb 9.6 oz (85.5 kg)  09/02/19 188 lb (85.3 kg)     Objective:    Vital Signs:  There were no vitals taken for this visit.   CONSTITUTIONAL:  Well nourished, well developed female in no acute distress.  EYES: anicteric MOUTH: oral mucosa is pink RESPIRATORY: Normal respiratory effort, symmetric expansion CARDIOVASCULAR: No peripheral edema SKIN: No rash, lesions or ulcers MUSCULOSKELETAL: no digital cyanosis NEURO: Cranial Nerves II-XII grossly intact, moves all extremities PSYCH: Intact judgement and insight.  A&O x 3, Mood/affect appropriate   ASSESSMENT & PLAN:    1.  SOB  2.  HTN -BP is controlled -continue Carvedilol 25mg  BID and s  3.  Orthostatic hypotension  COVID-19 Education: The signs and symptoms of COVID-19 were discussed with the patient and how to seek care for testing (follow up  with PCP or arrange E-visit).  The importance of social distancing was discussed today.  Patient Risk:   After full review of this patient's clinical status, I feel that they are at least moderate risk at this time.  Time:   Today, I have spent *** minutes on *** discussing medical problems including *** and reviewing patient's chart including ***.  Medication Adjustments/Labs and Tests Ordered: Current medicines are reviewed at length with the patient today.  Concerns regarding medicines are outlined above.  Tests Ordered: No orders of the defined types were placed in this encounter.  Medication Changes: No orders of the defined types were placed in this encounter.   Disposition:  Follow up {follow up:15908}  Signed, Fransico Him, MD  02/29/2020 9:55 PM    [facility]

## 2020-03-02 ENCOUNTER — Other Ambulatory Visit: Payer: Self-pay

## 2020-03-02 ENCOUNTER — Telehealth: Payer: 59 | Admitting: Cardiology

## 2020-03-29 ENCOUNTER — Other Ambulatory Visit: Payer: Self-pay

## 2020-03-29 ENCOUNTER — Telehealth (INDEPENDENT_AMBULATORY_CARE_PROVIDER_SITE_OTHER): Payer: 59 | Admitting: Cardiology

## 2020-03-29 ENCOUNTER — Encounter: Payer: Self-pay | Admitting: Cardiology

## 2020-03-29 VITALS — BP 122/102 | HR 83 | Ht 70.0 in

## 2020-03-29 DIAGNOSIS — R0602 Shortness of breath: Secondary | ICD-10-CM

## 2020-03-29 DIAGNOSIS — I10 Essential (primary) hypertension: Secondary | ICD-10-CM

## 2020-03-29 DIAGNOSIS — I951 Orthostatic hypotension: Secondary | ICD-10-CM

## 2020-03-29 MED ORDER — CARVEDILOL 25 MG PO TABS: 25.0000 mg | ORAL_TABLET | Freq: Two times a day (BID) | ORAL | 3 refills | Status: DC

## 2020-03-29 NOTE — Progress Notes (Signed)
Cardiology Office Note:        Virtual Visit via Telephone Note   This visit type was conducted due to national recommendations for restrictions regarding the COVID-19 Pandemic (e.g. social distancing) in an effort to limit this patient's exposure and mitigate transmission in our community.  Due to her co-morbid illnesses, this patient is at least at moderate risk for complications without adequate follow up.  This format is felt to be most appropriate for this patient at this time.  The patient did not have access to video technology/had technical difficulties with video requiring transitioning to audio format only (telephone).  All issues noted in this document were discussed and addressed.  No physical exam could be performed with this format.  Please refer to the patient's chart for her  consent to telehealth for [facility].  Evaluation Performed:  Follow-up visit  This visit type was conducted due to national recommendations for restrictions regarding the COVID-19 Pandemic (e.g. social distancing).  This format is felt to be most appropriate for this patient at this time.  All issues noted in this document were discussed and addressed.  No physical exam was performed (except for noted visual exam findings with Video Visits).  Please refer to the patient's chart (MyChart message for video visits and phone note for telephone visits) for the patient's consent to telehealth for [facility].  Date:  03/29/2020   ID:  Debra Wade, DOB September 04, 1971, MRN [id number]  Patient Location:  Home  Provider location:   [facility]  Date:  03/29/2020   ID:  Debra Wade, DOB Apr 11, 1971, MRN [id number]  PCP:  Binnie Rail, MD  Cardiologist:  Fransico Him, MD    Referring MD: Binnie Rail, MD   Chief Complaint  Patient presents with  . Follow-up    HTN, othostatic hypotension, SOB    History of Present Illness:    Debra Wade is a 49 y.o. female with a hx of HAs, HTN and CVA.  She has not tolerated amlodipine due to LE edema and had syncope and orthostatic hypotension after taking metoprolol and Losartan HCT. She also has a history of preeclampsia. She was encouraged to use compression hose when standing for long periods of time at work. She was seen back several times in HTN clinic and Carvedilol titrated for BP goal <130/70mmHg. She was started on spironolactone 25mg  daily and subsequently increased to 50mg  daily. Carvedilol eventually increased to 25mg  BID. She was seen back in 02/2018 and spiro increased to 50mg  daily. Repeat OV 03/2018 showed BP had reached goal and patient was tolerating meds well.   I saw her in Feb 2021 and she was complaining of SOB worse when laying down.   She was having to clear her throat a lot and was placed on Protonix for possible GERD.  2D echo was normal as well as labs including BNP.  She is here today for followup and is doing well.  She denies any chest pain or pressure, SOB, DOE, PND, orthopnea, LE edema, dizziness, palpitations or syncope.  She tells me that the Protonix has significantly helped and her SOB has resolved.  She is compliant with her meds and is tolerating meds with no SE.    Past Medical History:  Diagnosis Date  . Arthritis   . Chicken pox   . Frequent headaches   . GERD (gastroesophageal reflux disease)   . Hypertension   . Stroke ([place])   . Syncope  Past Surgical History:  Procedure Laterality Date  . TUBAL LIGATION      Current Medications: Current Meds  Medication Sig  . Ascorbic Acid (VITAMIN C GUMMIE PO) Take by mouth.  . carvedilol (COREG) 25 MG tablet Take 1 tablet (25 mg total) by mouth 2 (two) times daily.  . Cyanocobalamin (VITAMIN B-12 PO) Take by mouth.  . Omega-3 Fatty Acids (FISH OIL) 500 MG CAPS Take 500 mg by mouth.  . pantoprazole (PROTONIX) 40 MG tablet Take 1 tablet (40 mg total) by mouth daily.  Marland Kitchen spironolactone (ALDACTONE) 100 MG tablet Take 0.5 tablets (50 mg total) by mouth  daily.  . [DISCONTINUED] carvedilol (COREG) 25 MG tablet Take 1 tablet (25 mg total) by mouth 2 (two) times daily.     Allergies:   Sulfa drugs cross reactors   Social History   Socioeconomic History  . Marital status: Single    Spouse name: Not on file  . Number of children: 5  . Years of education: 25  . Highest education level: Not on file  Occupational History  . Occupation: Med [employer]  . Smoking status: Current Every Day Smoker    Packs/day: 0.30    Years: 24.00    Pack years: 7.20    Types: Cigarettes  . Smokeless tobacco: Never Used  . Tobacco comment: about 8 cigarettes per day  Substance and Sexual Activity  . Alcohol use: Yes    Comment: occ.   . Drug use: No  . Sexual activity: Not on file  Other Topics Concern  . Not on file  Social History Narrative   Fun/Hobby: [place], anything relaxing   Denies abuse and feels safe at home.   Social Determinants of Health   Financial Resource Strain:   . Difficulty of Paying Living Expenses:   Food Insecurity:   . Worried About Charity fundraiser in the Last Year:   . Arboriculturist in the Last Year:   Transportation Needs:   . Film/video editor (Medical):   Marland Kitchen Lack of Transportation (Non-Medical):   Physical Activity:   . Days of Exercise per Week:   . Minutes of Exercise per Session:   Stress:   . Feeling of Stress :   Social Connections:   . Frequency of Communication with Friends and Family:   . Frequency of Social Gatherings with Friends and Family:   . Attends Religious Services:   . Active Member of Clubs or Organizations:   . Attends Archivist Meetings:   Marland Kitchen Marital Status:      Family History: The patient's family history includes Breast cancer in her maternal grandmother; Heart disease in her father; Hypertension in her father and mother; Prostate cancer in her paternal grandfather.  ROS:   Please see the history of present illness.    ROS  All other systems reviewed  and negative.   EKGs/Labs/Other Studies Reviewed:    The following studies were reviewed today: OV notes, 2D echo, labs  EKG:  EKG is not ordered today.    Recent Labs: 09/02/2019: TSH 1.600 09/20/2019: BUN 16; Creatinine, Ser 1.12; Potassium 4.5; Sodium 139 02/01/2020: Hemoglobin 14.5; NT-Pro BNP 22   Recent Lipid Panel    Component Value Date/Time   CHOL 207 (H) 07/13/2008 2018   TRIG 76 07/13/2008 2018   HDL 52 07/13/2008 2018   CHOLHDL 4.0 Ratio 07/13/2008 2018   VLDL 15 07/13/2008 2018   LDLCALC 140 (H) 07/13/2008 2018  Physical Exam:    VS:  BP (!) 122/102   Pulse 83   Ht 5\' 10"  (1.778 m)   BMI 26.54 kg/m     Wt Readings from Last 3 Encounters:  01/20/20 185 lb (83.9 kg)  11/30/19 188 lb 9.6 oz (85.5 kg)  09/02/19 188 lb (85.3 kg)     GEN:  Well nourished, well developed in no acute distress HEENT: Normal NECK: No JVD; No carotid bruits LYMPHATICS: No lymphadenopathy CARDIAC: RRR, no murmurs, rubs, gallops RESPIRATORY:  Clear to auscultation without rales, wheezing or rhonchi  ABDOMEN: Soft, non-tender, non-distended MUSCULOSKELETAL:  No edema; No deformity  SKIN: Warm and dry NEUROLOGIC:  Alert and oriented x 3 PSYCHIATRIC:  Normal affect   ASSESSMENT:    1. Essential hypertension   2. Orthostatic hypotension   3. SOB (shortness of breath)    PLAN:    In order of problems listed above:  1.  HTN -BP not controlled this am but she has not taken her meds this am.  At home her BP has been normal -continue Carvedilol 25mg  BID and spiro 50mg  daily  2.  Orthostatic Hypotension -no further dizziness or syncope -continue to wear compression hose when working at her job that requires a lot of standing for long periods of time  3.  SOB -occured when laying down and during the day -denies any exertional CP or pressure -was noted to have small bilateral pleural effusions on renal CT in Dec 2020  -Cxray 01/2020 was normal -breathing has improved on  the allergy medicine she started to take when laying down at night -she was having to clear her throat a lot in the am due to congestion ? GERD -Suspected  GERD and started Protonix 40mg  daily -2D echo, BNP and renal function were normal -symptoms have resolved on Protonix  Patient Risk:   After full review of this patient's clinical status, I feel that they are at least moderate risk at this time.  Time:   Today, I have spent 20 minutes on telemedicine discussing medical problems including HTN, SOB and reviewing patient's chart including 2D echo, labs.  Followup:  1 year with Dr. Radford Pax  Medication Adjustments/Labs and Tests Ordered: Current medicines are reviewed at length with the patient today.  Concerns regarding medicines are outlined above.  No orders of the defined types were placed in this encounter.  Meds ordered this encounter  Medications  . carvedilol (COREG) 25 MG tablet    Sig: Take 1 tablet (25 mg total) by mouth 2 (two) times daily.    Dispense:  180 tablet    Refill:  3    Signed, Fransico Him, MD  03/29/2020 8:15 AM    [facility]

## 2020-05-08 ENCOUNTER — Other Ambulatory Visit: Payer: Self-pay

## 2020-05-08 MED ORDER — SPIRONOLACTONE 100 MG PO TABS: 50.0000 mg | ORAL_TABLET | Freq: Every day | ORAL | 3 refills | Status: DC

## 2020-05-08 MED ORDER — PANTOPRAZOLE SODIUM 40 MG PO TBEC: 40.0000 mg | DELAYED_RELEASE_TABLET | Freq: Every day | ORAL | 3 refills | Status: DC

## 2020-05-08 MED ORDER — CARVEDILOL 25 MG PO TABS: 25.0000 mg | ORAL_TABLET | Freq: Two times a day (BID) | ORAL | 3 refills | Status: DC

## 2020-12-08 ENCOUNTER — Ambulatory Visit (HOSPITAL_COMMUNITY)
Admission: EM | Admit: 2020-12-08 | Discharge: 2020-12-08 | Disposition: A | Discharge status: Home / Self Care | Payer: 59 | Attending: Internal Medicine | Admitting: Internal Medicine

## 2020-12-08 ENCOUNTER — Encounter (HOSPITAL_COMMUNITY): Payer: Self-pay

## 2020-12-08 ENCOUNTER — Ambulatory Visit (INDEPENDENT_AMBULATORY_CARE_PROVIDER_SITE_OTHER): Payer: 59

## 2020-12-08 ENCOUNTER — Other Ambulatory Visit: Payer: Self-pay

## 2020-12-08 DIAGNOSIS — Z20822 Contact with and (suspected) exposure to covid-19: Secondary | ICD-10-CM | POA: Insufficient documentation

## 2020-12-08 DIAGNOSIS — R059 Cough, unspecified: Secondary | ICD-10-CM | POA: Diagnosis not present

## 2020-12-08 DIAGNOSIS — R6889 Other general symptoms and signs: Secondary | ICD-10-CM | POA: Diagnosis not present

## 2020-12-08 DIAGNOSIS — R61 Generalized hyperhidrosis: Secondary | ICD-10-CM | POA: Diagnosis not present

## 2020-12-08 LAB — RESP PANEL BY RT-PCR (FLU A&B, COVID) ARPGX2
Influenza A by PCR: NEGATIVE
Influenza B by PCR: NEGATIVE
SARS Coronavirus 2 by RT PCR: NEGATIVE

## 2020-12-08 MED ORDER — PREDNISONE 50 MG PO TABS: ORAL_TABLET | ORAL | 0 refills | Status: DC

## 2020-12-08 MED ORDER — ALBUTEROL SULFATE HFA 108 (90 BASE) MCG/ACT IN AERS: 2.0000 | INHALATION_SPRAY | RESPIRATORY_TRACT | 0 refills | Status: DC | PRN

## 2020-12-08 MED ORDER — HYDROCODONE-HOMATROPINE 5-1.5 MG/5ML PO SYRP: 5.0000 mL | ORAL_SOLUTION | Freq: Every day | ORAL | 0 refills | Status: DC

## 2020-12-08 MED ORDER — OSELTAMIVIR PHOSPHATE 75 MG PO CAPS: 75.0000 mg | ORAL_CAPSULE | Freq: Two times a day (BID) | ORAL | 0 refills | Status: DC

## 2020-12-08 NOTE — ED Provider Notes (Signed)
[facility]    CSN: [id number] Arrival date & time: 12/08/20  1704      History   Chief Complaint Chief Complaint  Patient presents with  . Wheezing  . Cough  . Chills    HPI Debra Wade is a 50 y.o. female who presents with cough, chills and chest congestion x 2 days. Has been having night sweats. Her finace tested positive for covid 3 days ago. Has been very fatigued. Has heavy feeling on chest and has been wheezing at night.     Past Medical History:  Diagnosis Date  . Arthritis   . Chicken pox   . Frequent headaches   . GERD (gastroesophageal reflux disease)   . Hypertension   . Stroke ([place])   . Syncope     Patient Active Problem List   Diagnosis Date Noted  . Orthostatic hypotension 01/09/2019  . Viral gastroenteritis 11/16/2018  . Tongue abnormality 09/15/2018  . Tobacco abuse 07/06/2017  . Essential hypertension 06/09/2017  . Fatigue 06/09/2017  . Snoring 06/09/2017    Past Surgical History:  Procedure Laterality Date  . TUBAL LIGATION      OB History   No obstetric history on file.      Home Medications    Prior to Admission medications   Medication Sig Start Date End Date Taking? Authorizing Provider  albuterol (VENTOLIN HFA) 108 (90 Base) MCG/ACT inhaler Inhale 2 puffs into the lungs every 4 (four) hours as needed for wheezing or shortness of breath. 12/08/20  Yes Rodriguez-Southworth, Sunday Spillers, PA-C  HYDROcodone-homatropine (HYCODAN) 5-1.5 MG/5ML syrup Take 5 mLs by mouth at bedtime. Prn cough 12/08/20  Yes Rodriguez-Southworth, Sunday Spillers, PA-C  oseltamivir (TAMIFLU) 75 MG capsule Take 1 capsule (75 mg total) by mouth every 12 (twelve) hours. 12/08/20  Yes Rodriguez-Southworth, Sunday Spillers, PA-C  predniSONE (DELTASONE) 50 MG tablet One qd 12/08/20  Yes Rodriguez-Southworth, Sunday Spillers, PA-C  Ascorbic Acid (VITAMIN C GUMMIE PO) Take by mouth.    [provider]  carvedilol (COREG) 25 MG tablet Take 1 tablet (25 mg total) by mouth 2 (two)  times daily. 05/08/20 05/08/21  Sueanne Margarita, MD  Cyanocobalamin (VITAMIN B-12 PO) Take by mouth.    [provider]  Omega-3 Fatty Acids (FISH OIL) 500 MG CAPS Take 500 mg by mouth.    [provider]  pantoprazole (PROTONIX) 40 MG tablet Take 1 tablet (40 mg total) by mouth daily. 05/08/20   Sueanne Margarita, MD  spironolactone (ALDACTONE) 100 MG tablet Take 0.5 tablets (50 mg total) by mouth daily. 05/08/20   Sueanne Margarita, MD    Family History Family History  Problem Relation Age of Onset  . Hypertension Mother   . Hypertension Father   . Heart disease Father   . Breast cancer Maternal Grandmother   . Prostate cancer Paternal Grandfather     Social History Social History   Tobacco Use  . Smoking status: Current Every Day Smoker    Packs/day: 0.30    Years: 24.00    Pack years: 7.20    Types: Cigarettes  . Smokeless tobacco: Never Used  . Tobacco comment: about 8 cigarettes per day  Vaping Use  . Vaping Use: Never used  Substance Use Topics  . Alcohol use: Yes    Comment: occ.   . Drug use: No     Allergies   Other and Sulfa drugs cross reactors   Review of Systems Review of Systems  Constitutional: Positive for activity change,  chills, diaphoresis, fatigue and fever. Negative for appetite change.  HENT: Positive for congestion, postnasal drip and rhinorrhea. Negative for ear discharge, ear pain, sore throat and trouble swallowing.   Eyes: Negative for discharge.  Respiratory: Positive for cough, chest tightness and wheezing. Negative for shortness of breath.   Cardiovascular: Negative for chest pain.  Gastrointestinal: Positive for nausea. Negative for abdominal pain, diarrhea and vomiting.  Musculoskeletal: Positive for myalgias. Negative for gait problem, neck pain and neck stiffness.  Skin: Negative for rash.  Neurological: Positive for headaches. Negative for weakness.  Hematological: Negative for adenopathy.     Physical Exam Triage  Vital Signs ED Triage Vitals  Enc Vitals Group     BP 12/08/20 1834 (!) 127/97     Pulse Rate 12/08/20 1834 77     Resp 12/08/20 1834 18     Temp 12/08/20 1834 98.6 F (37 C)     Temp Source 12/08/20 1834 Oral     SpO2 12/08/20 1834 96 %     Weight --      Height --      Head Circumference --      Peak Flow --      Pain Score 12/08/20 1832 0     Pain Loc --      Pain Edu? --      Excl. in [place]? --    No data found.  Updated Vital Signs BP (!) 127/97 (BP Location: Right Arm)   Pulse 77   Temp 98.6 F (37 C) (Oral)   Resp 18   LMP  (Within Months) Comment: 1 month  SpO2 96%   Visual Acuity Right Eye Distance:   Left Eye Distance:   Bilateral Distance:    Right Eye Near:   Left Eye Near:    Bilateral Near:     Physical Exam Physical Exam Vitals signs and nursing note reviewed.  Constitutional:      General: She is not in acute distress.    Appearance: Normal appearance. She is not ill-appearing, toxic-appearing or diaphoretic.  HENT:     Head: Normocephalic.     Right Ear: Tympanic membrane, ear canal and external ear normal.     Left Ear: Tympanic membrane, ear canal and external ear normal.     Nose: Nose normal.     Mouth/Throat:     Mouth: Mucous membranes are moist.  Eyes:     General: No scleral icterus.       Right eye: No discharge.        Left eye: No discharge.     Conjunctiva/sclera: Conjunctivae normal.  Neck:     Musculoskeletal: Neck supple. No neck rigidity.  Cardiovascular:     Rate and Rhythm: Normal rate and regular rhythm.     Heart sounds: No murmur.  Pulmonary:     Effort: Pulmonary effort is normal.     Breath sounds: Normal breath sounds.  .  Musculoskeletal: Normal range of motion.  Lymphadenopathy:     Cervical: No cervical adenopathy.  Skin:    General: Skin is warm and dry.     Coloration: Skin is not jaundiced.     Findings: No rash.  Neurological:     Mental Status: She is alert and oriented to person, place, and time.      Gait: Gait normal.  Psychiatric:        Mood and Affect: Mood normal.        Behavior: Behavior normal.  Thought Content: Thought content normal.        Judgment: Judgment normal.    UC Treatments / Results  Labs (all labs ordered are listed, but only abnormal results are displayed) Labs Reviewed  RESP PANEL BY RT-PCR (FLU A&B, COVID) [id number]    EKG   Radiology DG Chest 2 View  Result Date: 12/08/2020 CLINICAL DATA:  Cough and sweats EXAM: CHEST - 2 VIEW COMPARISON:  None. FINDINGS: The heart size and mediastinal contours are within normal limits. Both lungs are clear. The visualized skeletal structures are unremarkable. IMPRESSION: No active cardiopulmonary disease. Electronically Signed   By: Jonna Clark M.D.   On: 12/08/2020 19:09    Procedures Procedures (including critical care time)  Medications Ordered in UC Medications - No data to display  Initial Impression / Assessment and Plan / UC Course  I have reviewed the triage vital signs and the nursing notes. Covid and flu test pending for 3 days. In then mean time I placed her on Tamiflu, prednisone and albuterol inhaler as noted.  Pertinent  imaging results that were available during my care of the patient were reviewed by me and considered in my medical decision making (see chart for details).  Final Clinical Impressions(s) / UC Diagnoses   Final diagnoses:  Flu-like symptoms  Close exposure to COVID-19 virus     Discharge Instructions     Due to your chronic conditions and if your covid test is positive, you qualify to receive Monoclonal Antibody infusion and if you don't hear from anyone within 24 hours of finding your results, call [phone]  Since we wont get back the covid or flu test for 2-3 days, I will go ahead and placed you on Tamiflu to cover influenza.  You may take the following supplements to help your immune system be stronger to fight this viral infection Take Quarcetin 500 mg three  times a day x 7 days with Zinc 50 mg ones a day x 7 days. The quarcetin is an antiviral and anti-inflammatory supplement which helps open the zinc channels in the cell to absorb Zinc. Zinc helps decrease the virus load in your body. Take Melatonin 6-10 mg at bed time which also helps support your immune system.  Also make sure to take Vit D 5,000 IU per day with a fatty meal and Vit C 5000 mg a day until you are completely better. To prevent viral illnesses your vitamin D should be between 60-80. Stay on Vitamin D 2,000  and C  1000 mg the rest of the season.  Don't lay around, keep active and walk as much as you are able to to prevent worsening of your symptoms.  Follow up with your family Dr next week.  If you get short of breath and you are able to check  your oxygen with a pulse oxygen meter, if it gets to 92% or less, you need to go to the hospital to be admitted. If you dont have one, come back here and we will assess you.      ED Prescriptions    Medication Sig Dispense Auth. Provider   predniSONE (DELTASONE) 50 MG tablet One qd 5 tablet Rodriguez-Southworth, Shyrl Obi, PA-C   albuterol (VENTOLIN HFA) 108 (90 Base) MCG/ACT inhaler Inhale 2 puffs into the lungs every 4 (four) hours as needed for wheezing or shortness of breath. 18 g Rodriguez-Southworth, Nettie Elm, PA-C   oseltamivir (TAMIFLU) 75 MG capsule Take 1 capsule (75 mg total) by mouth every 12 (twelve)  hours. 10 capsule Rodriguez-Southworth, Ashad Fawbush, PA-C   HYDROcodone-homatropine (HYCODAN) 5-1.5 MG/5ML syrup Take 5 mLs by mouth at bedtime. Prn cough 120 mL Rodriguez-Southworth, Sunday Spillers, PA-C     I have reviewed the PDMP during this encounter.   Shelby Mattocks, Hershal Coria 12/08/20 2027

## 2020-12-08 NOTE — Discharge Instructions (Addendum)
Due to your chronic conditions and if your covid test is positive, you qualify to receive Monoclonal Antibody infusion and if you don't hear from anyone within 24 hours of finding your results, call [phone]  Since we wont get back the covid or flu test for 2-3 days, I will go ahead and placed you on Tamiflu to cover influenza.  You may take the following supplements to help your immune system be stronger to fight this viral infection Take Quarcetin 500 mg three times a day x 7 days with Zinc 50 mg ones a day x 7 days. The quarcetin is an antiviral and anti-inflammatory supplement which helps open the zinc channels in the cell to absorb Zinc. Zinc helps decrease the virus load in your body. Take Melatonin 6-10 mg at bed time which also helps support your immune system.  Also make sure to take Vit D 5,000 IU per day with a fatty meal and Vit C 5000 mg a day until you are completely better. To prevent viral illnesses your vitamin D should be between 60-80. Stay on Vitamin D 2,000  and C  1000 mg the rest of the season.  Don't lay around, keep active and walk as much as you are able to to prevent worsening of your symptoms.  Follow up with your family Dr next week.  If you get short of breath and you are able to check  your oxygen with a pulse oxygen meter, if it gets to 92% or less, you need to go to the hospital to be admitted. If you dont have one, come back here and we will assess you.

## 2020-12-08 NOTE — ED Triage Notes (Signed)
Pt presents with cough, chills and chest congestion x 4 days. Reports she needs to change clothes at night because of the sweating. Reports her fiancee tested positive for COVID 3 days ago.

## 2021-02-06 ENCOUNTER — Other Ambulatory Visit: Payer: Self-pay | Admitting: Cardiology

## 2021-02-09 ENCOUNTER — Other Ambulatory Visit: Payer: Self-pay | Admitting: Cardiology

## 2021-02-28 ENCOUNTER — Other Ambulatory Visit: Payer: Self-pay | Admitting: Cardiology

## 2021-03-05 ENCOUNTER — Other Ambulatory Visit: Payer: Self-pay | Admitting: Cardiology

## 2021-04-02 ENCOUNTER — Other Ambulatory Visit: Payer: Self-pay

## 2021-04-02 MED ORDER — SPIRONOLACTONE 100 MG PO TABS: 50.0000 mg | ORAL_TABLET | Freq: Every day | ORAL | 0 refills | Status: DC

## 2021-04-02 MED ORDER — CARVEDILOL 25 MG PO TABS: 25.0000 mg | ORAL_TABLET | Freq: Two times a day (BID) | ORAL | 0 refills | Status: DC

## 2021-04-02 NOTE — Telephone Encounter (Signed)
Pt's medication was sent to pt's pharmacy as requested. Confirmation received.  °

## 2021-04-16 ENCOUNTER — Telehealth: Payer: Self-pay | Admitting: Internal Medicine

## 2021-04-16 DIAGNOSIS — Z201 Contact with and (suspected) exposure to tuberculosis: Secondary | ICD-10-CM | POA: Insufficient documentation

## 2021-04-16 NOTE — Telephone Encounter (Signed)
Patient called and said that her sgo found out that he has TB she was wondering what she needed to do to get tested. She can be reached at [phone]. Her last OV was 09-15-18.   Please advise

## 2021-04-16 NOTE — Telephone Encounter (Signed)
Find out if she has any active symptoms she can be seen.

## 2021-04-16 NOTE — Telephone Encounter (Signed)
Spoke with patient today. Currently no active symptoms.  She has always had SOB that has been on-going.  No bloody mucus or sputum. Weight loss but due to working for the [employer]. (Walk at least 10,000 steps daily)  Made appointment for her to come in on tomorrow.

## 2021-04-16 NOTE — Progress Notes (Deleted)
Subjective:    Patient ID: Debra Wade, female    DOB: 1971/01/04, 50 y.o.   MRN: [id number]  HPI The patient is here for an acute visit.     Medications and allergies reviewed with patient and updated if appropriate.  Patient Active Problem List   Diagnosis Date Noted  . Orthostatic hypotension 01/09/2019  . Viral gastroenteritis 11/16/2018  . Tongue abnormality 09/15/2018  . Tobacco abuse 07/06/2017  . Essential hypertension 06/09/2017  . Fatigue 06/09/2017  . Snoring 06/09/2017    Current Outpatient Medications on File Prior to Visit  Medication Sig Dispense Refill  . albuterol (VENTOLIN HFA) 108 (90 Base) MCG/ACT inhaler Inhale 2 puffs into the lungs every 4 (four) hours as needed for wheezing or shortness of breath. 18 g 0  . Ascorbic Acid (VITAMIN C GUMMIE PO) Take by mouth.    . carvedilol (COREG) 25 MG tablet Take 1 tablet (25 mg total) by mouth 2 (two) times daily. Please keep upcoming appt in June 2022 with Dr. Radford Pax before anymore refills. Thank you Final Attempt 180 tablet 0  . Cyanocobalamin (VITAMIN B-12 PO) Take by mouth.    Marland Kitchen HYDROcodone-homatropine (HYCODAN) 5-1.5 MG/5ML syrup Take 5 mLs by mouth at bedtime. Prn cough 120 mL 0  . Omega-3 Fatty Acids (FISH OIL) 500 MG CAPS Take 500 mg by mouth.    . oseltamivir (TAMIFLU) 75 MG capsule Take 1 capsule (75 mg total) by mouth every 12 (twelve) hours. 10 capsule 0  . pantoprazole (PROTONIX) 40 MG tablet TAKE 1 TABLET BY MOUTH EVERY DAY 30 tablet 1  . predniSONE (DELTASONE) 50 MG tablet One qd 5 tablet 0  . spironolactone (ALDACTONE) 100 MG tablet Take 0.5 tablets (50 mg total) by mouth daily. Please keep upcoming appt with Dr. Radford Pax June 2022 before anymore refills. Thank you Final Attempt 45 tablet 0   No current facility-administered medications on file prior to visit.    Past Medical History:  Diagnosis Date  . Arthritis   . Chicken pox   . Frequent headaches   . GERD (gastroesophageal reflux disease)    . Hypertension   . Stroke ([place])   . Syncope     Past Surgical History:  Procedure Laterality Date  . TUBAL LIGATION      Social History   Socioeconomic History  . Marital status: Single    Spouse name: Not on file  . Number of children: 5  . Years of education: 38  . Highest education level: Not on file  Occupational History  . Occupation: [employer]  . Smoking status: Current Every Day Smoker    Packs/day: 0.30    Years: 24.00    Pack years: 7.20    Types: Cigarettes  . Smokeless tobacco: Never Used  . Tobacco comment: about 8 cigarettes per day  Vaping Use  . Vaping Use: Never used  Substance and Sexual Activity  . Alcohol use: Yes    Comment: occ.   . Drug use: No  . Sexual activity: Not on file  Other Topics Concern  . Not on file  Social History Narrative   Fun/Hobby: [place], anything relaxing   Denies abuse and feels safe at home.   Social Determinants of Health   Financial Resource Strain: Not on file  Food Insecurity: Not on file  Transportation Needs: Not on file  Physical Activity: Not on file  Stress: Not on file  Social Connections: Not on file  Family History  Problem Relation Age of Onset  . Hypertension Mother   . Hypertension Father   . Heart disease Father   . Breast cancer Maternal Grandmother   . Prostate cancer Paternal Grandfather     Review of Systems     Objective:  There were no vitals filed for this visit. BP Readings from Last 3 Encounters:  12/08/20 (!) 127/97  03/29/20 (!) 122/102  01/20/20 (!) 125/92   Wt Readings from Last 3 Encounters:  01/20/20 185 lb (83.9 kg)  11/30/19 188 lb 9.6 oz (85.5 kg)  09/02/19 188 lb (85.3 kg)   There is no height or weight on file to calculate BMI.   Physical Exam         Assessment & Plan:    See Problem List for Assessment and Plan of chronic medical problems.    This visit occurred during the SARS-CoV-2 public health emergency.  Safety protocols were  in place, including screening questions prior to the visit, additional usage of staff PPE, and extensive cleaning of exam room while observing appropriate contact time as indicated for disinfecting solutions.

## 2021-04-17 ENCOUNTER — Ambulatory Visit: Payer: 59 | Admitting: Internal Medicine

## 2021-04-17 DIAGNOSIS — Z201 Contact with and (suspected) exposure to tuberculosis: Secondary | ICD-10-CM

## 2021-04-23 ENCOUNTER — Encounter: Payer: Self-pay | Admitting: Internal Medicine

## 2021-04-23 DIAGNOSIS — K219 Gastro-esophageal reflux disease without esophagitis: Secondary | ICD-10-CM | POA: Insufficient documentation

## 2021-04-23 NOTE — Patient Instructions (Addendum)
Blood work was ordered.  You had TB skin test today.     Medications changes include :   none   A referral was ordered for gyn and GI.        Someone from their office will call you to schedule an appointment.    Please followup in 1 year    Health Maintenance, Female Adopting a healthy lifestyle and getting preventive care are important in promoting health and wellness. Ask your health care provider about:  The right schedule for you to have regular tests and exams.  Things you can do on your own to prevent diseases and keep yourself healthy. What should I know about diet, weight, and exercise? Eat a healthy diet  Eat a diet that includes plenty of vegetables, fruits, low-fat dairy products, and lean protein.  Do not eat a lot of foods that are high in solid fats, added sugars, or sodium.   Maintain a healthy weight Body mass index (BMI) is used to identify weight problems. It estimates body fat based on height and weight. Your health care provider can help determine your BMI and help you achieve or maintain a healthy weight. Get regular exercise Get regular exercise. This is one of the most important things you can do for your health. Most adults should:  Exercise for at least 150 minutes each week. The exercise should increase your heart rate and make you sweat (moderate-intensity exercise).  Do strengthening exercises at least twice a week. This is in addition to the moderate-intensity exercise.  Spend less time sitting. Even light physical activity can be beneficial. Watch cholesterol and blood lipids Have your blood tested for lipids and cholesterol at 48 years of age, then have this test every 5 years. Have your cholesterol levels checked more often if:  Your lipid or cholesterol levels are high.  You are older than 48 years of age.  You are at high risk for heart disease. What should I know about cancer screening? Depending on your health history and family  history, you may need to have cancer screening at various ages. This may include screening for:  Breast cancer.  Cervical cancer.  Colorectal cancer.  Skin cancer.  Lung cancer. What should I know about heart disease, diabetes, and high blood pressure? Blood pressure and heart disease  High blood pressure causes heart disease and increases the risk of stroke. This is more likely to develop in people who have high blood pressure readings, are of African descent, or are overweight.  Have your blood pressure checked: ? Every 3-5 years if you are 47-45 years of age. ? Every year if you are 83 years old or older. Diabetes Have regular diabetes screenings. This checks your fasting blood sugar level. Have the screening done:  Once every three years after age 49 if you are at a normal weight and have a low risk for diabetes.  More often and at a younger age if you are overweight or have a high risk for diabetes. What should I know about preventing infection? Hepatitis B If you have a higher risk for hepatitis B, you should be screened for this virus. Talk with your health care provider to find out if you are at risk for hepatitis B infection. Hepatitis C Testing is recommended for:  Everyone born from [birth year] through 1963.  Anyone with known risk factors for hepatitis C. Sexually transmitted infections (STIs)  Get screened for STIs, including gonorrhea and chlamydia, if: ? You are sexually  active and are younger than 48 years of age. ? You are older than 48 years of age and your health care provider tells you that you are at risk for this type of infection. ? Your sexual activity has changed since you were last screened, and you are at increased risk for chlamydia or gonorrhea. Ask your health care provider if you are at risk.  Ask your health care provider about whether you are at high risk for HIV. Your health care provider may recommend a prescription medicine to help prevent HIV  infection. If you choose to take medicine to prevent HIV, you should first get tested for HIV. You should then be tested every 3 months for as long as you are taking the medicine. Pregnancy  If you are about to stop having your period (premenopausal) and you may become pregnant, seek counseling before you get pregnant.  Take 400 to 800 micrograms (mcg) of folic acid every day if you become pregnant.  Ask for birth control (contraception) if you want to prevent pregnancy. Osteoporosis and menopause Osteoporosis is a disease in which the bones lose minerals and strength with aging. This can result in bone fractures. If you are 65 years old or older, or if you are at risk for osteoporosis and fractures, ask your health care provider if you should:  Be screened for bone loss.  Take a calcium or vitamin D supplement to lower your risk of fractures.  Be given hormone replacement therapy (HRT) to treat symptoms of menopause. Follow these instructions at home: Lifestyle  Do not use any products that contain nicotine or tobacco, such as cigarettes, e-cigarettes, and chewing tobacco. If you need help quitting, ask your health care provider.  Do not use street drugs.  Do not share needles.  Ask your health care provider for help if you need support or information about quitting drugs. Alcohol use  Do not drink alcohol if: ? Your health care provider tells you not to drink. ? You are pregnant, may be pregnant, or are planning to become pregnant.  If you drink alcohol: ? Limit how much you use to 0-1 drink a day. ? Limit intake if you are breastfeeding.  Be aware of how much alcohol is in your drink. In the [place] one drink equals one 12 oz bottle of beer (355 mL), one 5 oz glass of wine (148 mL), or one 1 oz glass of hard liquor (44 mL). General instructions  Schedule regular health, dental, and eye exams.  Stay current with your vaccines.  Tell your health care provider if: ? You  often feel depressed. ? You have ever been abused or do not feel safe at home. Summary  Adopting a healthy lifestyle and getting preventive care are important in promoting health and wellness.  Follow your health care provider's instructions about healthy diet, exercising, and getting tested or screened for diseases.  Follow your health care provider's instructions on monitoring your cholesterol and blood pressure. This information is not intended to replace advice given to you by your health care provider. Make sure you discuss any questions you have with your health care provider. Document Revised: 11/17/2018 Document Reviewed: 11/17/2018 Elsevier Patient Education  2021 [employer].

## 2021-04-23 NOTE — Progress Notes (Signed)
Subjective:    Patient ID: Debra Wade, female    DOB: 03-Jun-1971, 50 y.o.   MRN: [id number]   This visit occurred during the SARS-CoV-2 public health emergency.  Safety protocols were in place, including screening questions prior to the visit, additional usage of staff PPE, and extensive cleaning of exam room while observing appropriate contact time as indicated for disinfecting solutions.    HPI She is here for a physical exam.   She has night sweats.  She sometimes has SOB when laying down, but not so much when she is active.  She will lay down and feel like she can not breath and have to sit up.  She is following with cardiology.  She is working on smoking cessation.  Overall she feels well.  Medications and allergies reviewed with patient and updated if appropriate.  Patient Active Problem List   Diagnosis Date Noted  . GERD (gastroesophageal reflux disease) 04/23/2021  . Exposure to TB 04/16/2021  . Orthostatic hypotension 01/09/2019  . Viral gastroenteritis 11/16/2018  . Tongue abnormality 09/15/2018  . Tobacco abuse 07/06/2017  . Essential hypertension 06/09/2017  . Fatigue 06/09/2017  . Snoring 06/09/2017    Current Outpatient Medications on File Prior to Visit  Medication Sig Dispense Refill  . albuterol (VENTOLIN HFA) 108 (90 Base) MCG/ACT inhaler Inhale 2 puffs into the lungs every 4 (four) hours as needed for wheezing or shortness of breath. 18 g 0  . carvedilol (COREG) 25 MG tablet Take 1 tablet (25 mg total) by mouth 2 (two) times daily. Please keep upcoming appt in June 2022 with Dr. Radford Pax before anymore refills. Thank you Final Attempt 180 tablet 0  . Cyanocobalamin (VITAMIN B-12 PO) Take by mouth.    . pantoprazole (PROTONIX) 40 MG tablet TAKE 1 TABLET BY MOUTH EVERY DAY 30 tablet 1  . spironolactone (ALDACTONE) 100 MG tablet Take 0.5 tablets (50 mg total) by mouth daily. Please keep upcoming appt with Dr. Radford Pax June 2022 before anymore refills. Thank  you Final Attempt 45 tablet 0   No current facility-administered medications on file prior to visit.    Past Medical History:  Diagnosis Date  . Arthritis   . Chicken pox   . Frequent headaches   . GERD (gastroesophageal reflux disease)   . Hypertension   . Stroke ([place])   . Syncope     Past Surgical History:  Procedure Laterality Date  . TUBAL LIGATION      Social History   Socioeconomic History  . Marital status: Single    Spouse name: Not on file  . Number of children: 5  . Years of education: 63  . Highest education level: Not on file  Occupational History  . Occupation: [employer]  . Smoking status: Current Every Day Smoker    Packs/day: 0.30    Years: 24.00    Pack years: 7.20    Types: Cigarettes  . Smokeless tobacco: Never Used  . Tobacco comment: about 8 cigarettes per day  Vaping Use  . Vaping Use: Never used  Substance and Sexual Activity  . Alcohol use: Yes    Comment: occ.   . Drug use: No  . Sexual activity: Not on file  Other Topics Concern  . Not on file  Social History Narrative   Fun/Hobby: [place], anything relaxing   Denies abuse and feels safe at home.   Social Determinants of Health   Financial Resource Strain: Not on file  Food Insecurity: Not on file  Transportation Needs: Not on file  Physical Activity: Not on file  Stress: Not on file  Social Connections: Not on file    Family History  Problem Relation Age of Onset  . Hypertension Mother   . Hypertension Father   . Heart disease Father   . Breast cancer Maternal Grandmother   . Prostate cancer Paternal Grandfather     Review of Systems  Constitutional: Negative for chills and fever.  HENT: Positive for congestion (in morning).   Eyes: Negative for visual disturbance.  Respiratory: Positive for shortness of breath (when laying down - needs to take a few deep breaths - no DOE). Negative for cough and wheezing.   Cardiovascular: Positive for palpitations (with  SOB). Negative for chest pain.  Gastrointestinal: Negative for abdominal pain, blood in stool, constipation, diarrhea and nausea.       Abd cramping and diarrhea with lactose  Genitourinary: Negative for dysuria.  Musculoskeletal: Positive for arthralgias (knees). Negative for back pain.  Skin: Negative for rash.  Neurological: Positive for headaches (occ sinus). Negative for light-headedness.  Psychiatric/Behavioral: Negative for dysphoric mood. The patient is not nervous/anxious.        Objective:   Vitals:   04/24/21 0943  BP: 140/86  Pulse: 79  Temp: 98.6 F (37 C)  SpO2: 99%   Filed Weights   04/24/21 0943  Weight: 183 lb (83 kg)   Body mass index is 26.26 kg/m.  BP Readings from Last 3 Encounters:  04/24/21 140/86  12/08/20 (!) 127/97  03/29/20 (!) 122/102    Wt Readings from Last 3 Encounters:  04/24/21 183 lb (83 kg)  01/20/20 185 lb (83.9 kg)  11/30/19 188 lb 9.6 oz (85.5 kg)    Depression screen [facility] 2/9 04/24/2021 02/18/2018 11/24/2017  Decreased Interest 0 1 2  Down, Depressed, Hopeless 0 0 1  PHQ - 2 Score 0 1 3  Altered sleeping 0 0 2  Tired, decreased energy 1 2 2   Change in appetite 0 1 0  Feeling bad or failure about yourself  0 0 0  Trouble concentrating 0 0 0  Moving slowly or fidgety/restless 0 1 0  Suicidal thoughts 0 0 0  PHQ-9 Score 1 5 7   Difficult doing work/chores Not difficult at all - -    GAD 7 : Generalized Anxiety Score 04/24/2021 02/18/2018 11/24/2017  Nervous, Anxious, on Edge 0 0 0  Control/stop worrying 0 1 2  Worry too much - different things 0 1 2  Trouble relaxing 0 0 2  Restless 0 0 0  Easily annoyed or irritable 1 1 2   Afraid - awful might happen 0 0 0  Total GAD 7 Score 1 3 8   Anxiety Difficulty Not difficult at all - -       Physical Exam Constitutional: She appears well-developed and well-nourished. No distress.  HENT:  Head: Normocephalic and atraumatic.  Right Ear: External ear normal. Normal ear canal  and TM Left Ear: External ear normal.  Normal ear canal and TM Mouth/Throat: Oropharynx is clear and moist.  Eyes: Conjunctivae and EOM are normal.  Neck: Neck supple. No tracheal deviation present. No thyromegaly present.  No carotid bruit  Cardiovascular: Normal rate, regular rhythm and normal heart sounds.   No murmur heard.  No edema. Pulmonary/Chest: Effort normal and breath sounds normal. No respiratory distress. She has no wheezes. She has no rales.  Breast: deferred   Abdominal: Soft. She exhibits no distension.  There is no tenderness.  Lymphadenopathy: She has no cervical adenopathy.  Skin: Skin is warm and dry. She is not diaphoretic.  Psychiatric: She has a normal mood and affect. Her behavior is normal.        Assessment & Plan:   Physical exam: Screening blood work    ordered Immunizations  tdap due-did not get it today-will try to give Friday when she has her PPD read, others up to date Colonoscopy  Discussed  Mammogram  Due - referred to gyn Gyn   Not up to date - will refer Exercise  Yoga, walks a lot at work   Weight  Has lost weight Substance abuse  Smoking - working on smoking cessation   Screened for depression using the PHQ 9 scale.  No evidence of depression.   Screened for anxiety using GAD7 Scale.  No evidence of anxiety.    See Problem List for Assessment and Plan of chronic medical problems.

## 2021-04-24 ENCOUNTER — Other Ambulatory Visit: Payer: Self-pay

## 2021-04-24 ENCOUNTER — Ambulatory Visit (INDEPENDENT_AMBULATORY_CARE_PROVIDER_SITE_OTHER): Payer: 59 | Admitting: Internal Medicine

## 2021-04-24 VITALS — BP 140/86 | HR 79 | Temp 98.6°F | Ht 70.0 in | Wt 183.0 lb

## 2021-04-24 DIAGNOSIS — K219 Gastro-esophageal reflux disease without esophagitis: Secondary | ICD-10-CM | POA: Diagnosis not present

## 2021-04-24 DIAGNOSIS — Z1211 Encounter for screening for malignant neoplasm of colon: Secondary | ICD-10-CM | POA: Diagnosis not present

## 2021-04-24 DIAGNOSIS — Z124 Encounter for screening for malignant neoplasm of cervix: Secondary | ICD-10-CM | POA: Diagnosis not present

## 2021-04-24 DIAGNOSIS — Z720 Tobacco use: Secondary | ICD-10-CM

## 2021-04-24 DIAGNOSIS — I10 Essential (primary) hypertension: Secondary | ICD-10-CM

## 2021-04-24 DIAGNOSIS — Z111 Encounter for screening for respiratory tuberculosis: Secondary | ICD-10-CM

## 2021-04-24 DIAGNOSIS — Z0000 Encounter for general adult medical examination without abnormal findings: Secondary | ICD-10-CM | POA: Diagnosis not present

## 2021-04-24 DIAGNOSIS — R739 Hyperglycemia, unspecified: Secondary | ICD-10-CM | POA: Diagnosis not present

## 2021-04-24 DIAGNOSIS — F17210 Nicotine dependence, cigarettes, uncomplicated: Secondary | ICD-10-CM

## 2021-04-24 DIAGNOSIS — Z201 Contact with and (suspected) exposure to tuberculosis: Secondary | ICD-10-CM

## 2021-04-24 LAB — LIPID PANEL
Cholesterol: 220 mg/dL — ABNORMAL HIGH (ref 0–200)
HDL: 45.3 mg/dL (ref >39.00)
LDL Cholesterol: 151 mg/dL — ABNORMAL HIGH (ref 0–99)
NonHDL: 174.58
Total CHOL/HDL Ratio: 5
Triglycerides: 117 mg/dL (ref 0.0–149.0)
VLDL: 23.4 mg/dL (ref 0.0–40.0)

## 2021-04-24 LAB — COMPREHENSIVE METABOLIC PANEL
ALT: 13 U/L (ref 0–35)
AST: 17 U/L (ref 0–37)
Albumin: 4.6 g/dL (ref 3.5–5.2)
Alkaline Phosphatase: 36 U/L — ABNORMAL LOW (ref 39–117)
BUN: 13 mg/dL (ref 6–23)
CO2: 28 mEq/L (ref 19–32)
Calcium: 9.9 mg/dL (ref 8.4–10.5)
Chloride: 105 mEq/L (ref 96–112)
Creatinine, Ser: 1.03 mg/dL (ref 0.40–1.20)
GFR: 64.55 mL/min (ref >60.00)
Glucose, Bld: 90 mg/dL (ref 70–99)
Potassium: 4.4 mEq/L (ref 3.5–5.1)
Sodium: 139 mEq/L (ref 135–145)
Total Bilirubin: 0.5 mg/dL (ref 0.2–1.2)
Total Protein: 7.5 g/dL (ref 6.0–8.3)

## 2021-04-24 LAB — CBC WITH DIFFERENTIAL/PLATELET
Basophils Absolute: 0.1 10*3/uL (ref 0.0–0.1)
Basophils Relative: 1 % (ref 0.0–3.0)
Eosinophils Absolute: 0.2 10*3/uL (ref 0.0–0.7)
Eosinophils Relative: 2.7 % (ref 0.0–5.0)
HCT: 42.2 % (ref 36.0–46.0)
Hemoglobin: 14.4 g/dL (ref 12.0–15.0)
Lymphocytes Relative: 54.5 % — ABNORMAL HIGH (ref 12.0–46.0)
Lymphs Abs: 3.9 10*3/uL (ref 0.7–4.0)
MCHC: 34.2 g/dL (ref 30.0–36.0)
MCV: 97.7 fl (ref 78.0–100.0)
Monocytes Absolute: 0.5 10*3/uL (ref 0.1–1.0)
Monocytes Relative: 6.7 % (ref 3.0–12.0)
Neutro Abs: 2.5 10*3/uL (ref 1.4–7.7)
Neutrophils Relative %: 35.1 % — ABNORMAL LOW (ref 43.0–77.0)
Platelets: 243 10*3/uL (ref 150.0–400.0)
RBC: 4.32 Mil/uL (ref 3.87–5.11)
RDW: 13.6 % (ref 11.5–15.5)
WBC: 7.2 10*3/uL (ref 4.0–10.5)

## 2021-04-24 LAB — HEMOGLOBIN A1C: Hgb A1c MFr Bld: 6 % (ref 4.6–6.5)

## 2021-04-24 LAB — TSH: TSH: 1.39 u[IU]/mL (ref 0.35–4.50)

## 2021-04-24 NOTE — Assessment & Plan Note (Signed)
Chronic °Management per cardiology °

## 2021-04-24 NOTE — Assessment & Plan Note (Signed)
Chronic GERD fairly controlled with pantoprazole 40 mg daily-continue

## 2021-04-24 NOTE — Assessment & Plan Note (Signed)
Acute Her fianc had a positive PPD test the other day.  He does not have active TB She is asymptomatic We will do PPD test today and have her follow-up in 2 days

## 2021-04-24 NOTE — Assessment & Plan Note (Signed)
Smoking cessation was discussed for more than 3 minutes.  The patient was counseled on the dangers of tobacco use, and was advised to quit and She wants to quit..  Reviewed ways of quitting smoking including nicotine replacement, vapping/e-cigarettes, cold [place], weaning off cigarettes, and pharmacotherapy (wellbutrin and chantix).  She did try nicotine patches and gum with cardiology, but this elevated her blood pressure significantly and she cannot use them.  Chantix currently recalled.  Discussed Wellbutrin, but that makes her nervous because of days used for depression and she is concerned about side effects.  Discussed slowly weaning off 1 cigarette at a time.  Discussed breaking the habit by smoking her cigarettes at different times but smoking the same amount.

## 2021-04-24 NOTE — Assessment & Plan Note (Signed)
Chronic Check A1c 

## 2021-04-25 ENCOUNTER — Telehealth: Payer: Self-pay | Admitting: Internal Medicine

## 2021-04-25 NOTE — Telephone Encounter (Signed)
Patient calling, would like to discuss her lab results from yesterday.

## 2021-04-26 ENCOUNTER — Ambulatory Visit (INDEPENDENT_AMBULATORY_CARE_PROVIDER_SITE_OTHER): Payer: 59

## 2021-04-26 DIAGNOSIS — Z23 Encounter for immunization: Secondary | ICD-10-CM

## 2021-04-26 LAB — TB SKIN TEST
Induration: 0 mm
TB Skin Test: NEGATIVE

## 2021-04-26 NOTE — Telephone Encounter (Signed)
Patient seen in office today.  Print out of labs given with Dr.Burns intake on labs.

## 2021-05-09 ENCOUNTER — Encounter: Payer: Self-pay | Admitting: Cardiology

## 2021-05-09 ENCOUNTER — Other Ambulatory Visit: Payer: Self-pay

## 2021-05-09 ENCOUNTER — Ambulatory Visit (INDEPENDENT_AMBULATORY_CARE_PROVIDER_SITE_OTHER): Payer: 59 | Admitting: Cardiology

## 2021-05-09 VITALS — BP 128/94 | HR 77 | Ht 69.5 in | Wt 184.6 lb

## 2021-05-09 DIAGNOSIS — I10 Essential (primary) hypertension: Secondary | ICD-10-CM | POA: Diagnosis not present

## 2021-05-09 DIAGNOSIS — R0602 Shortness of breath: Secondary | ICD-10-CM

## 2021-05-09 DIAGNOSIS — I951 Orthostatic hypotension: Secondary | ICD-10-CM

## 2021-05-09 MED ORDER — CARVEDILOL 25 MG PO TABS: 25.0000 mg | ORAL_TABLET | Freq: Two times a day (BID) | ORAL | 3 refills | Status: DC

## 2021-05-09 MED ORDER — SPIRONOLACTONE 100 MG PO TABS: 50.0000 mg | ORAL_TABLET | Freq: Every day | ORAL | 3 refills | Status: DC

## 2021-05-09 MED ORDER — PANTOPRAZOLE SODIUM 40 MG PO TBEC: 1.0000 | DELAYED_RELEASE_TABLET | Freq: Every day | ORAL | 3 refills | Status: DC

## 2021-05-09 NOTE — Patient Instructions (Signed)
Medication Instructions:  Your physician recommends that you continue on your current medications as directed. Please refer to the Current Medication list given to you today.  *If you need a refill on your cardiac medications before your next appointment, please call your pharmacy*   Lab Work: None If you have labs (blood work) drawn today and your tests are completely normal, you will receive your results only by: Marland Kitchen MyChart Message (if you have MyChart) OR . A paper copy in the mail If you have any lab test that is abnormal or we need to change your treatment, we will call you to review the results.   Testing/Procedures: None   Follow-Up: At [facility], you and your health needs are our priority.  As part of our continuing mission to provide you with exceptional heart care, we have created designated Provider Care Teams.  These Care Teams include your primary Cardiologist (physician) and Advanced Practice Providers (APPs -  Physician Assistants and Nurse Practitioners) who all work together to provide you with the care you need, when you need it.  We recommend signing up for the patient portal called "MyChart".  Sign up information is provided on this After Visit Summary.  MyChart is used to connect with patients for Virtual Visits (Telemedicine).  Patients are able to view lab/test results, encounter notes, upcoming appointments, etc.  Non-urgent messages can be sent to your provider as well.   To learn more about what you can do with MyChart, go to [email or web address].    Your next appointment:   1 year(s)  The format for your next appointment:   In Person  Provider:   You may see Fransico Him, MD or one of the following Advanced Practice Providers on your designated Care Team:    Melina Copa, PA-C  Ermalinda Barrios, PA-C    Other Instructions

## 2021-05-09 NOTE — Progress Notes (Signed)
Date:  03/29/2020   ID:  ZARAY GATCHEL, DOB 10-15-1971, MRN [id number]   Date:  05/09/2021   ID:  Alanson Aly, DOB 1971-08-28, MRN [id number]  PCP:  Binnie Rail, MD  Cardiologist:  Fransico Him, MD    Referring MD: Binnie Rail, MD   Chief Complaint  Patient presents with  . Follow-up    HTN, orthostatic hypotension and SOB    History of Present Illness:    Debra Wade is a 50 y.o. female with a hx of HAs, HTN and CVA. She has not tolerated amlodipine due to LE edema and had syncope and orthostatic hypotension after taking metoprolol and Losartan HCT. She also has a history of preeclampsia. She was encouraged to use compression hose when standing for long periods of time at work. She was seen back several times in HTN clinic and Carvedilol titrated for BP goal <130/70mmHg. She was started on spironolactone 25mg  daily and subsequently increased to 50mg  daily. Carvedilol eventually increased to 25mg  BID.   I saw her in Feb 2021 and she was complaining of SOB worse when laying down.   She was having to clear her throat a lot and was placed on Protonix for possible GERD.  2D echo was normal as well as labs including BNP.  Her sx resolved on PPI.  She is here today for followup and is doing well.  She denies any chest pain or pressure, SOB, DOE, PND, orthopnea, LE edema, dizziness, palpitations or syncope. She does over 10K steps at night.  She is compliant with her meds and is tolerating meds with no SE.    Past Medical History:  Diagnosis Date  . Arthritis   . Chicken pox   . Frequent headaches   . GERD (gastroesophageal reflux disease)   . Hypertension   . Stroke ([place])   . Syncope     Past Surgical History:  Procedure Laterality Date  . TUBAL LIGATION      Current Medications: Current Meds  Medication Sig  . albuterol (VENTOLIN HFA) 108 (90 Base) MCG/ACT inhaler Inhale 2 puffs into the lungs every 4 (four) hours as needed for wheezing or shortness of  breath.  . Cyanocobalamin (VITAMIN B-12 PO) Take by mouth.  . [DISCONTINUED] carvedilol (COREG) 25 MG tablet Take 1 tablet (25 mg total) by mouth 2 (two) times daily. Please keep upcoming appt in June 2022 with Dr. Radford Pax before anymore refills. Thank you Final Attempt  . [DISCONTINUED] pantoprazole (PROTONIX) 40 MG tablet TAKE 1 TABLET BY MOUTH EVERY DAY  . [DISCONTINUED] spironolactone (ALDACTONE) 100 MG tablet Take 0.5 tablets (50 mg total) by mouth daily. Please keep upcoming appt with Dr. Radford Pax June 2022 before anymore refills. Thank you Final Attempt     Allergies:   Other and Sulfa drugs cross reactors   Social History   Socioeconomic History  . Marital status: Single    Spouse name: Not on file  . Number of children: 5  . Years of education: 22  . Highest education level: Not on file  Occupational History  . Occupation: [employer]  . Smoking status: Current Every Day Smoker    Packs/day: 0.30    Years: 24.00    Pack years: 7.20    Types: Cigarettes  . Smokeless tobacco: Never Used  . Tobacco comment: about 8 cigarettes per day  Vaping Use  . Vaping Use: Never used  Substance and Sexual Activity  .  Alcohol use: Yes    Comment: occ.   . Drug use: No  . Sexual activity: Not on file  Other Topics Concern  . Not on file  Social History Narrative   Fun/Hobby: [place], anything relaxing   Denies abuse and feels safe at home.   Social Determinants of Health   Financial Resource Strain: Not on file  Food Insecurity: Not on file  Transportation Needs: Not on file  Physical Activity: Not on file  Stress: Not on file  Social Connections: Not on file     Family History: The patient's family history includes Breast cancer in her maternal grandmother; Heart disease in her father; Hypertension in her father and mother; Prostate cancer in her paternal grandfather.  ROS:   Please see the history of present illness.    Review of Systems  Musculoskeletal:  Negative for gout (she has no).    All other systems reviewed and negative.   EKGs/Labs/Other Studies Reviewed:    The following studies were reviewed today: OV notes, 2D echo, labs  EKG:  EKG is ordered today and showed NSR with no ST changes  Recent Labs: 04/24/2021: ALT 13; BUN 13; Creatinine, Ser 1.03; Hemoglobin 14.4; Platelets 243.0; Potassium 4.4; Sodium 139; TSH 1.39   Recent Lipid Panel    Component Value Date/Time   CHOL 220 (H) 04/24/2021 1111   TRIG 117.0 04/24/2021 1111   HDL 45.30 04/24/2021 1111   CHOLHDL 5 04/24/2021 1111   VLDL 23.4 04/24/2021 1111   LDLCALC 151 (H) 04/24/2021 1111    Physical Exam:    VS:  BP (!) 128/94   Pulse 77   Ht 5' 9.5" (1.765 m)   Wt 184 lb 9.6 oz (83.7 kg)   SpO2 99%   BMI 26.87 kg/m     Wt Readings from Last 3 Encounters:  05/09/21 184 lb 9.6 oz (83.7 kg)  04/24/21 183 lb (83 kg)  01/20/20 185 lb (83.9 kg)     GEN: Well nourished, well developed in no acute distress HEENT: Normal NECK: No JVD; No carotid bruits LYMPHATICS: No lymphadenopathy CARDIAC:RRR, no murmurs, rubs, gallops RESPIRATORY:  Clear to auscultation without rales, wheezing or rhonchi  ABDOMEN: Soft, non-tender, non-distended MUSCULOSKELETAL:  No edema; No deformity  SKIN: Warm and dry NEUROLOGIC:  Alert and oriented x 3 PSYCHIATRIC:  Normal affect   ASSESSMENT:    1. Essential hypertension   2. Orthostatic hypotension   3. SOB (shortness of breath)    PLAN:    In order of problems listed above:  1.  HTN -BP elevated this am but at home runs 138/50mmHg -Continue prescription drug management with Carvedilol 25mg  BID and spiro 50mg  daily and refilled for 1 year today -I have personally reviewed and interpreted outside labs performed by patient's PCP which showed SCR 1.03, K+ 4.4 and TSH 1.39  2.  Orthostatic Hypotension -she has not had any further dizziness or syncope -continue to wear compression hose when standing for long periods of  time  3.  SOB -occured when laying down and during the day -denies any exertional CP or pressure -was noted to have small bilateral pleural effusions on renal CT in Dec 2020  -Cxray 01/2020 was normal -breathing has improved on the allergy medicine she started to take when laying down at night -she was having to clear her throat a lot in the am due to congestion ? GERD -Suspected  GERD and started Protonix 40mg  daily a month ago -2D echo, BNP and  renal function were normal -symptoms have resolved on Protonix   Medication Adjustments/Labs and Tests Ordered: Current medicines are reviewed at length with the patient today.  Concerns regarding medicines are outlined above.  Orders Placed This Encounter  Procedures  . EKG 12-Lead   Meds ordered this encounter  Medications  . carvedilol (COREG) 25 MG tablet    Sig: Take 1 tablet (25 mg total) by mouth 2 (two) times daily. Please keep upcoming appt in June 2022 with Dr. Radford Pax before anymore refills. Thank you Final Attempt    Dispense:  180 tablet    Refill:  3    Pt must keep upcoming appt in June 2022 with Dr. Radford Pax before anymore refills. Thank you Final Attempt  . pantoprazole (PROTONIX) 40 MG tablet    Sig: Take 1 tablet (40 mg total) by mouth daily.    Dispense:  90 tablet    Refill:  3  . spironolactone (ALDACTONE) 100 MG tablet    Sig: Take 0.5 tablets (50 mg total) by mouth daily. Please keep upcoming appt with Dr. Radford Pax June 2022 before anymore refills. Thank you Final Attempt    Dispense:  45 tablet    Refill:  3    Pt must keep upcoming appt in June 2022 with Dr. Radford Pax before anymore refills. Thank you Final Attempt    Signed, Fransico Him, MD  05/09/2021 8:57 AM    [place]

## 2021-06-08 IMAGING — DX DG CHEST 2V
2 series · 2 of 2 positions shown · non-contrast
Comparison: September 09, 2016

CLINICAL DATA: Shortness of breath with cough and congestion

EXAM:
CHEST - 2 VIEW

[chest pa]
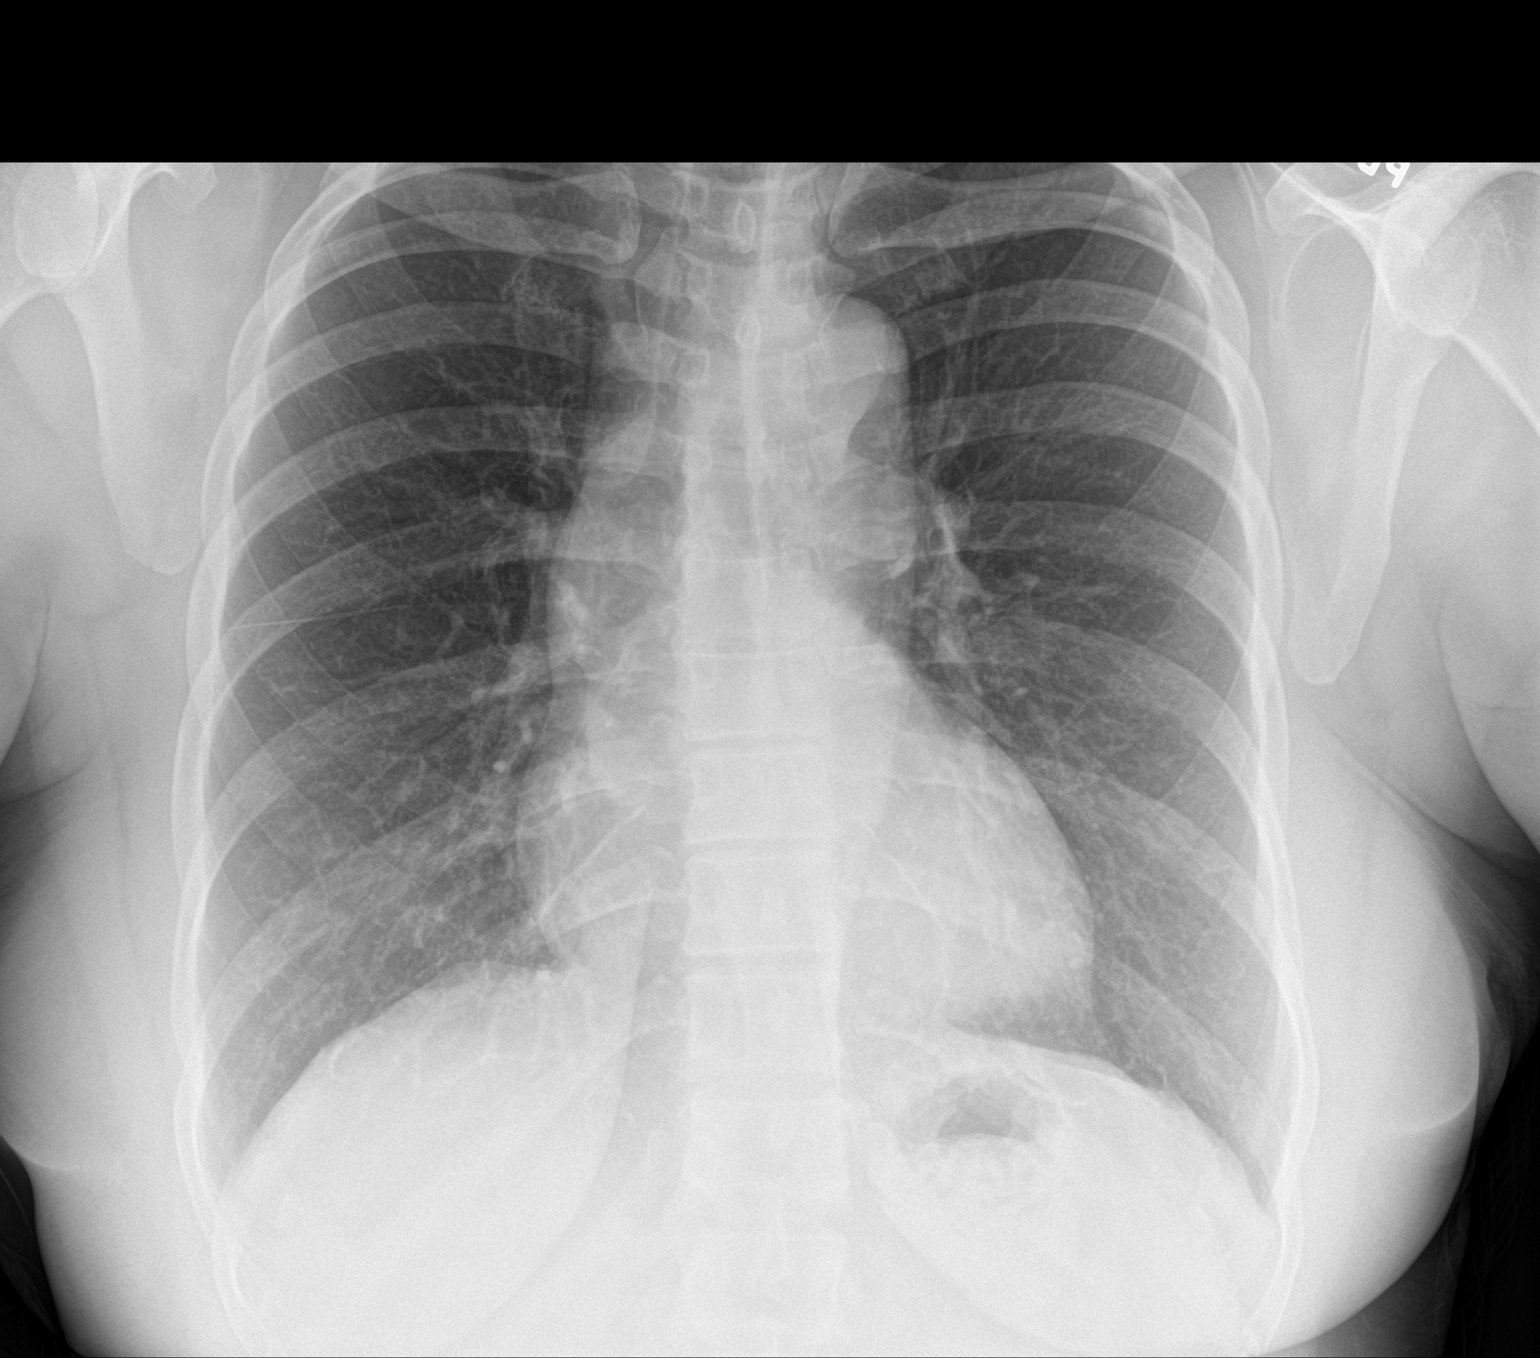

[chest lat]
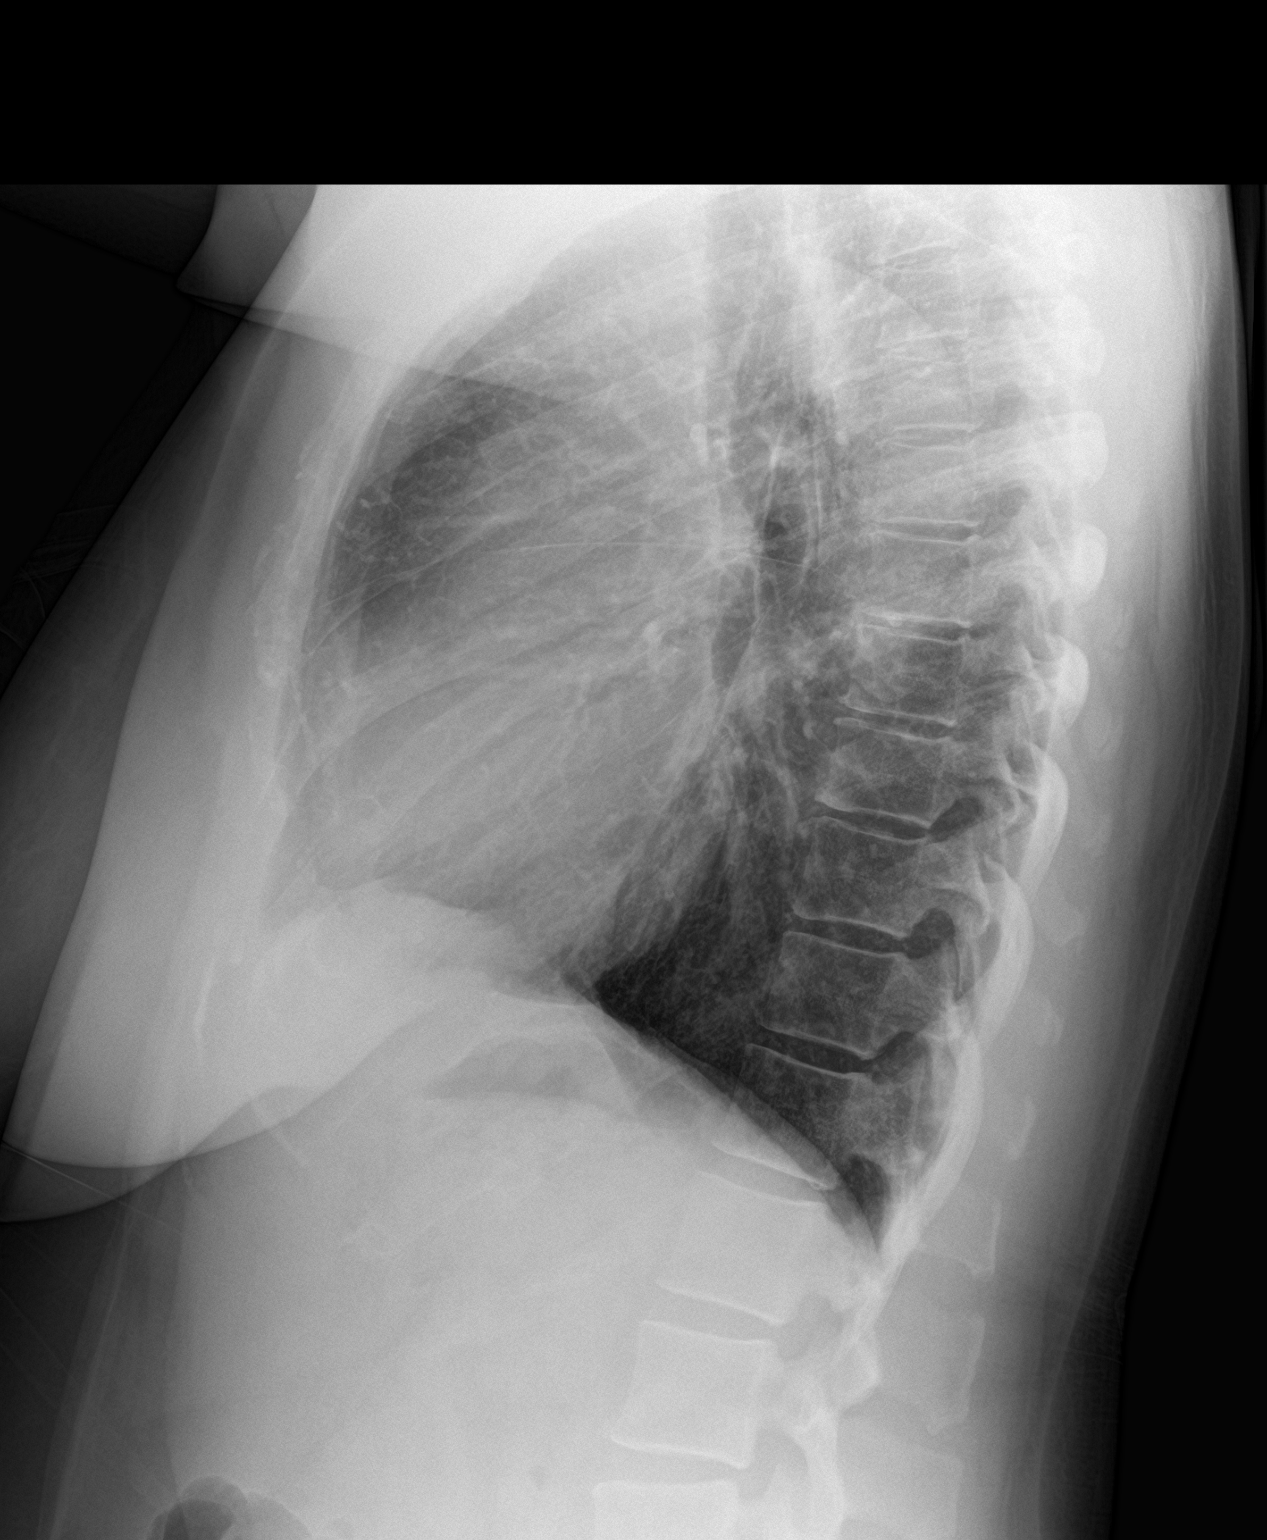

[2 of 2 positions shown; findings below may reference images not displayed]

FINDINGS: Lungs are clear. Heart size and pulmonary vascularity are normal. No
adenopathy. No pneumothorax. No bone lesions.
IMPRESSION: Lungs clear.  No adenopathy.

## 2022-02-21 ENCOUNTER — Ambulatory Visit (HOSPITAL_COMMUNITY)
Admission: EM | Admit: 2022-02-21 | Discharge: 2022-02-21 | Disposition: A | Discharge status: Home / Self Care | Payer: 59 | Attending: Family Medicine | Admitting: Family Medicine

## 2022-02-21 ENCOUNTER — Other Ambulatory Visit: Payer: Self-pay

## 2022-02-21 ENCOUNTER — Encounter (HOSPITAL_COMMUNITY): Payer: Self-pay

## 2022-02-21 DIAGNOSIS — K1379 Other lesions of oral mucosa: Secondary | ICD-10-CM | POA: Diagnosis not present

## 2022-02-21 MED ORDER — TRAMADOL HCL 50 MG PO TABS: 50.0000 mg | ORAL_TABLET | Freq: Four times a day (QID) | ORAL | 0 refills | Status: DC | PRN

## 2022-02-21 MED ORDER — KETOROLAC TROMETHAMINE 30 MG/ML IJ SOLN
30.0000 mg | Freq: Once | INTRAMUSCULAR | Status: AC
  Administered 2022-02-21: 30 mg via INTRAMUSCULAR

## 2022-02-21 MED ORDER — KETOROLAC TROMETHAMINE 30 MG/ML IJ SOLN
INTRAMUSCULAR | Status: AC
  Filled 2022-02-21: qty 1

## 2022-02-21 NOTE — ED Provider Notes (Signed)
?[place] ? ? ? ?CSN: [id number] ?Arrival date & time: 02/21/22  0814 ? ? ?  ? ?History   ?Chief Complaint ?Chief Complaint  ?Patient presents with  ? Dental Pain  ? ? ?HPI ?Debra Wade is a 51 y.o. female.  ? ? ?Dental Pain ?Here for left upper dental pain since March 14. ? ?On March 14 she went to the dentist and had a tooth pulled.  Since then she has been hurting a lot.  She is taking 800 mg total of ibuprofen and Tylenol extra strength and they are not helping the pain much at all.  She did get in contact with the dentist office, and they will be sending in an antibiotic, but they would do nothing for pain. ? ?On review of her PMP, there is nothing on there. ? ?Past Medical History:  ?Diagnosis Date  ? Arthritis   ? Chicken pox   ? Frequent headaches   ? GERD (gastroesophageal reflux disease)   ? Hypertension   ? Stroke [facility])   ? Syncope   ? ? ?Patient Active Problem List  ? Diagnosis Date Noted  ? Hyperglycemia 04/24/2021  ? GERD (gastroesophageal reflux disease) 04/23/2021  ? Exposure to TB 04/16/2021  ? Tobacco abuse 07/06/2017  ? Essential hypertension 06/09/2017  ? Snoring 06/09/2017  ? ? ?Past Surgical History:  ?Procedure Laterality Date  ? TUBAL LIGATION    ? ? ?OB History   ?No obstetric history on file. ?  ? ? ? ?Home Medications   ? ?Prior to Admission medications   ?Medication Sig Start Date End Date Taking? Authorizing Provider  ?traMADol (ULTRAM) 50 MG tablet Take 1 tablet (50 mg total) by mouth every 6 (six) hours as needed. 02/21/22  Yes Barrett Henle, MD  ?albuterol (VENTOLIN HFA) 108 (90 Base) MCG/ACT inhaler Inhale 2 puffs into the lungs every 4 (four) hours as needed for wheezing or shortness of breath. 12/08/20   Rodriguez-Southworth, Sunday Spillers, PA-C  ?carvedilol (COREG) 25 MG tablet Take 1 tablet (25 mg total) by mouth 2 (two) times daily. Please keep upcoming appt in June 2022 with Dr. Radford Pax before anymore refills. Thank you Final Attempt 05/09/21   Sueanne Margarita, MD   ?Cyanocobalamin (VITAMIN B-12 PO) Take by mouth.    [provider]  ?pantoprazole (PROTONIX) 40 MG tablet Take 1 tablet (40 mg total) by mouth daily. 05/09/21   Sueanne Margarita, MD  ?spironolactone (ALDACTONE) 100 MG tablet Take 0.5 tablets (50 mg total) by mouth daily. Please keep upcoming appt with Dr. Radford Pax June 2022 before anymore refills. Thank you Final Attempt 05/09/21   Sueanne Margarita, MD  ? ? ?Family History ?Family History  ?Problem Relation Age of Onset  ? Hypertension Mother   ? Hypertension Father   ? Heart disease Father   ? Breast cancer Maternal Grandmother   ? Prostate cancer Paternal Grandfather   ? ? ?Social History ?Social History  ? ?Tobacco Use  ? Smoking status: Every Day  ?  Packs/day: 0.30  ?  Years: 24.00  ?  Pack years: 7.20  ?  Types: Cigarettes  ? Smokeless tobacco: Never  ? Tobacco comments:  ?  about 8 cigarettes per day  ?Vaping Use  ? Vaping Use: Never used  ?Substance Use Topics  ? Alcohol use: Yes  ?  Comment: occ.   ? Drug use: No  ? ? ? ?Allergies   ?Other and Sulfa drugs cross reactors ? ? ?Review  of Systems ?Review of Systems ? ? ?Physical Exam ?Triage Vital Signs ?ED Triage Vitals  ?Enc Vitals Group  ?   BP 02/21/22 0901 (!) 150/99  ?   Pulse Rate 02/21/22 0901 80  ?   Resp 02/21/22 0901 19  ?   Temp 02/21/22 0901 (!) 97.5 ?F (36.4 ?C)  ?   Temp src --   ?   SpO2 02/21/22 0901 97 %  ?   Weight --   ?   Height --   ?   Head Circumference --   ?   Peak Flow --   ?   Pain Score 02/21/22 0900 6  ?   Pain Loc --   ?   Pain Edu? --   ?   Excl. in [place]? --   ? ?No data found. ? ?Updated Vital Signs ?BP (!) 150/99   Pulse 80   Temp (!) 97.5 ?F (36.4 ?C)   Resp 19   LMP 02/05/2022   SpO2 97%  ? ?Visual Acuity ?Right Eye Distance:   ?Left Eye Distance:   ?Bilateral Distance:   ? ?Right Eye Near:   ?Left Eye Near:    ?Bilateral Near:    ? ?Physical Exam ?Vitals reviewed.  ?Constitutional:   ?   General: She is not in acute distress. ?   Appearance: She is not ill-appearing,  toxic-appearing or diaphoretic.  ?HENT:  ?   Right Ear: Tympanic membrane and ear canal normal.  ?   Left Ear: Tympanic membrane and ear canal normal.  ?   Nose: Nose normal.  ?   Mouth/Throat:  ?   Mouth: Mucous membranes are moist.  ?   Pharynx: No oropharyngeal exudate or posterior oropharyngeal erythema.  ?   Comments: There is a little gray discharge/tissue on the upper dental ridge where the #11 tooth was extracted.  No swelling or abscess seen ?Cardiovascular:  ?   Rate and Rhythm: Normal rate and regular rhythm.  ?Pulmonary:  ?   Effort: Pulmonary effort is normal.  ?   Breath sounds: Normal breath sounds.  ?Musculoskeletal:  ?   Cervical back: Neck supple.  ?Lymphadenopathy:  ?   Cervical: No cervical adenopathy.  ?Skin: ?   Coloration: Skin is not pale.  ?Neurological:  ?   General: No focal deficit present.  ?   Mental Status: She is alert and oriented to person, place, and time.  ?Psychiatric:     ?   Behavior: Behavior normal.  ? ? ? ?UC Treatments / Results  ?Labs ?(all labs ordered are listed, but only abnormal results are displayed) ?Labs Reviewed - No data to display ? ?EKG ? ? ?Radiology ?No results found. ? ?Procedures ?Procedures (including critical care time) ? ?Medications Ordered in UC ?Medications  ?ketorolac (TORADOL) 30 MG/ML injection 30 mg (has no administration in time range)  ? ? ?Initial Impression / Assessment and Plan / UC Course  ?I have reviewed the triage vital signs and the nursing notes. ? ?Pertinent labs & imaging results that were available during my care of the patient were reviewed by me and considered in my medical decision making (see chart for details). ? ?  ? ?We will send in a short prescription of tramadol for her to use as needed.  The dental practices center amoxicillin and already ?Final Clinical Impressions(s) / UC Diagnoses  ? ?Final diagnoses:  ?Oral pain  ? ? ? ?Discharge Instructions   ? ?  ?You have been  given a shot of Toradol 30 mg here in the office ? ?Take  tramadol 50 mg 1 every 6 hours as needed for pain.  You may continue also take Tylenol and/or ibuprofen if they help a little bit also ? ?Take the antibiotic prescribed by the dentist ? ? ? ? ?ED Prescriptions   ? ? Medication Sig Dispense Auth. Provider  ? traMADol (ULTRAM) 50 MG tablet Take 1 tablet (50 mg total) by mouth every 6 (six) hours as needed. 10 tablet Barrett Henle, MD  ? ?  ? ?I have reviewed the PDMP during this encounter. ?  ?Barrett Henle, MD ?02/21/22 [phone] ? ?

## 2022-02-21 NOTE — Discharge Instructions (Addendum)
You have been given a shot of Toradol 30 mg here in the office ? ?Take tramadol 50 mg 1 every 6 hours as needed for pain.  You may continue also take Tylenol and/or ibuprofen if they help a little bit also ? ?Take the antibiotic prescribed by the dentist ?

## 2022-02-21 NOTE — ED Triage Notes (Signed)
Pt presents with pain on her left side of her face. Reports having a tooth pulled on Tuesday. Reports pain is in her jaw and going into her ear. Reprots dentist gave her tylenol and ibuprofen with no relief at home.  ?

## 2022-05-27 ENCOUNTER — Other Ambulatory Visit: Payer: Self-pay | Admitting: Cardiology

## 2022-05-27 ENCOUNTER — Telehealth: Payer: Self-pay | Admitting: Cardiology

## 2022-05-27 MED ORDER — PANTOPRAZOLE SODIUM 40 MG PO TBEC
40.0000 mg | DELAYED_RELEASE_TABLET | Freq: Every day | ORAL | 0 refills | Status: DC
Start: 2022-05-27 — End: 2022-08-12

## 2022-05-27 NOTE — Telephone Encounter (Signed)
Pt's medication was sent to pt's pharmacy as requested. Confirmation received.  °

## 2022-05-27 NOTE — Telephone Encounter (Signed)
*  STAT* If patient is at the pharmacy, call can be transferred to refill team.   1. Which medications need to be refilled? (please list name of each medication and dose if known) pantoprazole (PROTONIX) 40 MG tablet  2. Which pharmacy/location (including street and city if local pharmacy) is medication to be sent to? [id number] [place], [place] - [address]  3. Do they need a 30 day or 90 day supply? [address]

## 2022-06-23 ENCOUNTER — Other Ambulatory Visit: Payer: Self-pay | Admitting: Cardiology

## 2022-08-07 ENCOUNTER — Ambulatory Visit: Payer: 59 | Admitting: Cardiology

## 2022-08-07 ENCOUNTER — Telehealth: Payer: Self-pay | Admitting: Cardiology

## 2022-08-07 MED ORDER — CARVEDILOL 25 MG PO TABS: 25.0000 mg | ORAL_TABLET | Freq: Two times a day (BID) | ORAL | 0 refills | Status: DC

## 2022-08-07 NOTE — Telephone Encounter (Signed)
*  STAT* If patient is at the pharmacy, call can be transferred to refill team.   1. Which medications need to be refilled? (please list name of each medication and dose if known)   carvedilol (COREG) 25 MG tablet    2. Which pharmacy/location (including street and city if local pharmacy) is medication to be sent to? [id number] [place], [place] - [address]  3. Do they need a 30 day or 90 day supply? 7 day supply   Pt is out of this medication and requesting enough to last until her f/u 08/12/22 with [facility]

## 2022-08-07 NOTE — Telephone Encounter (Signed)
Pt's medication was sent to pt's pharmacy as requested. Confirmation received.  °

## 2022-08-08 NOTE — Progress Notes (Unsigned)
Cardiology Office Note:    Date:  08/12/2022   ID:  SHAMICKA INGA, DOB May 31, 1971, MRN [id number]  PCP:  Binnie Rail, MD  [place] [facility] Cardiologist:  Fransico Him, MD     Referring MD: Binnie Rail, MD   Chief Complaint:  Follow-up     History of Present Illness:   Debra Wade is a 51 y.o. female   with a hx of HAs, HTN and CVA.  She has not tolerated amlodipine due to LE edema and had syncope and orthostatic hypotension after taking metoprolol and Losartan HCT.  She also has a history of preeclampsia. She was encouraged to use compression hose when standing for long periods of time at work. She was seen back several times in HTN clinic and Carvedilol titrated for BP goal < 130/81mHg.  She was started on spironolactone '25mg'$  daily and subsequently increased to '50mg'$  daily.  Carvedilol eventually increased to '25mg'$  BID.    Patient last saw Dr. TRadford Pax6/2022 and doing well. Previous dyspnea when laying down felt due to GERD and resolved with Protonix. Echo was normal.  Patient comes in for f/u. 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 Getting 10,000 steps daily. Not as much stress. BP well controlled. She says when she's working hard and busy her HR gets to 105/m and it scares her so she takes a deep breath and slows down. She is working 12 hr shifts 6 days a week. No exercise outside of work with her schedule.  Father died MI 562          Past Medical History:  Diagnosis Date   Arthritis    Chicken pox    Frequent headaches    GERD (gastroesophageal reflux disease)    Hypertension    Stroke (HCC)    Syncope    Current Medications: Current Meds  Medication Sig   albuterol (VENTOLIN HFA) 108 (90 Base) MCG/ACT inhaler Inhale 2 puffs into the lungs every 4 (four) hours as needed for wheezing or shortness of breath.   carvedilol (COREG) 25 MG tablet Take 1 tablet (25 mg total) by mouth 2 (two) times daily.   pantoprazole (PROTONIX) 40 MG tablet Take 1 tablet  (40 mg total) by mouth daily.   spironolactone (ALDACTONE) 100 MG tablet Take 0.5 tablets (50 mg total) by mouth daily. KEEP UPCOMING APPOINTMENT TO RECEIVE FURTHER REFILLS AT IT.    Allergies:   Other and Sulfa drugs cross reactors   Social History   Tobacco Use   Smoking status: Every Day    Packs/day: 0.30    Years: 24.00    Total pack years: 7.20    Types: Cigarettes   Smokeless tobacco: Never   Tobacco comments:    about 8 cigarettes per day  Vaping Use   Vaping Use: Never used  Substance Use Topics   Alcohol use: Yes    Comment: occ.    Drug use: No    Family Hx: The patient's family history includes Breast cancer in her maternal grandmother; Heart disease in her father; Hypertension in her father and mother; Prostate cancer in her paternal grandfather.  ROS   EKGs/Labs/Other Test Reviewed:    EKG:  EKG is   ordered today.  The ekg ordered today demonstrates NSR with nonspecific ST changes  Recent Labs: No results found for requested labs within last 365 days.   Recent Lipid Panel No results for input(s): "CHOL", "TRIG", "HDL", "VLDL", "LDLCALC", "LDLDIRECT" in the last  8760 hours.   Prior CV Studies: ECHO COMPLETE WO IMAGING ENHANCING AGENT AND WITH 3D 02/01/2020  Narrative ECHOCARDIOGRAM REPORT    Patient Name:   Debra Wade Date of Exam: 02/01/2020 Medical Rec #:  [id number]      Height:       70.0 in Accession #:    [id number]     Weight:       185.0 lb Date of Birth:  12-15-1970       BSA:          2.019 m Patient Age:    79 years       BP:           130/98 mmHg Patient Gender: F              HR:           79 bpm. Exam Location:  [address]  Procedure: 2D Echo, 3D Echo, Cardiac Doppler, Color Doppler and Strain Analysis  Indications:    I10 Hypertension  History:        Patient has prior history of Echocardiogram examinations, most recent 07/06/2017. Stroke, Signs/Symptoms:Orthostatic hypotension and Syncope; Risk Factors:Hypertension and  Current Smoker.  Sonographer:    Jessee Avers, RDCS Referring Phys: [place]   1. Left ventricular ejection fraction, by estimation, is 60 to 65%. The left ventricle has normal function. The left ventricle has no regional wall motion abnormalities. Left ventricular diastolic parameters were normal. 2. Right ventricular systolic function is normal. The right ventricular size is normal. There is normal pulmonary artery systolic pressure. 3. The mitral valve is normal in structure and function. Trivial mitral valve regurgitation. No evidence of mitral stenosis. 4. The aortic valve is tricuspid. Aortic valve regurgitation is not visualized. No aortic stenosis is present. 5. The inferior vena cava is normal in size with greater than 50% respiratory variability, suggesting right atrial pressure of 3 mmHg.  Comparison(s): No significant change from prior study. 07/06/17 EF 60-65%.  Conclusion(s)/Recommendation(s): Normal biventricular function without evidence of hemodynamically significant valvular heart disease.  FINDINGS Left Ventricle: Left ventricular ejection fraction, by estimation, is 60 to 65%. The left ventricle has normal function. The left ventricle has no regional wall motion abnormalities. The left ventricular internal cavity size was normal in size. There is no left ventricular hypertrophy. Left ventricular diastolic parameters were normal.  Right Ventricle: The right ventricular size is normal. No increase in right ventricular wall thickness. Right ventricular systolic function is normal. There is normal pulmonary artery systolic pressure. The tricuspid regurgitant velocity is 2.18 m/s, and with an assumed right atrial pressure of 3 mmHg, the estimated right ventricular systolic pressure is 89.2 mmHg.  Left Atrium: Left atrial size was normal in size.  Right Atrium: Right atrial size was normal in size.  Pericardium: Trivial pericardial effusion is  present.  Mitral Valve: The mitral valve is normal in structure and function. Trivial mitral valve regurgitation. No evidence of mitral valve stenosis.  Tricuspid Valve: The tricuspid valve is normal in structure. Tricuspid valve regurgitation is mild.  Aortic Valve: The aortic valve is tricuspid. Aortic valve regurgitation is not visualized. No aortic stenosis is present.  Pulmonic Valve: The pulmonic valve was normal in structure. Pulmonic valve regurgitation is not visualized. No evidence of pulmonic stenosis.  Aorta: The aortic root, ascending aorta and aortic arch are all structurally normal, with no evidence of dilitation or obstruction.  Pulmonary Artery: The pulmonary artery is of normal  size.  Venous: The inferior vena cava is normal in size with greater than 50% respiratory variability, suggesting right atrial pressure of 3 mmHg.  IAS/Shunts: No atrial level shunt detected by color flow Doppler.   LEFT VENTRICLE PLAX 2D LVIDd:         4.60 cm  Diastology LVIDs:         3.10 cm  LV e' lateral:   7.54 cm/s LV PW:         1.00 cm  LV E/e' lateral: 6.6 LV IVS:        1.10 cm  LV e' medial:    4.78 cm/s LVOT diam:     2.00 cm  LV E/e' medial:  10.4 LV SV:         67 LV SV Index:   33       2D Longitudinal Strain LVOT Area:     3.14 cm 2D Strain GLS (A2C):   -17.4 % 2D Strain GLS (A3C):   -21.1 % 2D Strain GLS (A4C):   -18.4 % 2D Strain GLS Avg:     -19.0 %  3D Volume EF: 3D EF:        58 % LV EDV:       114 ml LV ESV:       48 ml LV SV:        66 ml  RIGHT VENTRICLE RV Basal diam:  3.10 cm RV S prime:     8.76 cm/s TAPSE (M-mode): 2.2 cm RVSP:           22.0 mmHg  LEFT ATRIUM             Index       RIGHT ATRIUM           Index LA diam:        3.60 cm 1.78 cm/m  RA Pressure: 3.00 mmHg LA Vol (A2C):   36.3 ml 17.98 ml/m RA Area:     13.30 cm LA Vol (A4C):   32.8 ml 16.24 ml/m RA Volume:   31.30 ml  15.50 ml/m LA Biplane Vol: 34.5 ml 17.09 ml/m AORTIC  VALVE LVOT Vmax:   100.00 cm/s LVOT Vmean:  67.800 cm/s LVOT VTI:    0.212 m  AORTA Ao Root diam: 3.50 cm Ao Asc diam:  3.40 cm  MITRAL VALVE               TRICUSPID VALVE TR Peak grad:   19.0 mmHg TR Vmax:        218.00 cm/s MV E velocity: 49.90 cm/s  Estimated RAP:  3.00 mmHg MV A velocity: 50.80 cm/s  RVSP:           22.0 mmHg MV E/A ratio:  0.98 SHUNTS Systemic VTI:  0.21 m Systemic Diam: 2.00 cm  Buford Dresser MD Electronically signed by Buford Dresser MD Signature Date/Time: 02/01/2020/11:55:09 AM    Final      Risk Assessment/Calculations/Metrics:              Physical Exam:    VS:  BP 118/78   Pulse 75   Ht 5' 9.5" (1.765 m)   Wt 184 lb (83.5 kg)   SpO2 98%   BMI 26.78 kg/m     GEN: Well nourished, well developed, in no acute distress  Neck: no JVD, carotid bruits, or masses Cardiac:RRR; no murmurs, rubs, or gallops  Respiratory:  clear to auscultation bilaterally, normal work of breathing GI: soft, nontender, nondistended, +  BS Ext: without cyanosis, clubbing, or edema, Good distal pulses bilaterally Neuro:  Alert and Oriented x 3,  Psych: euthymic mood, full affect  Wt Readings from Last 3 Encounters:  08/12/22 184 lb (83.5 kg)  05/09/21 184 lb 9.6 oz (83.7 kg)  04/24/21 183 lb (83 kg)            ASSESSMENT & PLAN:   No problem-specific Assessment & Plan notes found for this encounter.   HTN BP well controlled  History of orthostatic hypotension resolved with compression stockings when standing for long periods. No longer an issue with current job  HLD -LDL >150 last year and father died MI 44. Check FLP today and order coronary calcium score. Most likely needs a statin.  Dyspnea felt due to GERD          Dispo:  No follow-ups on file.   Medication Adjustments/Labs and Tests Ordered: Current medicines are reviewed at length with the patient today.  Concerns regarding medicines are outlined above.  Tests  Ordered: Orders Placed This Encounter  Procedures   CT CARDIAC SCORING (SELF PAY ONLY)   Comprehensive metabolic panel   CBC   TSH   Lipid panel   EKG 12-Lead   Medication Changes: No orders of the defined types were placed in this encounter.  Sumner Boast, PA-C  08/12/2022 8:42 AM    [address], [place], [place]  [address] Phone: [phone]; Fax: [phone]

## 2022-08-12 ENCOUNTER — Encounter: Payer: Self-pay | Admitting: Physician Assistant

## 2022-08-12 ENCOUNTER — Ambulatory Visit: Payer: 59 | Attending: Cardiology | Admitting: Physician Assistant

## 2022-08-12 ENCOUNTER — Other Ambulatory Visit: Payer: Self-pay

## 2022-08-12 VITALS — BP 118/78 | HR 75 | Ht 69.5 in | Wt 184.0 lb

## 2022-08-12 DIAGNOSIS — I951 Orthostatic hypotension: Secondary | ICD-10-CM | POA: Diagnosis not present

## 2022-08-12 DIAGNOSIS — K219 Gastro-esophageal reflux disease without esophagitis: Secondary | ICD-10-CM

## 2022-08-12 DIAGNOSIS — E785 Hyperlipidemia, unspecified: Secondary | ICD-10-CM

## 2022-08-12 DIAGNOSIS — Z8249 Family history of ischemic heart disease and other diseases of the circulatory system: Secondary | ICD-10-CM

## 2022-08-12 DIAGNOSIS — I10 Essential (primary) hypertension: Secondary | ICD-10-CM | POA: Diagnosis not present

## 2022-08-12 MED ORDER — CARVEDILOL 25 MG PO TABS: 25.0000 mg | ORAL_TABLET | Freq: Two times a day (BID) | ORAL | 3 refills | Status: DC

## 2022-08-12 MED ORDER — SPIRONOLACTONE 100 MG PO TABS: 50.0000 mg | ORAL_TABLET | Freq: Every day | ORAL | 3 refills | Status: DC

## 2022-08-12 MED ORDER — PANTOPRAZOLE SODIUM 40 MG PO TBEC: 40.0000 mg | DELAYED_RELEASE_TABLET | Freq: Every day | ORAL | 3 refills | Status: DC

## 2022-08-12 NOTE — Patient Instructions (Addendum)
Medication Instructions:  Your physician recommends that you continue on your current medications as directed. Please refer to the Current Medication list given to you today.  *If you need a refill on your cardiac medications before your next appointment, please call your pharmacy*   Lab Work: TODAY: CMET, CBC, TSH, FLP  If you have labs (blood work) drawn today and your tests are completely normal, you will receive your results only by: [facility] [place] (if you have MyChart) OR A paper copy in the mail If you have any lab test that is abnormal or we need to change your treatment, we will call you to review the results.   Testing/Procedures: CT Calcium Score (self-pay)   Follow-Up: At [facility], you and your health needs are our priority.  As part of our continuing mission to provide you with exceptional heart care, we have created designated Provider Care Teams.  These Care Teams include your primary Cardiologist (physician) and Advanced Practice Providers (APPs -  Physician Assistants and Nurse Practitioners) who all work together to provide you with the care you need, when you need it.   Your next appointment:   1 year(s)  The format for your next appointment:   In Person  Provider:   Fransico Him, MD

## 2022-08-13 ENCOUNTER — Other Ambulatory Visit: Payer: Self-pay

## 2022-08-13 DIAGNOSIS — E785 Hyperlipidemia, unspecified: Secondary | ICD-10-CM

## 2022-08-13 LAB — CBC
Hematocrit: 42.6 % (ref 34.0–46.6)
Hemoglobin: 14.4 g/dL (ref 11.1–15.9)
MCH: 32.9 pg (ref 26.6–33.0)
MCHC: 33.8 g/dL (ref 31.5–35.7)
MCV: 97 fL (ref 79–97)
Platelets: 247 10*3/uL (ref 150–450)
RBC: 4.38 x10E6/uL (ref 3.77–5.28)
RDW: 12.5 % (ref 11.7–15.4)
WBC: 6.5 10*3/uL (ref 3.4–10.8)

## 2022-08-13 LAB — COMPREHENSIVE METABOLIC PANEL
ALT: 9 IU/L (ref 0–32)
AST: 13 IU/L (ref 0–40)
Albumin/Globulin Ratio: 1.8 (ref 1.2–2.2)
Albumin: 4.5 g/dL (ref 3.9–4.9)
Alkaline Phosphatase: 46 IU/L (ref 44–121)
BUN/Creatinine Ratio: 9 (ref 9–23)
BUN: 11 mg/dL (ref 6–24)
Bilirubin Total: 0.4 mg/dL (ref 0.0–1.2)
CO2: 23 mmol/L (ref 20–29)
Calcium: 9.4 mg/dL (ref 8.7–10.2)
Chloride: 105 mmol/L (ref 96–106)
Creatinine, Ser: 1.2 mg/dL — ABNORMAL HIGH (ref 0.57–1.00)
Globulin, Total: 2.5 g/dL (ref 1.5–4.5)
Glucose: 96 mg/dL (ref 70–99)
Potassium: 4.1 mmol/L (ref 3.5–5.2)
Sodium: 142 mmol/L (ref 134–144)
Total Protein: 7 g/dL (ref 6.0–8.5)
eGFR: 55 mL/min/{1.73_m2} — ABNORMAL LOW (ref >59)

## 2022-08-13 LAB — LIPID PANEL
Chol/HDL Ratio: 5.1 ratio — ABNORMAL HIGH (ref 0.0–4.4)
Cholesterol, Total: 226 mg/dL — ABNORMAL HIGH (ref 100–199)
HDL: 44 mg/dL (ref >39)
LDL Chol Calc (NIH): 162 mg/dL — ABNORMAL HIGH (ref 0–99)
Triglycerides: 112 mg/dL (ref 0–149)
VLDL Cholesterol Cal: 20 mg/dL (ref 5–40)

## 2022-08-13 LAB — TSH: TSH: 2.77 u[IU]/mL (ref 0.450–4.500)

## 2022-08-13 MED ORDER — ATORVASTATIN CALCIUM 40 MG PO TABS: 40.0000 mg | ORAL_TABLET | Freq: Every day | ORAL | 3 refills | Status: DC

## 2022-08-19 ENCOUNTER — Ambulatory Visit (HOSPITAL_COMMUNITY): Payer: 59

## 2022-09-03 ENCOUNTER — Ambulatory Visit (HOSPITAL_COMMUNITY)
Admission: EM | Admit: 2022-09-03 | Discharge: 2022-09-03 | Disposition: A | Discharge status: Home / Self Care | Payer: POS | Attending: Emergency Medicine | Admitting: Emergency Medicine

## 2022-09-03 ENCOUNTER — Encounter (HOSPITAL_COMMUNITY): Payer: Self-pay | Admitting: Emergency Medicine

## 2022-09-03 ENCOUNTER — Other Ambulatory Visit: Payer: Self-pay

## 2022-09-03 DIAGNOSIS — Z1152 Encounter for screening for COVID-19: Secondary | ICD-10-CM | POA: Diagnosis not present

## 2022-09-03 DIAGNOSIS — M545 Low back pain, unspecified: Secondary | ICD-10-CM | POA: Insufficient documentation

## 2022-09-03 DIAGNOSIS — R141 Gas pain: Secondary | ICD-10-CM | POA: Diagnosis present

## 2022-09-03 DIAGNOSIS — R1030 Lower abdominal pain, unspecified: Secondary | ICD-10-CM

## 2022-09-03 DIAGNOSIS — J069 Acute upper respiratory infection, unspecified: Secondary | ICD-10-CM

## 2022-09-03 LAB — RESP PANEL BY RT-PCR (FLU A&B, COVID) ARPGX2
Influenza A by PCR: NEGATIVE
Influenza B by PCR: NEGATIVE
SARS Coronavirus 2 by RT PCR: NEGATIVE

## 2022-09-03 LAB — POCT URINALYSIS DIPSTICK, ED / UC
Bilirubin Urine: NEGATIVE
Glucose, UA: NEGATIVE mg/dL
Ketones, ur: NEGATIVE mg/dL
Leukocytes,Ua: NEGATIVE
Nitrite: NEGATIVE
Protein, ur: 30 mg/dL — AB
Specific Gravity, Urine: 1.015 (ref 1.005–1.030)
Urobilinogen, UA: 1 mg/dL (ref 0.0–1.0)
pH: 6.5 (ref 5.0–8.0)

## 2022-09-03 LAB — POC URINE PREG, ED: Preg Test, Ur: NEGATIVE

## 2022-09-03 MED ORDER — ACETAMINOPHEN 325 MG PO TABS
650.0000 mg | ORAL_TABLET | Freq: Once | ORAL | Status: AC
  Administered 2022-09-03: 650 mg via ORAL

## 2022-09-03 MED ORDER — ACETAMINOPHEN 325 MG PO TABS
ORAL_TABLET | ORAL | Status: AC
  Filled 2022-09-03: qty 2

## 2022-09-03 NOTE — Discharge Instructions (Addendum)
We will call you if your covid/flu test returns positive.  I recommend symptomatic care with Mucinex (guaifenesin) twice daily for the next 5 days.  You can use Tylenol as needed for headache or fever.  For the abdominal pain/gas pain, if you feel constipated you can try MiraLAX cleanout.  If at any point symptoms worsen please go to the emergency department.

## 2022-09-03 NOTE — ED Triage Notes (Addendum)
Headache, congestion, cramping .  Headache and sinus pressure started last night.  Lower back and stomach cramps started today.  Last bm was yesterday and considered diarrhea.  Has had nausea, no vomiting.    Patient mentioned white discharge on panties today

## 2022-09-03 NOTE — ED Provider Notes (Signed)
[place]    CSN: [id number] Arrival date & time: 09/03/22  1813      History   Chief Complaint Chief Complaint  Patient presents with   URI    HPI SECRET KRISTENSEN is a 51 y.o. female.  Presents with sinus pressure, headache that began last night. No fever or cough. Son was recently sick with URI symptoms.  Today developed abdominal pain, mostly lower especially left side. 7/10 pain that radiates to low back Denies urinary symptoms Diarrhea yesterday, but has felt constipated for a few weeks. Denies vomiting. History of tubal ligation, no other abdominal surgeries LMP 9/5  Tylenol taken 5 hours ago  Past Medical History:  Diagnosis Date   Arthritis    Chicken pox    Frequent headaches    GERD (gastroesophageal reflux disease)    Hypertension    Stroke [facility])    Syncope     Patient Active Problem List   Diagnosis Date Noted   Hyperglycemia 04/24/2021   GERD (gastroesophageal reflux disease) 04/23/2021   Exposure to TB 04/16/2021   Tobacco abuse 07/06/2017   Essential hypertension 06/09/2017   Snoring 06/09/2017    Past Surgical History:  Procedure Laterality Date   TUBAL LIGATION      OB History   No obstetric history on file.      Home Medications    Prior to Admission medications   Medication Sig Start Date End Date Taking? Authorizing Provider  albuterol (VENTOLIN HFA) 108 (90 Base) MCG/ACT inhaler Inhale 2 puffs into the lungs every 4 (four) hours as needed for wheezing or shortness of breath. 12/08/20   Rodriguez-Southworth, Sunday Spillers, PA-C  atorvastatin (LIPITOR) 40 MG tablet Take 1 tablet (40 mg total) by mouth daily. 08/13/22   Imogene Burn, PA-C  carvedilol (COREG) 25 MG tablet Take 1 tablet (25 mg total) by mouth 2 (two) times daily. 08/12/22   Imogene Burn, PA-C  Cyanocobalamin (VITAMIN B-12 PO) Take by mouth. Patient not taking: Reported on 08/12/2022    [provider]  pantoprazole (PROTONIX) 40 MG tablet Take 1  tablet (40 mg total) by mouth daily. 08/12/22   Imogene Burn, PA-C  spironolactone (ALDACTONE) 100 MG tablet Take 0.5 tablets (50 mg total) by mouth daily. 08/12/22   Imogene Burn, PA-C    Family History Family History  Problem Relation Age of Onset   Hypertension Mother    Hypertension Father    Heart disease Father    Breast cancer Maternal Grandmother    Prostate cancer Paternal Grandfather     Social History Social History   Tobacco Use   Smoking status: Every Day    Packs/day: 0.30    Years: 24.00    Total pack years: 7.20    Types: Cigarettes   Smokeless tobacco: Never   Tobacco comments:    about 8 cigarettes per day  Vaping Use   Vaping Use: Never used  Substance Use Topics   Alcohol use: Yes    Comment: occ.    Drug use: No     Allergies   Other and Sulfa drugs cross reactors   Review of Systems Review of Systems Per HPI  Physical Exam Triage Vital Signs ED Triage Vitals  Enc Vitals Group     BP      Pulse      Resp      Temp      Temp src      SpO2  Weight      Height      Head Circumference      Peak Flow      Pain Score      Pain Loc      Pain Edu?      Excl. in [place]?    No data found.  Updated Vital Signs BP (!) 154/93 (BP Location: Right Arm)   Pulse 68   Temp 98.7 F (37.1 C) (Oral)   Resp 20   LMP 08/12/2022   SpO2 98%    Physical Exam Vitals and nursing note reviewed.  Constitutional:      Appearance: Normal appearance.  HENT:     Mouth/Throat:     Mouth: Mucous membranes are moist.     Pharynx: Oropharynx is clear.  Eyes:     Conjunctiva/sclera: Conjunctivae normal.  Cardiovascular:     Rate and Rhythm: Normal rate and regular rhythm.     Heart sounds: Normal heart sounds.  Pulmonary:     Effort: Pulmonary effort is normal. No respiratory distress.     Breath sounds: Normal breath sounds.  Abdominal:     Tenderness: There is abdominal tenderness in the suprapubic area and left lower quadrant. There is  no right CVA tenderness, left CVA tenderness or guarding.     Comments: Very tender with palpation   Musculoskeletal:        General: Normal range of motion.  Skin:    General: Skin is warm and dry.  Neurological:     Mental Status: She is alert and oriented to person, place, and time.      UC Treatments / Results  Labs (all labs ordered are listed, but only abnormal results are displayed) Labs Reviewed  POCT URINALYSIS DIPSTICK, ED / UC - Abnormal; Notable for the following components:      Result Value   Hgb urine dipstick LARGE (*)    Protein, ur 30 (*)    All other components within normal limits  RESP PANEL BY RT-PCR (FLU A&B, COVID) ARPGX2  POC URINE PREG, ED    EKG   Radiology No results found.  Procedures Procedures   Medications Ordered in UC Medications  acetaminophen (TYLENOL) tablet 650 mg (650 mg Oral Given 09/03/22 1945)    Initial Impression / Assessment and Plan / UC Course  I have reviewed the triage vital signs and the nursing notes.  Pertinent labs & imaging results that were available during my care of the patient were reviewed by me and considered in my medical decision making (see chart for details).  Afebrile here Patient is well appearing but with sharp abdominal pain on palpation. Tylenol dose given for pain. Patient passed gas in the bathroom and feels relief of pain. On re-exam she is no longer tender. Discussed use of stool softener, increasing fluids and monitoring pain. Strict ED precautions.  Urinalysis large hemoglobin although on chart review, this has been present the past 6 years. Urine preg negative.  Covid/flu test pending. Discussed symptomatic care for viral etiology. Work note provided. Return precautions discussed. Patient agrees to plan   Final Clinical Impressions(s) / UC Diagnoses   Final diagnoses:  Upper respiratory tract infection, unspecified type  Gas pain  Lower abdominal pain     Discharge Instructions       We will call you if your covid/flu test returns positive.  I recommend symptomatic care with Mucinex (guaifenesin) twice daily for the next 5 days.  You can use Tylenol as  needed for headache or fever.  For the abdominal pain/gas pain, if you feel constipated you can try MiraLAX cleanout.  If at any point symptoms worsen please go to the emergency department.      ED Prescriptions   None    PDMP not reviewed this encounter.   Arieona Swaggerty, [place] Jefferson 09/03/22 2019

## 2022-09-22 ENCOUNTER — Inpatient Hospital Stay (HOSPITAL_COMMUNITY): Admission: RE | Admit: 2022-09-22 | Payer: 59 | Source: Ambulatory Visit

## 2022-11-11 ENCOUNTER — Other Ambulatory Visit: Payer: 59

## 2022-11-14 ENCOUNTER — Emergency Department (HOSPITAL_COMMUNITY)
Admission: EM | Admit: 2022-11-14 | Discharge: 2022-11-15 | Disposition: A | Discharge status: Home / Self Care | Attending: Emergency Medicine | Admitting: Emergency Medicine

## 2022-11-14 DIAGNOSIS — I10 Essential (primary) hypertension: Secondary | ICD-10-CM | POA: Diagnosis not present

## 2022-11-14 DIAGNOSIS — X500XXA Overexertion from strenuous movement or load, initial encounter: Secondary | ICD-10-CM | POA: Diagnosis not present

## 2022-11-14 DIAGNOSIS — S46912A Strain of unspecified muscle, fascia and tendon at shoulder and upper arm level, left arm, initial encounter: Secondary | ICD-10-CM | POA: Insufficient documentation

## 2022-11-14 DIAGNOSIS — Z79899 Other long term (current) drug therapy: Secondary | ICD-10-CM | POA: Diagnosis not present

## 2022-11-14 DIAGNOSIS — Y990 Civilian activity done for income or pay: Secondary | ICD-10-CM | POA: Diagnosis not present

## 2022-11-14 DIAGNOSIS — M25512 Pain in left shoulder: Secondary | ICD-10-CM | POA: Diagnosis present

## 2022-11-15 ENCOUNTER — Emergency Department (HOSPITAL_COMMUNITY)

## 2022-11-15 ENCOUNTER — Other Ambulatory Visit: Payer: Self-pay

## 2022-11-15 MED ORDER — METHOCARBAMOL 500 MG PO TABS: 500.0000 mg | ORAL_TABLET | Freq: Two times a day (BID) | ORAL | 0 refills | Status: DC | PRN

## 2022-11-15 MED ORDER — LIDOCAINE 5 % EX PTCH
1.0000 | MEDICATED_PATCH | CUTANEOUS | Status: DC
  Administered 2022-11-15: 1 via TRANSDERMAL
  Filled 2022-11-15: qty 1

## 2022-11-15 NOTE — ED Provider Notes (Signed)
[place] EMERGENCY DEPARTMENT Provider Note   CSN: [id number] Arrival date & time: 11/14/22  2347     History  Chief Complaint  Patient presents with   Shoulder Injury    Debra Wade is a 51 y.o. female who works at the [employer] he states that on 1120 08/2022 the machine that usually pushes the packages along in their facility was broken so they were having to manually move all of the heavy packages.  She states that during that evening she felt a popping sensation in her left shoulder and has had some soreness since that time.  Treating at home with ibuprofen and Tylenol.  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  No numbness tingling or weakness in the hand.  She is not having trouble with coordination or dropping objects from the hand.  Simply having pain in the left shoulder.  Has been using some Bengay this evening with minimal relief.  I have personally reviewed this patient's medical records.  Has history of hypertension, GERD, stroke.  She is not anticoagulated or on antiplatelet therapy.  HPI     Home Medications Prior to Admission medications   Medication Sig Start Date End Date Taking? Authorizing Provider  methocarbamol (ROBAXIN) 500 MG tablet Take 1 tablet (500 mg total) by mouth 2 (two) times daily as needed for muscle spasms. 11/15/22  Yes Julien Oscar R, PA-C  albuterol (VENTOLIN HFA) 108 (90 Base) MCG/ACT inhaler Inhale 2 puffs into the lungs every 4 (four) hours as needed for wheezing or shortness of breath. 12/08/20   Rodriguez-Southworth, Sunday Spillers, PA-C  atorvastatin (LIPITOR) 40 MG tablet Take 1 tablet (40 mg total) by mouth daily. 08/13/22   Imogene Burn, PA-C  carvedilol (COREG) 25 MG tablet Take 1 tablet (25 mg total) by mouth 2 (two) times daily. 08/12/22   Imogene Burn, PA-C  Cyanocobalamin (VITAMIN B-12 PO) Take by  mouth. Patient not taking: Reported on 08/12/2022    [provider]  pantoprazole (PROTONIX) 40 MG tablet Take 1 tablet (40 mg total) by mouth daily. 08/12/22   Imogene Burn, PA-C  spironolactone (ALDACTONE) 100 MG tablet Take 0.5 tablets (50 mg total) by mouth daily. 08/12/22   Imogene Burn, PA-C      Allergies    Other and Sulfa drugs cross reactors    Review of Systems   Review of Systems  Musculoskeletal:        Shoulder pain    Physical Exam Updated Vital Signs BP (!) 151/104 (BP Location: Right Arm)   Pulse 70   Temp 98.6 F (37 C)   Resp 20   SpO2 98%  Physical Exam Vitals and nursing note reviewed.  Constitutional:      Appearance: She is not ill-appearing or toxic-appearing.  HENT:     Head: Normocephalic and atraumatic.  Eyes:     General: No scleral icterus.       Right eye: No discharge.        Left eye: No discharge.     Conjunctiva/sclera: Conjunctivae normal.  Cardiovascular:     Rate and Rhythm: Normal rate.     Pulses: Normal pulses.  Pulmonary:     Effort: Pulmonary effort is normal.     Breath sounds: Normal breath sounds.  Musculoskeletal:     Right shoulder: Normal.     Left shoulder:  Tenderness and bony tenderness present. No swelling, deformity, effusion, laceration or crepitus. Normal range of motion.     Right upper arm: Normal.     Left upper arm: Normal.     Right elbow: Normal.     Left elbow: Normal.     Right forearm: Normal.     Left forearm: Normal.     Right wrist: Normal.     Left wrist: Normal.     Right hand: Normal.     Left hand: Normal.     Comments: + empty can test, TTP and spasm of the left trapezius and cervical paraspinous musculature, without midline TTP, skin changes, or deformities. FROM of the left shoulder though worsening of pain with pronation of the forearm and with internal rotation  Skin:    General: Skin is warm and dry.     Capillary Refill: Capillary refill takes less than 2 seconds.      Comments: No skin changes  Neurological:     General: No focal deficit present.     Mental Status: She is alert.     Sensory: Sensation is intact.     Motor: Motor function is intact.     Comments: Symmetric 5/5 grip strength in hands bilaterally  Psychiatric:        Mood and Affect: Mood normal.     ED Results / Procedures / Treatments   Labs (all labs ordered are listed, but only abnormal results are displayed) Labs Reviewed - No data to display  EKG None  Radiology DG Shoulder Left  Result Date: 11/15/2022 CLINICAL DATA:  Left arm injury, pain EXAM: LEFT SHOULDER - 2+ VIEW COMPARISON:  None Available. FINDINGS: Normal alignment. No acute fracture or dislocation. Glenohumeral joint space is preserved. Mild acromioclavicular degenerative arthritis with minimal osteophyte formation. Limited evaluation of the left hemithorax is unremarkable. IMPRESSION: 1. Mild acromioclavicular degenerative arthritis. Electronically Signed   By: Fidela Salisbury M.D.   On: 11/15/2022 00:43    Procedures Procedures    Medications Ordered in ED Medications  lidocaine (LIDODERM) 5 % 1 patch (has no administration in time range)    ED Course/ Medical Decision Making/ A&P                           Medical Decision Making 51 year old female with left shoulder pain.   HTN on intake, vs ortherwise normal.  Patient is neurovascular intact in the left shoulder.  Please see musculoskeletal exam as above.  Physical exam very reassuring.  DDx includes limited to acute fracture, ligamentous or tendinous injury, muscular strain, dislocation.  Amount and/or Complexity of Data Reviewed Radiology: ordered.    Details: Plain films visualized with provider negative for acute osseous abnormality of the left shoulder.  Agree with radiology interpretation.   Clinical patient most consistent with acute muscular strain, suspicion of possible rotator cuff injury as well.  Will place patient in sling for comfort,  Lidoderm patch offered in ED.  Will discharge with instructions for topical analgesia, OTC analgesia and muscle relaxer.  Sports medicine follow-up provided.  No further workup warranted in the ED at this time.  Clinical concern for emergent underlying etiology for symptoms that would warrant further ED workup or inpatient management is exceedingly low.  Khandi voiced understanding of her medical evaluation and treatment plan. Each of their questions answered to their expressed satisfaction.  Return precautions were given.  Patient is well-appearing, stable, and was discharged  in good condition.  This chart was dictated using voice recognition software, Dragon. Despite the best efforts of this provider to proofread and correct errors, errors may still occur which can change documentation meaning.   Final Clinical Impression(s) / ED Diagnoses Final diagnoses:  Strain of left shoulder, initial encounter    Rx / DC Orders ED Discharge Orders          Ordered    methocarbamol (ROBAXIN) 500 MG tablet  2 times daily PRN        11/15/22 0130              Yasser Hepp, Gypsy Balsam, PA-C 11/15/22 0140    Fatima Blank, MD 11/15/22 [phone]

## 2022-11-15 NOTE — Discharge Instructions (Signed)
You were seen in the emergency department today for yourshoulder pain.  Your physical exam and vital signs are very reassuring.  The muscles in your left shoulder and upper back are in what is called spasm, meaning they are inappropriately tightened up.  This can be quite painful.  To help with your pain you may take Tylenol and / or NSAID medication (such as ibuprofen or naproxen) to help with your pain.  Additionally, you have been prescribed a muscle relaxer called Robaxin to help relieve some of the muscle spasm.  Please be advised that this medication may make you very sleepy, so you should not drive or operate heavy machinery while you are taking it.  You may also utilize topical pain relief such as Biofreeze, IcyHot, or topical lidocaine patches.  I also recommend that you apply heat to the area, such as a hot shower or heating pad, and follow heat application with massage of the muscles that are most tight.  Please return to the emergency department if you develop any numbness/tingling/weakness in your arms or legs, any difficulty urinating, or urinary incontinence chest pain, shortness of breath, abdominal pain, nausea or vomiting that does not stop, or any other new severe symptoms.

## 2022-11-15 NOTE — ED Triage Notes (Signed)
Pt presents with continued left shoulder pain after injury at work at the end of November.  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

## 2022-11-20 ENCOUNTER — Telehealth: Payer: Self-pay

## 2022-11-20 NOTE — Telephone Encounter (Signed)
Transition Care Management Follow-up Telephone Call Date of discharge and from where: [facility] 11/14/22 How have you been since you were released from the hospital? [facility] still having shoulder discomfort and working light duty. Any questions or concerns? No  Items Reviewed: Did the pt receive and understand the discharge instructions provided? Yes  Medications obtained and verified? Yes  Other? No  Any new allergies since your discharge? No  Dietary orders reviewed? No Do you have support at home? Yes   Home Care and Equipment/Supplies: Were home health services ordered? no If so, what is the name of the agency? N/A  Has the agency set up a time to come to the patient's home? not applicable Were any new equipment or medical supplies ordered?  No What is the name of the medical supply agency? N/A Were you able to get the supplies/equipment? not applicable Do you have any questions related to the use of the equipment or supplies? No  Functional Questionnaire: (I = Independent and D = Dependent) ADLs: I  Bathing/Dressing- I  Meal Prep- I  Eating- I  Maintaining continence- I  Transferring/Ambulation- I  Managing Meds- I  Follow up appointments reviewed:  PCP Hospital f/u appt confirmed? Yes  Scheduled to see Dr. Quay Burow on 11/24/2022 @ 1:00 pm.. [facility] f/u appt confirmed? No  Are transportation arrangements needed? No  If their condition worsens, is the pt aware to call PCP or go to the Emergency Dept.? Yes Was the patient provided with contact information for the PCP's office or ED? Yes Was to pt encouraged to call back with questions or concerns? Yes

## 2022-11-24 ENCOUNTER — Encounter: Payer: Self-pay | Admitting: Internal Medicine

## 2022-11-24 ENCOUNTER — Encounter: Payer: 59 | Admitting: Internal Medicine

## 2022-11-24 NOTE — Patient Instructions (Addendum)
Blood work was ordered.   The lab is on the first floor.    Medications changes include :       A referral was ordered for XXX.     Someone will call you to schedule an appointment.    Return in about 1 year (around 11/25/2023) for Physical Exam.   Health Maintenance, Female Adopting a healthy lifestyle and getting preventive care are important in promoting health and wellness. Ask your health care provider about: The right schedule for you to have regular tests and exams. Things you can do on your own to prevent diseases and keep yourself healthy. What should I know about diet, weight, and exercise? Eat a healthy diet  Eat a diet that includes plenty of vegetables, fruits, low-fat dairy products, and lean protein. Do not eat a lot of foods that are high in solid fats, added sugars, or sodium. Maintain a healthy weight Body mass index (BMI) is used to identify weight problems. It estimates body fat based on height and weight. Your health care provider can help determine your BMI and help you achieve or maintain a healthy weight. Get regular exercise Get regular exercise. This is one of the most important things you can do for your health. Most adults should: Exercise for at least 150 minutes each week. The exercise should increase your heart rate and make you sweat (moderate-intensity exercise). Do strengthening exercises at least twice a week. This is in addition to the moderate-intensity exercise. Spend less time sitting. Even light physical activity can be beneficial. Watch cholesterol and blood lipids Have your blood tested for lipids and cholesterol at 49 years of age, then have this test every 5 years. Have your cholesterol levels checked more often if: Your lipid or cholesterol levels are high. You are older than 49 years of age. You are at high risk for heart disease. What should I know about cancer screening? Depending on your health history and family history,  you may need to have cancer screening at various ages. This may include screening for: Breast cancer. Cervical cancer. Colorectal cancer. Skin cancer. Lung cancer. What should I know about heart disease, diabetes, and high blood pressure? Blood pressure and heart disease High blood pressure causes heart disease and increases the risk of stroke. This is more likely to develop in people who have high blood pressure readings or are overweight. Have your blood pressure checked: Every 3-5 years if you are 25-46 years of age. Every year if you are 34 years old or older. Diabetes Have regular diabetes screenings. This checks your fasting blood sugar level. Have the screening done: Once every three years after age 59 if you are at a normal weight and have a low risk for diabetes. More often and at a younger age if you are overweight or have a high risk for diabetes. What should I know about preventing infection? Hepatitis B If you have a higher risk for hepatitis B, you should be screened for this virus. Talk with your health care provider to find out if you are at risk for hepatitis B infection. Hepatitis C Testing is recommended for: Everyone born from [birth year] through 1963. Anyone with known risk factors for hepatitis C. Sexually transmitted infections (STIs) Get screened for STIs, including gonorrhea and chlamydia, if: You are sexually active and are younger than 49 years of age. You are older than 49 years of age and your health care provider tells you that you are at  increased risk for chlamydia or gonorrhea. Ask your health care provider if you are at risk. Ask your health care provider about whether you are at high risk for HIV. Your health care provider may recommend a prescription medicine to help prevent HIV infection. If you choose to take medicine to prevent HIV, you should first get tested for HIV. You should then be tested every 3 months for as long as you  are taking the medicine. Pregnancy If you are about to stop having your period (premenopausal) and you may become pregnant, seek counseling before you get pregnant. Take 400 to 800 micrograms (mcg) of folic acid every day if you become pregnant. Ask for birth control (contraception) if you want to prevent pregnancy. Osteoporosis and menopause Osteoporosis is a disease in which the bones lose minerals and strength with aging. This can result in bone fractures. If you are 65 years old or older, or if you are at risk for osteoporosis and fractures, ask your health care provider if you should: Be screened for bone loss. Take a calcium or vitamin D supplement to lower your risk of fractures. Be given hormone replacement therapy (HRT) to treat symptoms of menopause. Follow these instructions at home: Alcohol use Do not drink alcohol if: Your health care provider tells you not to drink. You are pregnant, may be pregnant, or are planning to become pregnant. If you drink alcohol: Limit how much you have to: 0-1 drink a day. Know how much alcohol is in your drink. In the [place] one drink equals one 12 oz bottle of beer (355 mL), one 5 oz glass of wine (148 mL), or one 1 oz glass of hard liquor (44 mL). Lifestyle Do not use any products that contain nicotine or tobacco. These products include cigarettes, chewing tobacco, and vaping devices, such as e-cigarettes. If you need help quitting, ask your health care provider. Do not use street drugs. Do not share needles. Ask your health care provider for help if you need support or information about quitting drugs. General instructions Schedule regular health, dental, and eye exams. Stay current with your vaccines. Tell your health care provider if: You often feel depressed. You have ever been abused or do not feel safe at home. Summary Adopting a healthy lifestyle and getting preventive care are important in promoting health and wellness. Follow your  health care provider's instructions about healthy diet, exercising, and getting tested or screened for diseases. Follow your health care provider's instructions on monitoring your cholesterol and blood pressure. This information is not intended to replace advice given to you by your health care provider. Make sure you discuss any questions you have with your health care provider. Document Revised: 04/15/2021 Document Reviewed: 04/15/2021 Elsevier Patient Education  2023 [employer]  

## 2022-11-24 NOTE — Progress Notes (Signed)
Subjective:    Patient ID: Debra Wade, female    DOB: [birth year], 51 y.o.   MRN: [id number]     HPI Debra Wade is here for follow up from the ED and follow up/physical exam  ED 12/8 for shoulder injury.  She was moving heavy packages at work and hurt her left shoulder.  She heard a popping sensation.  She was evaluated in the ED-shoulder x-ray showed mild arthritis.  They diagnosed her with a strain and prescribed methocarbamol 500 mg twice daily as needed.  She has not been seen in over a year and is here for follow-up of her routine medical problems-hypertension, GERD, hyperglycemia  Medications and allergies reviewed with patient and updated if appropriate.  Current Outpatient Medications on File Prior to Visit  Medication Sig Dispense Refill  . albuterol (VENTOLIN HFA) 108 (90 Base) MCG/ACT inhaler Inhale 2 puffs into the lungs every 4 (four) hours as needed for wheezing or shortness of breath. 18 g 0  . atorvastatin (LIPITOR) 40 MG tablet Take 1 tablet (40 mg total) by mouth daily. 90 tablet 3  . carvedilol (COREG) 25 MG tablet Take 1 tablet (25 mg total) by mouth 2 (two) times daily. 180 tablet 3  . Cyanocobalamin (VITAMIN B-12 PO) Take by mouth. (Patient not taking: Reported on 08/12/2022)    . methocarbamol (ROBAXIN) 500 MG tablet Take 1 tablet (500 mg total) by mouth 2 (two) times daily as needed for muscle spasms. 20 tablet 0  . pantoprazole (PROTONIX) 40 MG tablet Take 1 tablet (40 mg total) by mouth daily. 90 tablet 3  . spironolactone (ALDACTONE) 100 MG tablet Take 0.5 tablets (50 mg total) by mouth daily. 45 tablet 3   No current facility-administered medications on file prior to visit.     Review of Systems     Objective:  There were no vitals filed for this visit. BP Readings from Last 3 Encounters:  11/14/22 (!) 151/104  09/03/22 (!) 154/93  08/12/22 118/78   Wt Readings from Last 3 Encounters:  08/12/22 184 lb (83.5 kg)  05/09/21 184 lb 9.6 oz (83.7 kg)   04/24/21 183 lb (83 kg)   There is no height or weight on file to calculate BMI.    Physical Exam     Lab Results  Component Value Date   WBC 6.5 08/12/2022   HGB 14.4 08/12/2022   HCT 42.6 08/12/2022   PLT 247 08/12/2022   GLUCOSE 96 08/12/2022   CHOL 226 (H) 08/12/2022   TRIG 112 08/12/2022   HDL 44 08/12/2022   LDLCALC 162 (H) 08/12/2022   ALT 9 08/12/2022   AST 13 08/12/2022   NA 142 08/12/2022   K 4.1 08/12/2022   CL 105 08/12/2022   CREATININE 1.20 (H) 08/12/2022   BUN 11 08/12/2022   CO2 23 08/12/2022   TSH 2.770 08/12/2022   INR 0.94 09/09/2016   HGBA1C 6.0 04/24/2021     Assessment & Plan:   Health Maintenance  Topic Date Due  . HIV Screening  Never done  . MAMMOGRAM  Never done  . Hepatitis C Screening  Never done  . PAP SMEAR-Modifier  Never done  . COLONOSCOPY (Pts 45-14yr Insurance coverage will need to be confirmed)  Never done  . COVID-19 Vaccine (3 - Moderna risk series) 03/14/2020  . INFLUENZA VACCINE  07/08/2022  . DTaP/Tdap/Td (2 - Td or Tdap) 04/27/2031  . HPV VACCINES  Aged Out     Physical  exam: Screening blood work  ordered Exercise   Weight   Substance abuse  none   Reviewed recommended immunizations.   See Problem List for Assessment and Plan of chronic medical problems.    This encounter was created in error - please disregard.

## 2022-12-15 ENCOUNTER — Other Ambulatory Visit: Payer: Self-pay

## 2022-12-15 MED ORDER — PANTOPRAZOLE SODIUM 40 MG PO TBEC: 40.0000 mg | DELAYED_RELEASE_TABLET | Freq: Every day | ORAL | 3 refills | Status: DC

## 2022-12-15 NOTE — Telephone Encounter (Signed)
Pt's medication was sent to pt's pharmacy as requested. Confirmation received.  °

## 2022-12-24 ENCOUNTER — Other Ambulatory Visit: Payer: Self-pay | Admitting: Orthopedic Surgery

## 2022-12-24 DIAGNOSIS — M75102 Unspecified rotator cuff tear or rupture of left shoulder, not specified as traumatic: Secondary | ICD-10-CM

## 2023-01-04 ENCOUNTER — Ambulatory Visit
Admission: RE | Admit: 2023-01-04 | Discharge: 2023-01-04 | Disposition: A | Discharge status: Home / Self Care | Payer: 59 | Source: Ambulatory Visit | Attending: Orthopedic Surgery | Admitting: Orthopedic Surgery

## 2023-01-04 DIAGNOSIS — M75102 Unspecified rotator cuff tear or rupture of left shoulder, not specified as traumatic: Secondary | ICD-10-CM

## 2023-03-16 ENCOUNTER — Other Ambulatory Visit: Payer: Self-pay | Admitting: *Deleted

## 2023-03-16 MED ORDER — PANTOPRAZOLE SODIUM 40 MG PO TBEC: 40.0000 mg | DELAYED_RELEASE_TABLET | Freq: Every day | ORAL | 3 refills | Status: DC

## 2023-03-18 ENCOUNTER — Other Ambulatory Visit: Payer: Self-pay

## 2023-03-18 MED ORDER — PANTOPRAZOLE SODIUM 40 MG PO TBEC: 40.0000 mg | DELAYED_RELEASE_TABLET | Freq: Every day | ORAL | 1 refills | Status: DC

## 2023-04-25 ENCOUNTER — Ambulatory Visit (HOSPITAL_COMMUNITY)
Admission: EM | Admit: 2023-04-25 | Discharge: 2023-04-25 | Disposition: A | Discharge status: Home / Self Care | Payer: POS | Attending: Physician Assistant | Admitting: Physician Assistant

## 2023-04-25 ENCOUNTER — Encounter (HOSPITAL_COMMUNITY): Payer: Self-pay

## 2023-04-25 DIAGNOSIS — R0989 Other specified symptoms and signs involving the circulatory and respiratory systems: Secondary | ICD-10-CM

## 2023-04-25 MED ORDER — AZITHROMYCIN 250 MG PO TABS: ORAL_TABLET | ORAL | 0 refills | Status: DC

## 2023-04-25 NOTE — ED Provider Notes (Signed)
[facility]   MRN: [id number] DOB: 26-Aug-1971  Subjective:   Debra Wade is a 52 y.o. female ever day smoker presenting for cough and congestion for the last 5 to 6 days.  She is here with her fianc.  She denies any fever or chills.  No chest pain or shortness of breath.  No headache or dizziness.  No other symptoms to report today.  She has been taking Coricidin, Robitussin, Tylenol, Mucinex, occasional Flonase and Claritin.  Feels like symptoms have settled into her chest.  She has not yet taken her blood pressure medicine today.  No current facility-administered medications for this encounter.  Current Outpatient Medications:    atorvastatin (LIPITOR) 40 MG tablet, Take 1 tablet (40 mg total) by mouth daily., Disp: 90 tablet, Rfl: 3   azithromycin (ZITHROMAX Z-PAK) 250 MG tablet, Take two tablets on day one, followed by one tablet daily for the next four days., Disp: 6 tablet, Rfl: 0   carvedilol (COREG) 25 MG tablet, Take 1 tablet (25 mg total) by mouth 2 (two) times daily., Disp: 180 tablet, Rfl: 3   pantoprazole (PROTONIX) 40 MG tablet, Take 1 tablet (40 mg total) by mouth daily., Disp: 90 tablet, Rfl: 1   spironolactone (ALDACTONE) 100 MG tablet, Take 0.5 tablets (50 mg total) by mouth daily., Disp: 45 tablet, Rfl: 3   albuterol (VENTOLIN HFA) 108 (90 Base) MCG/ACT inhaler, Inhale 2 puffs into the lungs every 4 (four) hours as needed for wheezing or shortness of breath., Disp: 18 g, Rfl: 0   Cyanocobalamin (VITAMIN B-12 PO), Take by mouth. (Patient not taking: Reported on 08/12/2022), Disp: , Rfl:    methocarbamol (ROBAXIN) 500 MG tablet, Take 1 tablet (500 mg total) by mouth 2 (two) times daily as needed for muscle spasms., Disp: 20 tablet, Rfl: 0   Allergies  Allergen Reactions   Other Rash and Swelling   Sulfa Drugs Cross Reactors Anaphylaxis, Swelling and Rash   Prednisone Swelling    Past Medical History:  Diagnosis Date   Arthritis    Chicken pox     Frequent headaches    GERD (gastroesophageal reflux disease)    Hypertension    Stroke (HCC)    Syncope      Past Surgical History:  Procedure Laterality Date   TUBAL LIGATION      Family History  Problem Relation Age of Onset   Hypertension Mother    Hypertension Father    Heart disease Father    Breast cancer Maternal Grandmother    Prostate cancer Paternal Grandfather     Social History   Tobacco Use   Smoking status: Every Day    Packs/day: 0.30    Years: 24.00    Additional pack years: 0.00    Total pack years: 7.20    Types: Cigarettes   Smokeless tobacco: Never   Tobacco comments:    about 8 cigarettes per day  Vaping Use   Vaping Use: Never used  Substance Use Topics   Alcohol use: Yes    Comment: occ.    Drug use: No    ROS REFER TO HPI FOR PERTINENT POSITIVES AND NEGATIVES   Objective:   Vitals: BP (!) 159/98 (BP Location: Left Arm)   Pulse 77   Temp 98.4 F (36.9 C) (Oral)   Resp 16   Ht 5\' 9"  (1.753 m)   Wt 172 lb (78 kg)   LMP 08/12/2022   SpO2 96%   BMI  25.40 kg/m   Physical Exam Vitals and nursing note reviewed.  Constitutional:      General: She is not in acute distress.    Appearance: Normal appearance. She is not ill-appearing.  HENT:     Head: Normocephalic.     Right Ear: Tympanic membrane, ear canal and external ear normal.     Left Ear: Tympanic membrane, ear canal and external ear normal.     Nose: Congestion present.     Mouth/Throat:     Mouth: Mucous membranes are moist.     Pharynx: No oropharyngeal exudate or posterior oropharyngeal erythema.  Eyes:     Extraocular Movements: Extraocular movements intact.     Conjunctiva/sclera: Conjunctivae normal.     Pupils: Pupils are equal, round, and reactive to light.  Cardiovascular:     Rate and Rhythm: Normal rate and regular rhythm.     Pulses: Normal pulses.     Heart sounds: Normal heart sounds. No murmur heard. Pulmonary:     Effort: Pulmonary effort is normal.  No respiratory distress.     Breath sounds: Normal breath sounds. No wheezing.  Musculoskeletal:     Cervical back: Normal range of motion.  Skin:    General: Skin is warm.  Neurological:     Mental Status: She is alert and oriented to person, place, and time.  Psychiatric:        Mood and Affect: Mood normal.        Behavior: Behavior normal.     No results found for this or any previous visit (from the past 24 hour(s)).  Assessment and Plan :   PDMP not reviewed this encounter.  1. Chest congestion    Persistent symptoms despite conservative efforts at home. Will Rx Z-pak at this time, take with food. Cautioned on antibiotic use and possible side effects. Advised nasal saline, humidifier, Flonase, Mucinex, and pushing fluids. Return to care precautions discussed.  Patient is understanding and agreeable with plan.      AllwardtCrist Infante, PA-C 04/25/23 1042

## 2023-04-25 NOTE — Discharge Instructions (Signed)
Good to meet you today.  Please continue daily allergy medicine, nasal saline, fluids.  Take the Z-Pak as directed for added relief.  Follow-up with your primary care if not improving.  Emergency department if acutely worsening symptoms.  Feel better soon.

## 2023-04-25 NOTE — ED Triage Notes (Signed)
Patient here today with c/o cough, chest congestion, SOB, wheeze, ST since Monday. She has been taking Coricidin, Robitussin, Tylenol, and drinking a lot of fluids with a little improvement. Her cousin that lives with her has a cold but she hasn't been around him much. She also works for the [employer]. No recent travel.

## 2023-06-30 ENCOUNTER — Encounter (HOSPITAL_COMMUNITY): Payer: Self-pay

## 2023-06-30 ENCOUNTER — Emergency Department (HOSPITAL_COMMUNITY): Payer: Commercial Managed Care - PPO

## 2023-06-30 ENCOUNTER — Other Ambulatory Visit: Payer: Self-pay

## 2023-06-30 ENCOUNTER — Emergency Department (HOSPITAL_COMMUNITY)
Admission: EM | Admit: 2023-06-30 | Discharge: 2023-06-30 | Disposition: A | Discharge status: Home / Self Care | Payer: Commercial Managed Care - PPO | Attending: Emergency Medicine | Admitting: Emergency Medicine

## 2023-06-30 DIAGNOSIS — M170 Bilateral primary osteoarthritis of knee: Secondary | ICD-10-CM | POA: Diagnosis not present

## 2023-06-30 DIAGNOSIS — M1990 Unspecified osteoarthritis, unspecified site: Secondary | ICD-10-CM

## 2023-06-30 DIAGNOSIS — M25561 Pain in right knee: Secondary | ICD-10-CM | POA: Diagnosis present

## 2023-06-30 MED ORDER — PREDNISONE 20 MG PO TABS: ORAL_TABLET | ORAL | 0 refills | Status: AC

## 2023-06-30 MED ORDER — PREDNISONE 20 MG PO TABS
60.0000 mg | ORAL_TABLET | Freq: Once | ORAL | Status: AC
  Administered 2023-06-30: 60 mg via ORAL
  Filled 2023-06-30: qty 3

## 2023-06-30 NOTE — ED Triage Notes (Signed)
Pt arrived from home via POV c/o fluid being on bilateral knees. States that it has accumulated over the last 2 to 3 weeks. 8/10 pain scale.

## 2023-06-30 NOTE — ED Notes (Signed)
Ace wrap applied to Lt Knee as ordered

## 2023-06-30 NOTE — ED Provider Notes (Signed)
[facility] AT [facility] Provider Note   CSN: [id number] Arrival date & time: 06/30/23  0210     History  Chief Complaint  Patient presents with   Knee Pain    Debra Wade is a 52 y.o. female.  52 year old female with a history of arthritis in her left shoulder presents the ER today secondary to bilateral knee pain and swelling over the last month or so.  Patient states that she started prednisone for her shoulder and seem to make her knees swell.  Shoulder seems to be doing better but now her knees hurt.  No trauma, fever, history of gout, redness or leg swelling.  No history of DVTs.   Knee Pain      Home Medications Prior to Admission medications   Medication Sig Start Date End Date Taking? Authorizing Provider  predniSONE (DELTASONE) 20 MG tablet Take 3 tablets (60 mg total) by mouth daily with breakfast for 2 days, THEN 2 tablets (40 mg total) daily with breakfast for 2 days, THEN 1 tablet (20 mg total) daily with breakfast for 2 days, THEN 0.5 tablets (10 mg total) daily with breakfast for 2 days. 06/30/23 07/08/23 Yes Heliodoro Domagalski, Barbara Cower, MD  albuterol (VENTOLIN HFA) 108 (90 Base) MCG/ACT inhaler Inhale 2 puffs into the lungs every 4 (four) hours as needed for wheezing or shortness of breath. 12/08/20   Rodriguez-Southworth, Nettie Elm, PA-C  atorvastatin (LIPITOR) 40 MG tablet Take 1 tablet (40 mg total) by mouth daily. 08/13/22   Dyann Kief, PA-C  azithromycin (ZITHROMAX Z-PAK) 250 MG tablet Take two tablets on day one, followed by one tablet daily for the next four days. 04/25/23   Allwardt, Crist Infante, PA-C  carvedilol (COREG) 25 MG tablet Take 1 tablet (25 mg total) by mouth 2 (two) times daily. 08/12/22   Dyann Kief, PA-C  Cyanocobalamin (VITAMIN B-12 PO) Take by mouth. Patient not taking: Reported on 08/12/2022    [provider]  methocarbamol (ROBAXIN) 500 MG tablet Take 1 tablet (500 mg total) by mouth 2 (two) times daily as needed  for muscle spasms. 11/15/22   Sponseller, Lupe Carney R, PA-C  pantoprazole (PROTONIX) 40 MG tablet Take 1 tablet (40 mg total) by mouth daily. 03/18/23   Quintella Reichert, MD  spironolactone (ALDACTONE) 100 MG tablet Take 0.5 tablets (50 mg total) by mouth daily. 08/12/22   Dyann Kief, PA-C      Allergies    Other, Sulfa drugs cross reactors, and Prednisone    Review of Systems   Review of Systems  Physical Exam Updated Vital Signs BP (!) 150/106   Pulse 71   Temp 97.8 F (36.6 C) (Oral)   Resp 16   LMP 08/12/2022   SpO2 99%  Physical Exam Vitals and nursing note reviewed.  Constitutional:      Appearance: She is well-developed.  HENT:     Head: Normocephalic and atraumatic.  Cardiovascular:     Rate and Rhythm: Normal rate and regular rhythm.  Pulmonary:     Effort: No respiratory distress.     Breath sounds: No stridor.  Abdominal:     General: There is no distension.  Musculoskeletal:     Cervical back: Normal range of motion.     Comments: Left greater than right knee effusion, no erythema, warmth or pain with range of motion.  Stable to AP and lateral forces.  Neurological:     Mental Status: She is alert.  ED Results / Procedures / Treatments   Labs (all labs ordered are listed, but only abnormal results are displayed) Labs Reviewed - No data to display  EKG None  Radiology DG Knee 2 Views Right  Result Date: 06/30/2023 CLINICAL DATA:  Bilateral knee pain EXAM: RIGHT KNEE - 1-2 VIEW COMPARISON:  03/19/2017 FINDINGS: Mild tricompartment degenerative changes with joint space narrowing and spurring, similar to prior study. No significant joint effusion. No acute bony abnormality. Specifically, no fracture, subluxation, or dislocation. IMPRESSION: Mild degenerative changes.  No acute bony abnormality. Electronically Signed   By: Charlett Nose M.D.   On: 06/30/2023 03:30   DG Knee 2 Views Left  Result Date: 06/30/2023 CLINICAL DATA:  Bilateral knee pain EXAM:  LEFT KNEE - 1-2 VIEW COMPARISON:  None Available. FINDINGS: Moderate tricompartment degenerative changes with joint space narrowing and spurring. Small joint effusion. No acute bony abnormality. Specifically, no fracture, subluxation, or dislocation. IMPRESSION: Moderate tricompartment degenerative changes with small joint effusion. No acute bony abnormality. Electronically Signed   By: Charlett Nose M.D.   On: 06/30/2023 03:29    Procedures Procedures    Medications Ordered in ED Medications  predniSONE (DELTASONE) tablet 60 mg (60 mg Oral Given 06/30/23 0444)    ED Course/ Medical Decision Making/ A&P                             Medical Decision Making Amount and/or Complexity of Data Reviewed Radiology: ordered.  Risk Prescription drug management.   X-ray without any concerning findings aside from arthritis on my interpretation.  Doubt septic arthritis or gouty arthritis without any associated symptoms such as fever, redness, history of the same or recent illnesses.  No trauma to suggest an occult fracture requiring further imaging.  She already has a sleeve for 1 side will Ace wrap the other.  Suggested ice and prednisone.  She cannot take anti-inflammatories secondary to chronic kidney disease.  She is willing to try the prednisone again.  She will follow-up with Dr. Wyline Mood for consideration of an arthrocentesis versus steroid injections.   Final Clinical Impression(s) / ED Diagnoses Final diagnoses:  Arthritis    Rx / DC Orders ED Discharge Orders          Ordered    predniSONE (DELTASONE) 20 MG tablet  Q breakfast        06/30/23 0457              Eivan Gallina, Barbara Cower, MD 06/30/23 0502

## 2023-07-01 ENCOUNTER — Telehealth: Payer: Self-pay

## 2023-07-01 NOTE — Transitions of Care (Post Inpatient/ED Visit) (Signed)
07/01/2023  Name: Debra Wade MRN: [id number] DOB: Sep 22, 1971  Today's TOC FU Call Status: Today's TOC FU Call Status:: Successful TOC FU Call Competed TOC FU Call Complete Date: 07/01/23  Transition Care Management Follow-up Telephone Call Date of Discharge: 06/30/23 Discharge Facility: [name]) Type of Discharge: Emergency Department Reason for ED Visit: Other: (osteoarthritis) How have you been since you were released from the hospital?: Same Any questions or concerns?: No  Items Reviewed: Did you receive and understand the discharge instructions provided?: No Medications obtained,verified, and reconciled?: Yes (Medications Reviewed) Any new allergies since your discharge?: No Dietary orders reviewed?: NA Do you have support at home?: No  Medications Reviewed Today: Medications Reviewed Today     Reviewed by Karena Addison, LPN (Licensed Practical Nurse) on 07/01/23 at 1057  Med List Status: <None>   Medication Order Taking? Sig Documenting Provider Last Dose Status Informant  albuterol (VENTOLIN HFA) 108 (90 Base) MCG/ACT inhaler [id number] Yes Inhale 2 puffs into the lungs every 4 (four) hours as needed for wheezing or shortness of breath. Rodriguez-Southworth, Nettie Elm, PA-C Taking Active   atorvastatin (LIPITOR) 40 MG tablet [id number] Yes Take 1 tablet (40 mg total) by mouth daily. Dyann Kief, PA-C Taking Active   azithromycin (ZITHROMAX Z-PAK) 250 MG tablet [id number] No Take two tablets on day one, followed by one tablet daily for the next four days.  Patient not taking: Reported on 07/01/2023   Allwardt, Crist Infante, PA-C Not Taking Active   carvedilol (COREG) 25 MG tablet [id number] Yes Take 1 tablet (25 mg total) by mouth 2 (two) times daily. Dyann Kief, PA-C Taking Active   Cyanocobalamin (VITAMIN B-12 PO) [id number] No Take by mouth.  Patient not taking: Reported on 08/12/2022   [provider] Not Taking Active   methocarbamol (ROBAXIN) 500  MG tablet [id number] No Take 1 tablet (500 mg total) by mouth 2 (two) times daily as needed for muscle spasms.  Patient not taking: Reported on 07/01/2023   Paris Lore, PA-C Not Taking Active   pantoprazole (PROTONIX) 40 MG tablet [id number]  Take 1 tablet (40 mg total) by mouth daily. Quintella Reichert, MD  Active   predniSONE (DELTASONE) 20 MG tablet [id number] Yes Take 3 tablets (60 mg total) by mouth daily with breakfast for 2 days, THEN 2 tablets (40 mg total) daily with breakfast for 2 days, THEN 1 tablet (20 mg total) daily with breakfast for 2 days, THEN 0.5 tablets (10 mg total) daily with breakfast for 2 days. Mesner, Barbara Cower, MD Taking Active   spironolactone (ALDACTONE) 100 MG tablet [id number] Yes Take 0.5 tablets (50 mg total) by mouth daily. Dyann Kief, PA-C Taking Active             Home Care and Equipment/Supplies: Were Home Health Services Ordered?: NA Any new equipment or medical supplies ordered?: NA  Functional Questionnaire: Do you need assistance with bathing/showering or dressing?: No Do you need assistance with meal preparation?: No Do you need assistance with eating?: No Do you have difficulty maintaining continence: No Do you need assistance with getting out of bed/getting out of a chair/moving?: No Do you have difficulty managing or taking your medications?: No  Follow up appointments reviewed: PCP Follow-up appointment confirmed?: Yes Date of PCP follow-up appointment?: 07/16/23 Follow-up Provider: [facility] Follow-up appointment confirmed?: Yes Date of Specialist follow-up appointment?: 07/03/23 Follow-Up Specialty Provider:: ortho Reason Specialist Follow-Up Not Confirmed: Patient has Specialist Provider Number and will Call for  Appointment Do you need transportation to your follow-up appointment?: No Do you understand care options if your condition(s) worsen?: Yes-patient verbalized understanding    SIGNATURE Karena Addison, LPN [facility] Nurse Health Advisor Direct Dial [phone]

## 2023-07-15 ENCOUNTER — Encounter: Payer: Self-pay | Admitting: Internal Medicine

## 2023-07-15 DIAGNOSIS — M170 Bilateral primary osteoarthritis of knee: Secondary | ICD-10-CM | POA: Insufficient documentation

## 2023-07-15 NOTE — Progress Notes (Deleted)
      Subjective:    Patient ID: Debra Wade, female    DOB: 1971-06-23, 52 y.o.   MRN: [id number]     HPI Debra Wade is here for follow up for ED  7/23   - went for knee pain  - had b/l knee pain and swelling x 1 month.   She has arthritis in shoulder and started prednisone for it and it seemed to make her knees swell.   Shoulders are better.    No trauma, fever, h/o gout, redness, lower leg swelling or h/o DVT.   Xrays done - moderate OA L knee, mild OA R knee  Prescribed prednisone taper - 60 mg x 2d, 40 mg x 2 d, 20 mg x 2 d, 10 mg x 2 d  Medications and allergies reviewed with patient and updated if appropriate.  Current Outpatient Medications on File Prior to Visit  Medication Sig Dispense Refill   albuterol (VENTOLIN HFA) 108 (90 Base) MCG/ACT inhaler Inhale 2 puffs into the lungs every 4 (four) hours as needed for wheezing or shortness of breath. 18 g 0   atorvastatin (LIPITOR) 40 MG tablet Take 1 tablet (40 mg total) by mouth daily. 90 tablet 3   azithromycin (ZITHROMAX Z-PAK) 250 MG tablet Take two tablets on day one, followed by one tablet daily for the next four days. (Patient not taking: Reported on 07/01/2023) 6 tablet 0   carvedilol (COREG) 25 MG tablet Take 1 tablet (25 mg total) by mouth 2 (two) times daily. 180 tablet 3   Cyanocobalamin (VITAMIN B-12 PO) Take by mouth. (Patient not taking: Reported on 08/12/2022)     methocarbamol (ROBAXIN) 500 MG tablet Take 1 tablet (500 mg total) by mouth 2 (two) times daily as needed for muscle spasms. (Patient not taking: Reported on 07/01/2023) 20 tablet 0   pantoprazole (PROTONIX) 40 MG tablet Take 1 tablet (40 mg total) by mouth daily. 90 tablet 1   spironolactone (ALDACTONE) 100 MG tablet Take 0.5 tablets (50 mg total) by mouth daily. 45 tablet 3   No current facility-administered medications on file prior to visit.     Review of Systems     Objective:  There were no vitals filed for this visit. BP Readings from Last 3  Encounters:  06/30/23 (!) 150/106  04/25/23 (!) 159/98  11/14/22 (!) 151/104   Wt Readings from Last 3 Encounters:  04/25/23 172 lb (78 kg)  08/12/22 184 lb (83.5 kg)  05/09/21 184 lb 9.6 oz (83.7 kg)   There is no height or weight on file to calculate BMI.    Physical Exam     Lab Results  Component Value Date   WBC 6.5 08/12/2022   HGB 14.4 08/12/2022   HCT 42.6 08/12/2022   PLT 247 08/12/2022   GLUCOSE 96 08/12/2022   CHOL 226 (H) 08/12/2022   TRIG 112 08/12/2022   HDL 44 08/12/2022   LDLCALC 162 (H) 08/12/2022   ALT 9 08/12/2022   AST 13 08/12/2022   NA 142 08/12/2022   K 4.1 08/12/2022   CL 105 08/12/2022   CREATININE 1.20 (H) 08/12/2022   BUN 11 08/12/2022   CO2 23 08/12/2022   TSH 2.770 08/12/2022   INR 0.94 09/09/2016   HGBA1C 6.0 04/24/2021     Assessment & Plan:    See Problem List for Assessment and Plan of chronic medical problems.

## 2023-07-16 ENCOUNTER — Ambulatory Visit: Payer: POS | Admitting: Internal Medicine

## 2023-07-16 DIAGNOSIS — M170 Bilateral primary osteoarthritis of knee: Secondary | ICD-10-CM

## 2023-07-27 ENCOUNTER — Encounter: Payer: Self-pay | Admitting: Internal Medicine

## 2023-07-27 NOTE — Patient Instructions (Incomplete)
      We will do your FMLA paperwork    Medications changes include :   none

## 2023-07-27 NOTE — Progress Notes (Unsigned)
      Subjective:    Patient ID: Debra Wade, female    DOB: 1971-01-02, 52 y.o.   MRN: [id number]     HPI Debra Wade is here for follow up from the ED   ED Knee pain 7/23 - presented with b/l knee pain, swelling over the last month.  No trauma. Xray - Left knee is moderate and right is mild.   Was taking prednisone for left shoulder pain and felt the prednisone was causing her knees to swell. Shoulder was better.    Rx'd prednisone 60 mg x 2 days, 40 mg x 2 days, 20 mg x 2 days, 10 mg x 2 days      Medications and allergies reviewed with patient and updated if appropriate.  Current Outpatient Medications on File Prior to Visit  Medication Sig Dispense Refill   albuterol (VENTOLIN HFA) 108 (90 Base) MCG/ACT inhaler Inhale 2 puffs into the lungs every 4 (four) hours as needed for wheezing or shortness of breath. 18 g 0   atorvastatin (LIPITOR) 40 MG tablet Take 1 tablet (40 mg total) by mouth daily. 90 tablet 3   azithromycin (ZITHROMAX Z-PAK) 250 MG tablet Take two tablets on day one, followed by one tablet daily for the next four days. (Patient not taking: Reported on 07/01/2023) 6 tablet 0   carvedilol (COREG) 25 MG tablet Take 1 tablet (25 mg total) by mouth 2 (two) times daily. 180 tablet 3   Cyanocobalamin (VITAMIN B-12 PO) Take by mouth. (Patient not taking: Reported on 08/12/2022)     methocarbamol (ROBAXIN) 500 MG tablet Take 1 tablet (500 mg total) by mouth 2 (two) times daily as needed for muscle spasms. (Patient not taking: Reported on 07/01/2023) 20 tablet 0   pantoprazole (PROTONIX) 40 MG tablet Take 1 tablet (40 mg total) by mouth daily. 90 tablet 1   spironolactone (ALDACTONE) 100 MG tablet Take 0.5 tablets (50 mg total) by mouth daily. 45 tablet 3   No current facility-administered medications on file prior to visit.     Review of Systems     Objective:  There were no vitals filed for this visit. BP Readings from Last 3 Encounters:  06/30/23 (!) 150/106   04/25/23 (!) 159/98  11/14/22 (!) 151/104   Wt Readings from Last 3 Encounters:  04/25/23 172 lb (78 kg)  08/12/22 184 lb (83.5 kg)  05/09/21 184 lb 9.6 oz (83.7 kg)   There is no height or weight on file to calculate BMI.    Physical Exam     Lab Results  Component Value Date   WBC 6.5 08/12/2022   HGB 14.4 08/12/2022   HCT 42.6 08/12/2022   PLT 247 08/12/2022   GLUCOSE 96 08/12/2022   CHOL 226 (H) 08/12/2022   TRIG 112 08/12/2022   HDL 44 08/12/2022   LDLCALC 162 (H) 08/12/2022   ALT 9 08/12/2022   AST 13 08/12/2022   NA 142 08/12/2022   K 4.1 08/12/2022   CL 105 08/12/2022   CREATININE 1.20 (H) 08/12/2022   BUN 11 08/12/2022   CO2 23 08/12/2022   TSH 2.770 08/12/2022   INR 0.94 09/09/2016   HGBA1C 6.0 04/24/2021     Assessment & Plan:    See Problem List for Assessment and Plan of chronic medical problems.

## 2023-07-28 ENCOUNTER — Ambulatory Visit (INDEPENDENT_AMBULATORY_CARE_PROVIDER_SITE_OTHER): Payer: 59 | Admitting: Internal Medicine

## 2023-07-28 VITALS — BP 124/78 | HR 75 | Temp 98.7°F | Ht 69.0 in | Wt 178.0 lb

## 2023-07-28 DIAGNOSIS — M170 Bilateral primary osteoarthritis of knee: Secondary | ICD-10-CM

## 2023-07-28 DIAGNOSIS — I10 Essential (primary) hypertension: Secondary | ICD-10-CM | POA: Diagnosis not present

## 2023-07-28 NOTE — Assessment & Plan Note (Addendum)
Chronic BP well controlled Continue coreg 25 mg bid, spironolactone 50 mg daily

## 2023-07-28 NOTE — Assessment & Plan Note (Signed)
Acute on chronic Mild right knee OA, moderate left knee OA Recent flare - improved with oral steroids and injection in left knee Following with [facility] To start PT Still with some swelling and pain Continue tylenol, compression sleeves on knees Will do FMLA for short term only

## 2023-07-31 DIAGNOSIS — Z0279 Encounter for issue of other medical certificate: Secondary | ICD-10-CM

## 2023-08-05 ENCOUNTER — Telehealth: Payer: Self-pay

## 2023-08-05 NOTE — Telephone Encounter (Signed)
Called and left message for patient that FMLA paperwork complete and ready for pick up.  Form left upfront for pick up.

## 2023-08-06 NOTE — Telephone Encounter (Signed)
Patient has picked up paperwork.

## 2023-08-10 ENCOUNTER — Other Ambulatory Visit: Payer: Self-pay | Admitting: Physician Assistant

## 2023-08-17 ENCOUNTER — Other Ambulatory Visit: Payer: Self-pay | Admitting: *Deleted

## 2023-08-17 MED ORDER — CARVEDILOL 25 MG PO TABS: 25.0000 mg | ORAL_TABLET | Freq: Two times a day (BID) | ORAL | 0 refills | Status: DC

## 2023-09-08 ENCOUNTER — Other Ambulatory Visit: Payer: Self-pay | Admitting: Physician Assistant

## 2023-09-12 ENCOUNTER — Other Ambulatory Visit: Payer: Self-pay | Admitting: Cardiology

## 2023-09-13 ENCOUNTER — Other Ambulatory Visit: Payer: Self-pay | Admitting: Physician Assistant

## 2023-09-14 ENCOUNTER — Telehealth: Payer: Self-pay | Admitting: Cardiology

## 2023-09-14 MED ORDER — PANTOPRAZOLE SODIUM 40 MG PO TBEC: 40.0000 mg | DELAYED_RELEASE_TABLET | Freq: Every day | ORAL | 0 refills | Status: DC

## 2023-09-14 MED ORDER — ATORVASTATIN CALCIUM 40 MG PO TABS: 40.0000 mg | ORAL_TABLET | Freq: Every day | ORAL | 0 refills | Status: DC

## 2023-09-14 MED ORDER — SPIRONOLACTONE 100 MG PO TABS: 50.0000 mg | ORAL_TABLET | Freq: Every day | ORAL | 0 refills | Status: DC

## 2023-09-14 NOTE — Telephone Encounter (Signed)
*  STAT* If patient is at the pharmacy, call can be transferred to refill team.   1. Which medications need to be refilled? (please list name of each medication and dose if known)   spironolactone (ALDACTONE) 100 MG tablet    atorvastatin (LIPITOR) 40 MG tablet    pantoprazole (PROTONIX) 40 MG tablet    2. Which pharmacy/location (including street and city if local pharmacy) is medication to be sent to?[id number] - [place], [place] - [address]   3. Do they need a 30 day or 90 day supply? 30 day

## 2023-09-14 NOTE — Telephone Encounter (Signed)
T's medications were sent to pt's pharmacy as requested. Confirmation received.

## 2023-09-16 ENCOUNTER — Encounter: Payer: Self-pay | Admitting: Cardiology

## 2023-09-16 ENCOUNTER — Ambulatory Visit: Payer: 59 | Attending: Cardiology | Admitting: Cardiology

## 2023-09-16 VITALS — BP 142/98 | HR 82 | Ht 69.0 in | Wt 181.0 lb

## 2023-09-16 DIAGNOSIS — I951 Orthostatic hypotension: Secondary | ICD-10-CM

## 2023-09-16 DIAGNOSIS — I10 Essential (primary) hypertension: Secondary | ICD-10-CM

## 2023-09-16 DIAGNOSIS — R0602 Shortness of breath: Secondary | ICD-10-CM

## 2023-09-16 DIAGNOSIS — Z09 Encounter for follow-up examination after completed treatment for conditions other than malignant neoplasm: Secondary | ICD-10-CM | POA: Diagnosis not present

## 2023-09-16 DIAGNOSIS — Z79899 Other long term (current) drug therapy: Secondary | ICD-10-CM

## 2023-09-16 DIAGNOSIS — Z8249 Family history of ischemic heart disease and other diseases of the circulatory system: Secondary | ICD-10-CM

## 2023-09-16 NOTE — Progress Notes (Signed)
Date:  03/29/2020   ID:  CASHMERE FURBUSH, DOB [birth year], MRN [id number]   Date:  09/16/2023   ID:  Ozzie Hoyle, DOB [birth year], MRN [id number]  PCP:  Pincus Sanes, MD  Cardiologist:  Armanda Magic, MD    Referring MD: Pincus Sanes, MD   Chief Complaint  Patient presents with   Hypertension   Hyperlipidemia    History of Present Illness:    Debra Wade is a 52 y.o. female with a hx of HAs, HTN and CVA.  She has not tolerated amlodipine due to LE edema and had syncope and orthostatic hypotension after taking metoprolol and Losartan HCT.  She also has a history of preeclampsia. She was encouraged to use compression hose when standing for long periods of time at work. She was seen back several times in HTN clinic and Carvedilol titrated for BP goal < 130/90mmHg.  She was started on spironolactone 25mg  daily and subsequently increased to 50mg  daily.  Carvedilol eventually increased to 25mg  BID.    I saw her in Feb 2021 and she was complaining of SOB worse when laying down.   She was having to clear her throat a lot and was placed on Protonix for possible GERD.  2D echo was normal as well as labs including BNP.  Her sx resolved on PPI.  She is here today for followup and is doing well.  She denies any chest pain or pressure, SOB, DOE, PND, orthopnea, dizziness, palpitations or syncope. She has had some problems with LE edema after having to take a steroid taper earlier in the year and then ran out of her spiro a few days ago and her pharmacy would not fill until she saw me. She is compliant with her meds and is tolerating meds with no SE.    Past Medical History:  Diagnosis Date   Arthritis    Chicken pox    Frequent headaches    GERD (gastroesophageal reflux disease)    Hypertension    Stroke [facility])    Syncope     Past Surgical History:  Procedure Laterality Date   TUBAL LIGATION      Current Medications: Current Meds  Medication Sig   albuterol (VENTOLIN HFA) 108  (90 Base) MCG/ACT inhaler Inhale 2 puffs into the lungs every 4 (four) hours as needed for wheezing or shortness of breath.   atorvastatin (LIPITOR) 40 MG tablet Take 1 tablet (40 mg total) by mouth daily.   carvedilol (COREG) 25 MG tablet Take 1 tablet (25 mg total) by mouth 2 (two) times daily. 1st attempt. Pt needs yearly appt for any future refills. Please call office to schedule appt.   pantoprazole (PROTONIX) 40 MG tablet Take 1 tablet (40 mg total) by mouth daily.   spironolactone (ALDACTONE) 100 MG tablet Take 0.5 tablets (50 mg total) by mouth daily.     Allergies:   Other, Sulfa drugs cross reactors, and Prednisone   Social History   Socioeconomic History   Marital status: Single    Spouse name: Not on file   Number of children: 5   Years of education: 72   Highest education level: Not on file  Occupational History   Occupation: Med Tech  Tobacco Use   Smoking status: Every Day    Current packs/day: 0.30    Average packs/day: 0.3 packs/day for 24.0 years (7.2 ttl pk-yrs)    Types: Cigarettes   Smokeless tobacco: Never   Tobacco  comments:    about 8 cigarettes per day  Vaping Use   Vaping status: Never Used  Substance and Sexual Activity   Alcohol use: Yes    Comment: occ.    Drug use: No   Sexual activity: Not on file  Other Topics Concern   Not on file  Social History Narrative   Fun/Hobby: Franky Macho, anything relaxing   Denies abuse and feels safe at home.   Social Determinants of Health   Financial Resource Strain: Not on file  Food Insecurity: Not on file  Transportation Needs: Not on file  Physical Activity: Not on file  Stress: Not on file  Social Connections: Unknown (04/22/2022)   Received from [facility]   Social Network    Social Network: Not on file     Family History: The patient's family history includes Breast cancer in her maternal grandmother; Heart disease in her father; Hypertension in her father and mother; Prostate  cancer in her paternal grandfather.  ROS:   Please see the history of present illness.      All other systems reviewed and negative.   EKGs/Labs/Other Studies Reviewed:    The following studies were reviewed today: OV notes, 2D echo, labs  EKG Interpretation Date/Time:  Wednesday September 16 2023 15:52:12 EDT Ventricular Rate:  82 PR Interval:  154 QRS Duration:  74 QT Interval:  380 QTC Calculation: 443 R Axis:   65  Text Interpretation: Normal sinus rhythm Nonspecific T wave abnormality When compared with ECG of 09-Sep-2016 18:33, PREVIOUS ECG IS PRESENT Confirmed by Armanda Magic ([id number]) on 09/16/2023 3:56:47 PM    Recent Labs: No results found for requested labs within last 365 days.   Recent Lipid Panel    Component Value Date/Time   CHOL 226 (H) 08/12/2022 0854   TRIG 112 08/12/2022 0854   HDL 44 08/12/2022 0854   CHOLHDL 5.1 (H) 08/12/2022 0854   CHOLHDL 5 04/24/2021 1111   VLDL 23.4 04/24/2021 1111   LDLCALC 162 (H) 08/12/2022 0854    Physical Exam:    VS:  BP (!) 142/98   Pulse 82   Ht 5\' 9"  (1.753 m)   Wt 181 lb (82.1 kg)   LMP 08/12/2022   SpO2 99%   BMI 26.73 kg/m     Wt Readings from Last 3 Encounters:  09/16/23 181 lb (82.1 kg)  07/28/23 178 lb (80.7 kg)  04/25/23 172 lb (78 kg)     GEN: Well nourished, well developed in no acute distress HEENT: Normal NECK: No JVD; No carotid bruits LYMPHATICS: No lymphadenopathy CARDIAC:RRR, no murmurs, rubs, gallops RESPIRATORY:  Clear to auscultation without rales, wheezing or rhonchi  ABDOMEN: Soft, non-tender, non-distended MUSCULOSKELETAL:  No edema; No deformity  SKIN: Warm and dry NEUROLOGIC:  Alert and oriented x 3 PSYCHIATRIC:  Normal affect   EKG Interpretation Date/Time:  Wednesday September 16 2023 15:52:12 EDT Ventricular Rate:  82 PR Interval:  154 QRS Duration:  74 QT Interval:  380 QTC Calculation: 443 R Axis:   65  Text Interpretation: Normal sinus rhythm Nonspecific T wave  abnormality When compared with ECG of 09-Sep-2016 18:33, PREVIOUS ECG IS PRESENT Confirmed by Armanda Magic ([id number]) on 09/16/2023 3:56:47 PM   ASSESSMENT:    1. Follow-up exam   2. Essential hypertension   3. Orthostatic hypotension   4. SOB (shortness of breath)    PLAN:    In order of problems listed above:  1.  HTN -Bp poorly controlled today  but she had run out of her spiro and her pharmacy would not fill it without seeing me first -continue prescription drug management with Carvedilol 25mg  BID and restart spiro 50mg  daily with PRN refills -check BMET in 1 week -I have asked her to check her BP twice daily for a week and call with results   2.  Orthostatic Hypotension -no recent dizziness or syncope -continue to wear compression hose when standing for long periods of time   3.  SOB -was noted to have small bilateral pleural effusions on renal CT in Dec 2020  -Cxray 01/2020 was normal -breathing has improved on the allergy medicine she started to take when laying down at night -2D echo was normal and Suspected  GERD and started Protonix 40mg  daily  -symptoms have resolved on Protonix  4.  HLD -LDL was >150 and Herma Carson, PA ordered a Ca score at last OV but this never occurred -she is on atorvastatin 40mg  daily -Repeat FLP and ALT -she agrees to proceed with coronary Ca score to help assess future cardiac risk  Medication Adjustments/Labs and Tests Ordered: Current medicines are reviewed at length with the patient today.  Concerns regarding medicines are outlined above.  Orders Placed This Encounter  Procedures   EKG 12-Lead   No orders of the defined types were placed in this encounter.   Signed, Armanda Magic, MD  09/16/2023 4:01 PM    [facility]

## 2023-09-16 NOTE — Addendum Note (Signed)
Addended by: Luellen Pucker on: 09/16/2023 04:20 PM   Modules accepted: Orders

## 2023-09-16 NOTE — Patient Instructions (Signed)
Medication Instructions:  Your physician recommends that you continue on your current medications as directed. Please refer to the Current Medication list given to you today.  *If you need a refill on your cardiac medications before your next appointment, please call your pharmacy*   Lab Work: Please make an appointment to have a BMET drawn in our lab in one week.  If you have labs (blood work) drawn today and your tests are completely normal, you will receive your results only by: MyChart Message (if you have MyChart) OR A paper copy in the mail If you have any lab test that is abnormal or we need to change your treatment, we will call you to review the results.   Testing/Procedures: Your doctor has ordered a coronary calcium score CT. This is imaging that can help with early identification of coronary artery disease. Patient cost is between $94-99.   Follow-Up: At [facility], you and your health needs are our priority.  As part of our continuing mission to provide you with exceptional heart care, we have created designated Provider Care Teams.  These Care Teams include your primary Cardiologist (physician) and Advanced Practice Providers (APPs -  Physician Assistants and Nurse Practitioners) who all work together to provide you with the care you need, when you need it.  We recommend signing up for the patient portal called "MyChart".  Sign up information is provided on this After Visit Summary.  MyChart is used to connect with patients for Virtual Visits (Telemedicine).  Patients are able to view lab/test results, encounter notes, upcoming appointments, etc.  Non-urgent messages can be sent to your provider as well.   To learn more about what you can do with MyChart, go to [email or web address].    Your next appointment:   1 year(s)  Provider:   Armanda Magic, MD     Other Instructions Please check your blood pressure once a day at lunch or dinner and write down each  reading, the date and the time. Do this for one week and then call our office at [phone] to have our operators take down your readings and send them to Dr. Mayford Knife. You can also submit your readings over MyChart.

## 2023-09-23 ENCOUNTER — Ambulatory Visit: Payer: 59 | Attending: Cardiology

## 2023-09-23 ENCOUNTER — Telehealth: Payer: Self-pay | Admitting: Cardiology

## 2023-09-23 DIAGNOSIS — Z79899 Other long term (current) drug therapy: Secondary | ICD-10-CM

## 2023-09-23 LAB — BASIC METABOLIC PANEL
BUN/Creatinine Ratio: 13 (ref 9–23)
BUN: 14 mg/dL (ref 6–24)
CO2: 24 mmol/L (ref 20–29)
Calcium: 9.6 mg/dL (ref 8.7–10.2)
Chloride: 104 mmol/L (ref 96–106)
Creatinine, Ser: 1.09 mg/dL — ABNORMAL HIGH (ref 0.57–1.00)
Glucose: 88 mg/dL (ref 70–99)
Potassium: 4.2 mmol/L (ref 3.5–5.2)
Sodium: 142 mmol/L (ref 134–144)
eGFR: 62 mL/min/{1.73_m2} (ref >59)

## 2023-09-23 NOTE — Telephone Encounter (Signed)
Pt dropped off bp readings, will be in providers box by eod

## 2023-09-24 ENCOUNTER — Telehealth: Payer: Self-pay

## 2023-09-24 ENCOUNTER — Telehealth: Payer: Self-pay | Admitting: Cardiology

## 2023-09-24 NOTE — Telephone Encounter (Signed)
Pt returning nurses call regarding BP Log. Please advise

## 2023-09-24 NOTE — Telephone Encounter (Signed)
Call to patient to follow up on bp log, no answer, no # on DPR. Left message with no identifiers asking recipient to all our office number.

## 2023-09-24 NOTE — Telephone Encounter (Signed)
Called to advise patient that labs were normal.  Patient verbalizes understanding to continue current medical therapy.

## 2023-09-24 NOTE — Telephone Encounter (Signed)
-----   Message from Nurse Alcario Drought E sent at 09/16/2023  5:55 PM EDT ----- Regarding: BP log  ----- Message ----- From: Luellen Pucker, RN Sent: 09/16/2023   4:48 PM EDT To: Luellen Pucker, RN  BP check due in 1 week

## 2023-09-24 NOTE — Telephone Encounter (Signed)
-----   Message from Armanda Magic sent at 09/23/2023  8:10 PM EDT ----- Please let patient know that labs were normal.  Continue current medical therapy.

## 2023-09-25 ENCOUNTER — Ambulatory Visit (HOSPITAL_COMMUNITY)
Admission: RE | Admit: 2023-09-25 | Discharge: 2023-09-25 | Disposition: A | Discharge status: Home / Self Care | Payer: 59 | Source: Ambulatory Visit | Attending: Cardiology | Admitting: Cardiology

## 2023-09-25 DIAGNOSIS — Z8249 Family history of ischemic heart disease and other diseases of the circulatory system: Secondary | ICD-10-CM | POA: Insufficient documentation

## 2023-09-28 ENCOUNTER — Telehealth: Payer: Self-pay

## 2023-09-28 NOTE — Telephone Encounter (Signed)
-----   Message from Nurse Alcario Drought E sent at 09/16/2023  5:55 PM EDT ----- Regarding: BP log  ----- Message ----- From: Luellen Pucker, RN Sent: 09/16/2023   4:48 PM EDT To: Luellen Pucker, RN  BP check due in 1 week

## 2023-09-28 NOTE — Telephone Encounter (Signed)
Patient provided the following blood pressure log:   09/1023: 4:19 pm 135/102 HR 74  09/18/23: 1:19 pm 136/93 HR 76       5 pm 122/92  HR 74  09/19/23: 2:33 pm  137/93 HR 82       8:16 pm 148/98 HR 78  09/20/23: 4:45 pm 127/87 HR 80        09/21/23: 3:37 pm 133/95 HR 74  09/22/23: 3:30 pm 112/91 HR 76  09/23/23: 4:15 pm 141/92 HR 75

## 2023-09-29 NOTE — Telephone Encounter (Signed)
Call to patient to verify that she has not missed any doses of medications.   Patient explains that when she last saw Dr. Mayford Knife on 09/16/23, she had been out of her medications for a few days. Her refills were sent in that day and she restarted her medications on 09/17/23, the first day of her BP log. Patient responses forwarded to Dr. Mayford Knife.

## 2023-09-30 ENCOUNTER — Other Ambulatory Visit: Payer: Self-pay | Admitting: Orthopedic Surgery

## 2023-09-30 DIAGNOSIS — M5416 Radiculopathy, lumbar region: Secondary | ICD-10-CM

## 2023-10-01 ENCOUNTER — Telehealth: Payer: Self-pay

## 2023-10-01 NOTE — Telephone Encounter (Signed)
-----   Message from Armanda Magic sent at 09/30/2023 12:26 PM EDT ----- No coronary calcium

## 2023-10-01 NOTE — Telephone Encounter (Signed)
Call to patient to discuss coronary calcium CT score, patient verbalizes understanding of no coronary calcium.

## 2023-10-01 NOTE — Telephone Encounter (Signed)
Call to patient to request that she do one more week of BP log, patient agrees.   Also reviewed drug allergies. Patient states she only has allergies to sulfa antibiotics, she lists prednisone as an allergy due to risk of kidney injury as she has had slightly elevated creatinine chronically. Allergies updated.

## 2023-10-06 NOTE — Progress Notes (Deleted)
Subjective:    Patient ID: Debra Wade, female    DOB: [birth year], 52 y.o.   MRN: [id number]      HPI Debra Wade is here for No chief complaint on file.        Medications and allergies reviewed with patient and updated if appropriate.  Current Outpatient Medications on File Prior to Visit  Medication Sig Dispense Refill   albuterol (VENTOLIN HFA) 108 (90 Base) MCG/ACT inhaler Inhale 2 puffs into the lungs every 4 (four) hours as needed for wheezing or shortness of breath. 18 g 0   atorvastatin (LIPITOR) 40 MG tablet Take 1 tablet (40 mg total) by mouth daily. 30 tablet 0   carvedilol (COREG) 25 MG tablet Take 1 tablet (25 mg total) by mouth 2 (two) times daily. 180 tablet 3   Cyanocobalamin (VITAMIN B-12 PO) Take by mouth. (Patient not taking: Reported on 09/16/2023)     pantoprazole (PROTONIX) 40 MG tablet Take 1 tablet (40 mg total) by mouth daily. 30 tablet 0   spironolactone (ALDACTONE) 100 MG tablet Take 0.5 tablets (50 mg total) by mouth daily. 15 tablet 0   No current facility-administered medications on file prior to visit.    Review of Systems     Objective:  There were no vitals filed for this visit. BP Readings from Last 3 Encounters:  09/16/23 (!) 142/98  07/28/23 124/78  06/30/23 (!) 150/106   Wt Readings from Last 3 Encounters:  09/16/23 181 lb (82.1 kg)  07/28/23 178 lb (80.7 kg)  04/25/23 172 lb (78 kg)   There is no height or weight on file to calculate BMI.    Physical Exam         Assessment & Plan:    See Problem List for Assessment and Plan of chronic medical problems.

## 2023-10-07 ENCOUNTER — Ambulatory Visit: Payer: 59 | Admitting: Internal Medicine

## 2023-10-08 NOTE — Telephone Encounter (Signed)
Spoke to patient who states "my BP's are all over the place" and she is not sure if this has to do with her working night shift, if her cuff is not working or if she really does not have adequate and continuous control of her BP. She states she has an appt Monday with her PCP and is going to take her BP cuff and compare. She states she will call our office to discuss after that visit.

## 2023-10-11 ENCOUNTER — Other Ambulatory Visit: Payer: Self-pay | Admitting: Cardiology

## 2023-10-12 ENCOUNTER — Ambulatory Visit (INDEPENDENT_AMBULATORY_CARE_PROVIDER_SITE_OTHER): Payer: 59 | Admitting: Internal Medicine

## 2023-10-12 ENCOUNTER — Encounter: Payer: Self-pay | Admitting: Internal Medicine

## 2023-10-12 VITALS — BP 142/96 | HR 72 | Temp 98.5°F | Ht 69.0 in | Wt 178.0 lb

## 2023-10-12 DIAGNOSIS — E7800 Pure hypercholesterolemia, unspecified: Secondary | ICD-10-CM | POA: Diagnosis not present

## 2023-10-12 DIAGNOSIS — Z23 Encounter for immunization: Secondary | ICD-10-CM | POA: Diagnosis not present

## 2023-10-12 DIAGNOSIS — R5383 Other fatigue: Secondary | ICD-10-CM | POA: Diagnosis not present

## 2023-10-12 DIAGNOSIS — I10 Essential (primary) hypertension: Secondary | ICD-10-CM

## 2023-10-12 DIAGNOSIS — R7303 Prediabetes: Secondary | ICD-10-CM | POA: Diagnosis not present

## 2023-10-12 DIAGNOSIS — M170 Bilateral primary osteoarthritis of knee: Secondary | ICD-10-CM

## 2023-10-12 DIAGNOSIS — Z1211 Encounter for screening for malignant neoplasm of colon: Secondary | ICD-10-CM

## 2023-10-12 DIAGNOSIS — E785 Hyperlipidemia, unspecified: Secondary | ICD-10-CM | POA: Insufficient documentation

## 2023-10-12 DIAGNOSIS — Z124 Encounter for screening for malignant neoplasm of cervix: Secondary | ICD-10-CM

## 2023-10-12 DIAGNOSIS — M79605 Pain in left leg: Secondary | ICD-10-CM

## 2023-10-12 LAB — LIPID PANEL
Cholesterol: 128 mg/dL (ref 0–200)
HDL: 41.6 mg/dL (ref >39.00)
LDL Cholesterol: 69 mg/dL (ref 0–99)
NonHDL: 86.44
Total CHOL/HDL Ratio: 3
Triglycerides: 86 mg/dL (ref 0.0–149.0)
VLDL: 17.2 mg/dL (ref 0.0–40.0)

## 2023-10-12 LAB — CBC WITH DIFFERENTIAL/PLATELET
Basophils Absolute: 0 10*3/uL (ref 0.0–0.1)
Basophils Relative: 0.4 % (ref 0.0–3.0)
Eosinophils Absolute: 0.1 10*3/uL (ref 0.0–0.7)
Eosinophils Relative: 1.8 % (ref 0.0–5.0)
HCT: 42.7 % (ref 36.0–46.0)
Hemoglobin: 14.2 g/dL (ref 12.0–15.0)
Lymphocytes Relative: 51.1 % — ABNORMAL HIGH (ref 12.0–46.0)
Lymphs Abs: 3.4 10*3/uL (ref 0.7–4.0)
MCHC: 33.2 g/dL (ref 30.0–36.0)
MCV: 101 fL — ABNORMAL HIGH (ref 78.0–100.0)
Monocytes Absolute: 0.5 10*3/uL (ref 0.1–1.0)
Monocytes Relative: 6.8 % (ref 3.0–12.0)
Neutro Abs: 2.7 10*3/uL (ref 1.4–7.7)
Neutrophils Relative %: 39.9 % — ABNORMAL LOW (ref 43.0–77.0)
Platelets: 221 10*3/uL (ref 150.0–400.0)
RBC: 4.23 Mil/uL (ref 3.87–5.11)
RDW: 13.6 % (ref 11.5–15.5)
WBC: 6.7 10*3/uL (ref 4.0–10.5)

## 2023-10-12 LAB — HEMOGLOBIN A1C: Hgb A1c MFr Bld: 6 % (ref 4.6–6.5)

## 2023-10-12 LAB — TSH: TSH: 1 u[IU]/mL (ref 0.35–5.50)

## 2023-10-12 LAB — COMPREHENSIVE METABOLIC PANEL
ALT: 15 U/L (ref 0–35)
AST: 16 U/L (ref 0–37)
Albumin: 4.3 g/dL (ref 3.5–5.2)
Alkaline Phosphatase: 45 U/L (ref 39–117)
BUN: 12 mg/dL (ref 6–23)
CO2: 28 meq/L (ref 19–32)
Calcium: 9.5 mg/dL (ref 8.4–10.5)
Chloride: 107 meq/L (ref 96–112)
Creatinine, Ser: 1.04 mg/dL (ref 0.40–1.20)
GFR: 62.71 mL/min (ref >60.00)
Glucose, Bld: 95 mg/dL (ref 70–99)
Potassium: 4.5 meq/L (ref 3.5–5.1)
Sodium: 141 meq/L (ref 135–145)
Total Bilirubin: 0.9 mg/dL (ref 0.2–1.2)
Total Protein: 7.2 g/dL (ref 6.0–8.3)

## 2023-10-12 MED ORDER — VALSARTAN 40 MG PO TABS: 40.0000 mg | ORAL_TABLET | Freq: Every day | ORAL | 5 refills | Status: DC

## 2023-10-12 NOTE — Assessment & Plan Note (Signed)
Chronic Regular exercise and healthy diet encouraged Check lipid panel  Continue atorvastatin 40 mg daily Coronary artery calcium score 0

## 2023-10-12 NOTE — Assessment & Plan Note (Signed)
Chronic Check a1c Low sugar / carb diet Stressed regular exercise  

## 2023-10-12 NOTE — Progress Notes (Signed)
Subjective:    Patient ID: Debra Wade, female    DOB: 15-Feb-1971, 52 y.o.   MRN: [id number]      HPI Debra Wade is here for  Chief Complaint  Patient presents with   Medical Management of Chronic Issues    FMLA extension; Elevated BP     B/l knee OA - mild arthritis in right knee and moderate arthritis in left knee.  Seeing orthopedics - had an injection in her left knee.  To have an MRI of the lumbar spine - concerned the pain in the left leg is radiculopathy.     BP still elevated at home - 139/98, 154/100, 122/90's.  She is taking all of her medications as prescribed.  She is following with cardiology      Medications and allergies reviewed with patient and updated if appropriate.  Current Outpatient Medications on File Prior to Visit  Medication Sig Dispense Refill   albuterol (VENTOLIN HFA) 108 (90 Base) MCG/ACT inhaler Inhale 2 puffs into the lungs every 4 (four) hours as needed for wheezing or shortness of breath. 18 g 0   atorvastatin (LIPITOR) 40 MG tablet Take 1 tablet (40 mg total) by mouth daily. 30 tablet 0   carvedilol (COREG) 25 MG tablet Take 1 tablet (25 mg total) by mouth 2 (two) times daily. 180 tablet 3   pantoprazole (PROTONIX) 40 MG tablet Take 1 tablet (40 mg total) by mouth daily. 30 tablet 0   spironolactone (ALDACTONE) 100 MG tablet Take 0.5 tablets (50 mg total) by mouth daily. 15 tablet 0   Cyanocobalamin (VITAMIN B-12 PO) Take by mouth. (Patient not taking: Reported on 09/16/2023)     No current facility-administered medications on file prior to visit.    Review of Systems  HENT:  Positive for congestion.   Respiratory:  Negative for shortness of breath.   Cardiovascular:  Negative for chest pain, palpitations and leg swelling.  Musculoskeletal:  Positive for arthralgias and back pain (lower back).  Neurological:  Positive for headaches. Negative for dizziness and light-headedness.       Objective:   Vitals:   10/12/23 0957  BP: (!)  152/102  Pulse: 72  Temp: 98.5 F (36.9 C)  SpO2: 96%   BP Readings from Last 3 Encounters:  10/12/23 (!) 152/102  09/16/23 (!) 142/98  07/28/23 124/78   Wt Readings from Last 3 Encounters:  10/12/23 178 lb (80.7 kg)  09/16/23 181 lb (82.1 kg)  07/28/23 178 lb (80.7 kg)   Body mass index is 26.29 kg/m.    Physical Exam Constitutional:      General: She is not in acute distress.    Appearance: Normal appearance.  HENT:     Head: Normocephalic and atraumatic.  Eyes:     Conjunctiva/sclera: Conjunctivae normal.  Cardiovascular:     Rate and Rhythm: Normal rate and regular rhythm.     Heart sounds: Normal heart sounds.  Pulmonary:     Effort: Pulmonary effort is normal. No respiratory distress.     Breath sounds: Normal breath sounds. No wheezing.  Musculoskeletal:     Cervical back: Neck supple.     Right lower leg: No edema.     Left lower leg: No edema.  Lymphadenopathy:     Cervical: No cervical adenopathy.  Skin:    General: Skin is warm and dry.     Findings: No rash.  Neurological:     Mental Status: She is alert. Mental status is at  baseline.  Psychiatric:        Mood and Affect: Mood normal.        Behavior: Behavior normal.            Assessment & Plan:    See Problem List for Assessment and Plan of chronic medical problems.    Referral to GYN to establish-overdue for Pap, mammo  Referral to GI for colonoscopy-never had one

## 2023-10-12 NOTE — Assessment & Plan Note (Signed)
Chronic Mild right knee OA, moderate left knee OA Following with [facility] Had an injection in left knee ? Left leg pain from back - to have mri

## 2023-10-12 NOTE — Assessment & Plan Note (Signed)
Chronic BP not well controlled Continue coreg 25 mg bid, spironolactone 50 mg daily Start valsartan 40 mg daily Stressed the importance of getting her blood pressure well-controlled Monitor at home Will call cardiology to let them know what her blood pressure was here today Advise she needs to follow-up with them or me in 3-4 weeks

## 2023-10-12 NOTE — Assessment & Plan Note (Signed)
Chronic States fatigue Does not sleep well because she works overnight which may be contributing Check CBC, CMP, TSH

## 2023-10-12 NOTE — Assessment & Plan Note (Signed)
Subacute Initially thought to be related to osteoarthritis of the left knee which is moderate nature Following with orthopedics Pain possibly lumbar radiculopathy MRI scheduled for this month Will renew FMLA-will only renew for short-term and if she needs further extensions will need to be done through orthopedics since they are treating her for this.

## 2023-10-12 NOTE — Patient Instructions (Addendum)
    Flu immunization administered today.     Blood work was ordered.   The lab is on the first floor.    Medications changes include :   start valsartan 40 mg daily    A referral was ordered for GI for a colonoscopy and Gynecology to establish and someone will call you to schedule an appointment.     Return in about 6 months (around 04/10/2024) for Physical Exam.

## 2023-10-14 DIAGNOSIS — Z0279 Encounter for issue of other medical certificate: Secondary | ICD-10-CM

## 2023-10-14 NOTE — Addendum Note (Signed)
Addended by: Karma Ganja on: 10/14/2023 10:54 AM   Modules accepted: Orders

## 2023-10-15 NOTE — Telephone Encounter (Signed)
Call to patient to follow up on BP log. Patient states she saw her PCP on 10/12/23 and asked her PCP to help her verify whether her home bp cuff was accurate. Unfortunately, they both took values of around 142/96 as documented in chart. Dr. Lawerance Bach started patient on valsartan, which patient started taking yesterday. Patient states she will check her BP daily for a week and call us with results.

## 2023-10-15 NOTE — Telephone Encounter (Signed)
Call to patient to follow up on BP, no answer, left message with no identifiers asking recipient to call office #.

## 2023-10-16 ENCOUNTER — Telehealth: Payer: Self-pay

## 2023-10-16 NOTE — Telephone Encounter (Signed)
Paperwork completed and left upfront for pick up.

## 2023-10-23 NOTE — Telephone Encounter (Signed)
Call to patient to advise that her blood pressures are improved per Dr. Mayford Knife. Advised patient to continue current medications, patient verbalized understanding.

## 2023-10-23 NOTE — Telephone Encounter (Signed)
Call to patient who provided the following BP log.   10/22/23 124/80  10/21/23 118/82  10/20/23 137/82  She states she has been taking her BP several hours after her morning medications and has noticed a big improvement. Patient responses forwarded to Dr. Mayford Knife.

## 2023-10-26 ENCOUNTER — Telehealth: Payer: Self-pay

## 2023-10-26 ENCOUNTER — Ambulatory Visit
Admission: RE | Admit: 2023-10-26 | Discharge: 2023-10-26 | Disposition: A | Discharge status: Home / Self Care | Payer: 59 | Source: Ambulatory Visit | Attending: Orthopedic Surgery

## 2023-10-26 DIAGNOSIS — M5416 Radiculopathy, lumbar region: Secondary | ICD-10-CM

## 2023-10-26 NOTE — Telephone Encounter (Signed)
Called to discuss CT results, no answer. LVM asking recipient to call [place] at our office number.

## 2023-10-26 NOTE — Telephone Encounter (Signed)
-----   Message from Armanda Magic sent at 10/26/2023 10:34 AM EST ----- Noncon cardiac portion of coronary calcium score showed small pulmonary nodules measuring 4 mm.  Since she is a smoker please get her in with pulmonary with Dr. Tonia Brooms to follow-up

## 2023-10-27 ENCOUNTER — Other Ambulatory Visit: Payer: Self-pay | Admitting: Cardiology

## 2023-10-29 ENCOUNTER — Other Ambulatory Visit: Payer: Self-pay

## 2023-10-29 ENCOUNTER — Telehealth: Payer: Self-pay | Admitting: Cardiology

## 2023-10-29 MED ORDER — SPIRONOLACTONE 100 MG PO TABS: 50.0000 mg | ORAL_TABLET | Freq: Every day | ORAL | 3 refills | Status: DC

## 2023-10-29 NOTE — Telephone Encounter (Signed)
 Sent RX to requested Pharmacy

## 2023-10-29 NOTE — Telephone Encounter (Signed)
*  STAT* If patient is at the pharmacy, call can be transferred to refill team.   1. Which medications need to be refilled? (please list name of each medication and dose if known)  spironolactone (ALDACTONE) 100 MG tablet  2. Which pharmacy/location (including street and city if local pharmacy) is medication to be sent to?* [id number] - [place], [place] - [address]  3. Do they need a 30 day or 90 day supply?   90 day supply

## 2023-11-06 ENCOUNTER — Ambulatory Visit (HOSPITAL_COMMUNITY)
Admission: EM | Admit: 2023-11-06 | Discharge: 2023-11-06 | Disposition: A | Discharge status: Home / Self Care | Payer: 59 | Attending: Emergency Medicine | Admitting: Emergency Medicine

## 2023-11-06 ENCOUNTER — Encounter (HOSPITAL_COMMUNITY): Payer: Self-pay | Admitting: Emergency Medicine

## 2023-11-06 DIAGNOSIS — U071 COVID-19: Secondary | ICD-10-CM

## 2023-11-06 LAB — POC COVID19/FLU A&B COMBO
Covid Antigen, POC: POSITIVE — AB
Influenza A Antigen, POC: NEGATIVE
Influenza B Antigen, POC: NEGATIVE

## 2023-11-06 MED ORDER — BENZONATATE 200 MG PO CAPS: 200.0000 mg | ORAL_CAPSULE | Freq: Three times a day (TID) | ORAL | 0 refills | Status: DC

## 2023-11-06 NOTE — Discharge Instructions (Addendum)
You tested positive for COVID-19 today in clinic.  For congestion you can take 1200 mg of Mucinex daily.  For body aches and fever you can alternate between 800 mg of ibuprofen and 500 mg of Tylenol every 4-6 hours.  For cough you can take [employer] as needed.  Ensure you are getting plenty of rest and drinking lots of water.  You can also sleep with a humidifier to help moisten the air when you sleep.    Symptoms should improve over the next 7 days or so, if no improvement or any changes you can return to clinic or follow-up with your primary care provider.

## 2023-11-06 NOTE — ED Provider Notes (Signed)
[facility]    CSN: [id number] Arrival date & time: 11/06/23  1136      History   Chief Complaint Chief Complaint  Patient presents with   Cough   Generalized Body Aches    HPI Debra Wade is a 52 y.o. female.   Patient presents to clinic with a visitor.  Reports cough, congestion, rhinorrhea, body aches headache and chills that started yesterday morning.  She took Tylenol and Coricidin this morning around 8 AM.  Feels like she is wheezing a little when she is coughing. No shortness of breath.   Denies any recent sick contacts.  Denies any abdominal pain, nausea, vomiting or diarrhea.  No sore throat.   The history is provided by the patient and medical records.  Cough   Past Medical History:  Diagnosis Date   Arthritis    Chicken pox    Frequent headaches    GERD (gastroesophageal reflux disease)    Hypertension    Stroke [facility])    Syncope     Patient Active Problem List   Diagnosis Date Noted   Hyperlipidemia 10/12/2023   Prediabetes 10/12/2023   Left leg pain 10/12/2023   Bilateral primary osteoarthritis of knee 07/15/2023   Hyperglycemia 04/24/2021   GERD (gastroesophageal reflux disease) 04/23/2021   Tobacco abuse 07/06/2017   Essential hypertension 06/09/2017   Fatigue 06/09/2017   Snoring 06/09/2017    Past Surgical History:  Procedure Laterality Date   TUBAL LIGATION      OB History   No obstetric history on file.      Home Medications    Prior to Admission medications   Medication Sig Start Date End Date Taking? Authorizing Provider  benzonatate (TESSALON) 200 MG capsule Take 1 capsule (200 mg total) by mouth every 8 (eight) hours. 11/06/23  Yes Rinaldo Ratel, [place] N, FNP  albuterol (VENTOLIN HFA) 108 (90 Base) MCG/ACT inhaler Inhale 2 puffs into the lungs every 4 (four) hours as needed for wheezing or shortness of breath. Patient not taking: Reported on 11/06/2023 12/08/20   Rodriguez-Southworth, Nettie Elm, PA-C  atorvastatin  (LIPITOR) 40 MG tablet TAKE 1 TABLET BY MOUTH EVERY DAY 10/12/23   Quintella Reichert, MD  carvedilol (COREG) 25 MG tablet Take 1 tablet (25 mg total) by mouth 2 (two) times daily. 09/16/23   Quintella Reichert, MD  Cyanocobalamin (VITAMIN B-12 PO) Take by mouth. Patient not taking: Reported on 09/16/2023    [provider]  pantoprazole (PROTONIX) 40 MG tablet Take 1 tablet (40 mg total) by mouth daily. 09/14/23   Quintella Reichert, MD  spironolactone (ALDACTONE) 100 MG tablet Take 0.5 tablets (50 mg total) by mouth daily. 10/29/23   Quintella Reichert, MD  valsartan (DIOVAN) 40 MG tablet Take 1 tablet (40 mg total) by mouth daily. 10/12/23   Pincus Sanes, MD    Family History Family History  Problem Relation Age of Onset   Hypertension Mother    Hypertension Father    Heart disease Father    Breast cancer Maternal Grandmother    Prostate cancer Paternal Grandfather     Social History Social History   Tobacco Use   Smoking status: Every Day    Current packs/day: 0.30    Average packs/day: 0.3 packs/day for 24.0 years (7.2 ttl pk-yrs)    Types: Cigarettes   Smokeless tobacco: Never   Tobacco comments:    about 8 cigarettes per day  Vaping Use   Vaping status: Never Used  Substance Use Topics   Alcohol use: Yes    Comment: occ.    Drug use: No     Allergies   Sulfa drugs cross reactors and Prednisone   Review of Systems Review of Systems  Per HPI   Physical Exam Triage Vital Signs ED Triage Vitals  Encounter Vitals Group     BP 11/06/23 1320 (!) 129/91     Systolic BP Percentile --      Diastolic BP Percentile --      Pulse Rate 11/06/23 1320 74     Resp 11/06/23 1320 18     Temp 11/06/23 1320 98.1 F (36.7 C)     Temp Source 11/06/23 1320 Oral     SpO2 11/06/23 1320 94 %     Weight --      Height --      Head Circumference --      Peak Flow --      Pain Score 11/06/23 1318 7     Pain Loc --      Pain Education --      Exclude from Growth Chart --     No data found.  Updated Vital Signs BP (!) 129/91 (BP Location: Right Arm)   Pulse 74   Temp 98.1 F (36.7 C) (Oral)   Resp 18   LMP 08/12/2022   SpO2 94%   Visual Acuity Right Eye Distance:   Left Eye Distance:   Bilateral Distance:    Right Eye Near:   Left Eye Near:    Bilateral Near:     Physical Exam Vitals and nursing note reviewed.  Constitutional:      Appearance: Normal appearance.  HENT:     Head: Normocephalic and atraumatic.     Right Ear: External ear normal.     Left Ear: External ear normal.     Nose: Congestion and rhinorrhea present.     Mouth/Throat:     Mouth: Mucous membranes are moist.     Pharynx: Posterior oropharyngeal erythema present.  Eyes:     Conjunctiva/sclera: Conjunctivae normal.  Cardiovascular:     Rate and Rhythm: Normal rate and regular rhythm.     Heart sounds: Normal heart sounds. No murmur heard. Pulmonary:     Effort: Pulmonary effort is normal. No respiratory distress.     Breath sounds: Normal breath sounds.  Musculoskeletal:        General: Normal range of motion.  Skin:    General: Skin is warm and dry.  Neurological:     General: No focal deficit present.     Mental Status: She is alert.  Psychiatric:        Mood and Affect: Mood normal.      UC Treatments / Results  Labs (all labs ordered are listed, but only abnormal results are displayed) Labs Reviewed  POC COVID19/FLU A&B COMBO - Abnormal; Notable for the following components:      Result Value   Covid Antigen, POC Positive (*)    All other components within normal limits    EKG   Radiology No results found.  Procedures Procedures (including critical care time)  Medications Ordered in UC Medications - No data to display  Initial Impression / Assessment and Plan / UC Course  I have reviewed the triage vital signs and the nursing notes.  Pertinent labs & imaging results that were available during my care of the patient were reviewed by me  and considered in my medical decision  making (see chart for details).  Vitals and triage reviewed, patient is hemodynamically stable.  Congestion, rhinorrhea and postnasal drip present on physical exam.  Lungs are vesicular, heart with regular rate and rhythm.  Rapid COVID positive, flu testing negative.  Symptomatic management for viral URI discussed.  Work note provided.  Plan of care, follow-up care return precautions given, no questions at this time.     Final Clinical Impressions(s) / UC Diagnoses   Final diagnoses:  COVID-19 virus infection     Discharge Instructions      You tested positive for COVID-19 today in clinic.  For congestion you can take 1200 mg of Mucinex daily.  For body aches and fever you can alternate between 800 mg of ibuprofen and 500 mg of Tylenol every 4-6 hours.  For cough you can take [employer] as needed.  Ensure you are getting plenty of rest and drinking lots of water.  You can also sleep with a humidifier to help moisten the air when you sleep.    Symptoms should improve over the next 7 days or so, if no improvement or any changes you can return to clinic or follow-up with your primary care provider.      ED Prescriptions     Medication Sig Dispense Auth. Provider   benzonatate (TESSALON) 200 MG capsule Take 1 capsule (200 mg total) by mouth every 8 (eight) hours. 30 capsule Zaire Vanbus[place], [place], [place]      PDMP not reviewed this encounter.   [facility], [place], [place] 11/06/23 [phone]

## 2023-11-06 NOTE — ED Triage Notes (Signed)
Pt c/o cough, runny nose, body aches that started yesterday morning.   She took tylenol this morning around 8am

## 2023-11-09 ENCOUNTER — Telehealth: Payer: Self-pay | Admitting: Internal Medicine

## 2023-11-09 NOTE — Telephone Encounter (Signed)
Pt went to a urgent care Friday due to COVID symptoms and she got tested the same day and was positive. Pt was wondering could she get some type of prescription until she comes in to see you on 12.5.24.Marland Kitchen  PLEASE ADVISE, THANKS

## 2023-11-10 NOTE — Telephone Encounter (Signed)
Spoke with patient today and she is feeling better. Body aches have subsided and she hasn't had to take Tylenol today.

## 2023-11-11 NOTE — Progress Notes (Unsigned)
    Subjective:    Patient ID: Debra Wade, female    DOB: [birth year], 52 y.o.   MRN: [id number]      HPI Debra Wade is here for No chief complaint on file.        Medications and allergies reviewed with patient and updated if appropriate.  Current Outpatient Medications on File Prior to Visit  Medication Sig Dispense Refill   albuterol (VENTOLIN HFA) 108 (90 Base) MCG/ACT inhaler Inhale 2 puffs into the lungs every 4 (four) hours as needed for wheezing or shortness of breath. (Patient not taking: Reported on 11/06/2023) 18 g 0   atorvastatin (LIPITOR) 40 MG tablet TAKE 1 TABLET BY MOUTH EVERY DAY 30 tablet 0   benzonatate (TESSALON) 200 MG capsule Take 1 capsule (200 mg total) by mouth every 8 (eight) hours. 30 capsule 0   carvedilol (COREG) 25 MG tablet Take 1 tablet (25 mg total) by mouth 2 (two) times daily. 180 tablet 3   Cyanocobalamin (VITAMIN B-12 PO) Take by mouth. (Patient not taking: Reported on 09/16/2023)     pantoprazole (PROTONIX) 40 MG tablet Take 1 tablet (40 mg total) by mouth daily. 30 tablet 0   spironolactone (ALDACTONE) 100 MG tablet Take 0.5 tablets (50 mg total) by mouth daily. 45 tablet 3   valsartan (DIOVAN) 40 MG tablet Take 1 tablet (40 mg total) by mouth daily. 30 tablet 5   No current facility-administered medications on file prior to visit.    Review of Systems     Objective:  There were no vitals filed for this visit. BP Readings from Last 3 Encounters:  11/06/23 (!) 129/91  10/12/23 (!) 142/96  09/16/23 (!) 142/98   Wt Readings from Last 3 Encounters:  10/12/23 178 lb (80.7 kg)  09/16/23 181 lb (82.1 kg)  07/28/23 178 lb (80.7 kg)   There is no height or weight on file to calculate BMI.    Physical Exam         Assessment & Plan:    See Problem List for Assessment and Plan of chronic medical problems.

## 2023-11-12 ENCOUNTER — Encounter: Payer: Self-pay | Admitting: Internal Medicine

## 2023-11-12 ENCOUNTER — Ambulatory Visit: Payer: 59 | Admitting: Internal Medicine

## 2023-11-12 ENCOUNTER — Other Ambulatory Visit: Payer: Self-pay | Admitting: Cardiology

## 2023-11-12 VITALS — BP 126/76 | HR 79 | Temp 98.6°F | Ht 69.0 in | Wt 177.0 lb

## 2023-11-12 DIAGNOSIS — U071 COVID-19: Secondary | ICD-10-CM | POA: Diagnosis not present

## 2023-11-12 DIAGNOSIS — I10 Essential (primary) hypertension: Secondary | ICD-10-CM | POA: Diagnosis not present

## 2023-11-12 MED ORDER — HYDROCODONE BIT-HOMATROP MBR 5-1.5 MG/5ML PO SOLN: 5.0000 mL | Freq: Three times a day (TID) | ORAL | 0 refills | Status: DC | PRN

## 2023-11-12 MED ORDER — ALBUTEROL SULFATE HFA 108 (90 BASE) MCG/ACT IN AERS: 2.0000 | INHALATION_SPRAY | RESPIRATORY_TRACT | 0 refills | Status: DC | PRN

## 2023-11-12 NOTE — Patient Instructions (Addendum)
      Medications changes include :   cough syrup for nighttime and albuterol inhaler      Return if symptoms worsen or fail to improve.

## 2023-11-12 NOTE — Assessment & Plan Note (Signed)
Acute Symptoms a little better but still symptomatic as expected Lung clear on exam Discussed symptomatic treatment Continue albuterol inhaler  - can use every 6 hr prn - new rx sent to pharmacy Hycodan cough syrup Continue tylenol

## 2023-11-12 NOTE — Assessment & Plan Note (Signed)
Chronic BP well controlled Continue coreg 25 mg bid, spironolactone 50 mg daily, valsartan 40 mg daily

## 2023-11-13 ENCOUNTER — Other Ambulatory Visit: Payer: Self-pay | Admitting: Cardiology

## 2023-11-13 ENCOUNTER — Telehealth: Payer: Self-pay | Admitting: Cardiology

## 2023-11-13 MED ORDER — ATORVASTATIN CALCIUM 40 MG PO TABS: 40.0000 mg | ORAL_TABLET | Freq: Every day | ORAL | 3 refills | Status: DC

## 2023-11-13 NOTE — Telephone Encounter (Signed)
*  STAT* If patient is at the pharmacy, call can be transferred to refill team.   1. Which medications need to be refilled? (please list name of each medication and dose if known) atorvastatin (LIPITOR) 40 MG tablet   2. Which pharmacy/location (including street and city if local pharmacy) is medication to be sent to? [id number] [place], [place] - [address]   3. Do they need a 30 day or 90 day supply? 90

## 2023-11-13 NOTE — Telephone Encounter (Signed)
This has already been refilled

## 2023-12-03 ENCOUNTER — Telehealth: Payer: Self-pay

## 2023-12-03 DIAGNOSIS — R911 Solitary pulmonary nodule: Secondary | ICD-10-CM

## 2023-12-03 NOTE — Telephone Encounter (Signed)
-----   Message from Armanda Magic sent at 10/26/2023 10:34 AM EST ----- Noncon cardiac portion of coronary calcium score showed small pulmonary nodules measuring 4 mm.  Since she is a smoker please get her in with pulmonary with Dr. Tonia Brooms to follow-up

## 2023-12-03 NOTE — Telephone Encounter (Signed)
Call to patient to advise that non-cardiac portion of ct scan showed pulmonary nodules and Dr. Mayford Knife recommends f/u with Dr. Tonia Brooms at [facility]. Patient verbalizes understanding and agrees to plan.

## 2023-12-05 ENCOUNTER — Other Ambulatory Visit: Payer: Self-pay | Admitting: Internal Medicine

## 2023-12-14 ENCOUNTER — Encounter: Payer: 59 | Admitting: Radiology

## 2023-12-24 ENCOUNTER — Encounter: Payer: 59 | Admitting: Radiology

## 2023-12-31 ENCOUNTER — Encounter: Payer: Self-pay | Admitting: Emergency Medicine

## 2023-12-31 ENCOUNTER — Ambulatory Visit: Payer: Commercial Managed Care - PPO | Admitting: Emergency Medicine

## 2023-12-31 VITALS — BP 141/92 | HR 78 | Ht 69.0 in | Wt 184.0 lb

## 2023-12-31 DIAGNOSIS — Z720 Tobacco use: Secondary | ICD-10-CM

## 2023-12-31 DIAGNOSIS — R918 Other nonspecific abnormal finding of lung field: Secondary | ICD-10-CM

## 2023-12-31 DIAGNOSIS — U071 COVID-19: Secondary | ICD-10-CM

## 2023-12-31 NOTE — Patient Instructions (Signed)
VISIT SUMMARY:  Today, we reviewed your recent health concerns, including the small pulmonary nodules found on your CT scan, your recovery from COVID-19, and your history of asthma and tobacco use. We discussed the next steps and recommendations for each of these issues.  YOUR PLAN:  -PULMONARY NODULES: Pulmonary nodules are small growths in the lungs. Your CT scan showed two 4mm nodules, one in the right middle lobe and one in the left upper lobe, as well as a calcified granuloma in the left lower lobe. Given your smoking history, you are at low to moderate risk for lung cancer. We will schedule a follow-up CT scan in October 2025 to monitor any changes in these nodules.  -COVID-19: You recently recovered from COVID-19 and used an albuterol inhaler during your illness. You no longer have respiratory symptoms or need for the inhaler. No further action is required at this time.  -ASTHMA: Asthma is a condition where your airways narrow and swell, making it difficult to breathe. You have a history of childhood asthma but no current symptoms. No further action is required unless future symptoms suggest an asthma pattern, in which case we may consider pulmonary function tests.  -TOBACCO USE: You have a 20-year history of smoking half a pack per day. Smoking can lead to various health issues, including lung disease and cancer. We strongly encourage you to quit smoking to improve your overall health.  INSTRUCTIONS:  Please schedule a follow-up CT chest scan in October 2025 to monitor the pulmonary nodules. Continue to monitor your health and report any new symptoms. Consider smoking cessation programs to help quit smoking.

## 2023-12-31 NOTE — Assessment & Plan Note (Signed)
Tobacco Use 20-year history of smoking half a pack per day. -Encourage smoking cessation for overall health improvement.

## 2023-12-31 NOTE — Assessment & Plan Note (Signed)
Pulmonary Nodules Small pulmonary nodules (4mm in right middle lobe and left upper lobe) identified on cardiac calcium CT chest from 09/25/2023. No significant risk factors for malignancy identified. Patient is a low to moderate risk for lung cancer due to smoking history. -Schedule follow-up CT chest in October 2025 to assess for any changes in nodules.

## 2023-12-31 NOTE — Assessment & Plan Note (Addendum)
COVID-19 Recent recovery from COVID-19 with use of albuterol inhaler during illness. No ongoing respiratory symptoms or need for inhaler use. -No further action required at this time. -She has a history of childhood asthma, had an asthma-like syndrome.  COVID-19.  Because symptoms have all but she does not have symptoms in absence of viral process I think we can hold off on PFT for now.  We might consider doing so in the future depending on trend.

## 2023-12-31 NOTE — Progress Notes (Signed)
Subjective:    Patient ID: Debra Wade, female    DOB: 1971-01-11, 53 y.o.   MRN: [id number]  HPI 53 year old woman with history of tobacco use (10 pack years), GERD, hypertension, headaches, CVA.  She had COVID-19 in late [facility] December 2024.  She was treated with an albuterol inhaler during that time. She is here today to discuss small pulmonary nodules noted on a CT scan of the chest 09/2023.   During her COVID-19 illness, she experienced a full range of symptoms, including loss of taste and smell, and was treated with an albuterol inhaler, a treatment she had not used since childhood when she had asthma. The patient reports that she has since stopped using the inhaler and has returned to work, walking 10,000 steps a day without any breathing difficulties or coughing. Her sense of taste and smell returned after a week.  The patient's primary concern is small pulmonary nodules identified on a CT scan of the chest conducted on 10/24. The scan revealed a 4mm right middle lobe nodule, a 4mm left upper lobe nodule, and a calcified granuloma in the left lower lobe. The patient is a current smoker, with a 20-year history of smoking half a pack a day. She has no personal history of cancer, no family history of lung cancer, and no history of inflammatory diseases. 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 She has no known exposure to inhaled toxins or mold.   Cardiac calcium CT chest 09/25/2023 reviewed by me showed no mediastinal adenopathy.  There was an angular 4 mm right middle lobe nodule, 4 mm left upper lobe nodule, benign calcified granuloma in the left lower lobe.  No further workup was recommended for low risk patients.   Review of Systems As per HPI  Past Medical History:  Diagnosis Date   Arthritis    Chicken pox    Frequent headaches    GERD (gastroesophageal reflux disease)    Hypertension    Stroke (HCC)    Syncope       Family History  Problem Relation Age of Onset   Hypertension Mother    Hypertension Father    Heart disease Father    Breast cancer Maternal Grandmother    Prostate cancer Paternal Grandfather     - No family history of lung cancer  Social History   Socioeconomic History   Marital status: Single    Spouse name: Not on file   Number of children: 5   Years of education: 11   Highest education level: Not on file  Occupational History   Occupation: Med Tech  Tobacco Use   Smoking status: Every Day    Current packs/day: 0.30    Average packs/day: 0.3 packs/day for 24.0 years (7.2 ttl pk-yrs)    Types: Cigarettes   Smokeless tobacco: Never   Tobacco comments:    about 8 cigarettes per day  Vaping Use   Vaping status: Never Used  Substance and Sexual Activity   Alcohol use: Yes    Comment: occ.    Drug use: No   Sexual activity: Not on file  Other Topics Concern   Not on file  Social History Narrative   Fun/Hobby: Franky Macho, anything relaxing   Denies abuse and feels safe at home.   Social Drivers of Corporate investment banker Strain: Not on file  Food Insecurity: Not on file  Transportation Needs: Not on file  Physical Activity:  Not on file  Stress: Not on file  Social Connections: Unknown (04/22/2022)   Received from [facility]   Social Network    Social Network: Not on file  Intimate Partner Violence: Unknown (03/14/2022)   Received from [facility], [facility]   HITS    Physically Hurt: Not on file    Insult or Talk Down To: Not on file    Threaten Physical Harm: Not on file    Scream or Curse: Not on file    She is a nurse, from  She has worked as a Engineer, 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 She has no known exposure to inhaled toxins or mold. - Patient has been smoking for 20 years, half a pack a day  Allergies  Allergen Reactions   Sulfa Drugs Cross Reactors Anaphylaxis, Swelling and Rash   Prednisone  Swelling    Patient can and has taken prednisone before but states they need to be used with caution because of risk of kidney injury.      Outpatient Medications Prior to Visit  Medication Sig Dispense Refill   atorvastatin (LIPITOR) 40 MG tablet Take 1 tablet (40 mg total) by mouth daily. 90 tablet 3   carvedilol (COREG) 25 MG tablet Take 1 tablet (25 mg total) by mouth 2 (two) times daily. 180 tablet 3   pantoprazole (PROTONIX) 40 MG tablet Take 1 tablet (40 mg total) by mouth daily. 30 tablet 0   spironolactone (ALDACTONE) 100 MG tablet Take 0.5 tablets (50 mg total) by mouth daily. 45 tablet 3   valsartan (DIOVAN) 40 MG tablet Take 1 tablet (40 mg total) by mouth daily. 30 tablet 5   albuterol (VENTOLIN HFA) 108 (90 Base) MCG/ACT inhaler INHALE 2 PUFFS INTO THE LUNGS EVERY 4 HOURS AS NEEDED FOR WHEEZING OR SHORTNESS OF BREATH. (Patient not taking: Reported on 12/31/2023) 8.5 each 5   benzonatate (TESSALON) 200 MG capsule Take 1 capsule (200 mg total) by mouth every 8 (eight) hours. (Patient not taking: Reported on 12/31/2023) 30 capsule 0   Cyanocobalamin (VITAMIN B-12 PO) Take by mouth. (Patient not taking: Reported on 12/31/2023)     HYDROcodone bit-homatropine (HYCODAN) 5-1.5 MG/5ML syrup Take 5 mLs by mouth every 8 (eight) hours as needed for cough. (Patient not taking: Reported on 12/31/2023) 120 mL 0   No facility-administered medications prior to visit.        Objective:   Physical Exam Vitals:   12/31/23 1258  BP: (!) 141/92  Pulse: 78  SpO2: 98%  Weight: 184 lb (83.5 kg)  Height: 5\' 9"  (1.753 m)    Gen: Pleasant, well-nourished, in no distress,  normal affect  ENT: No lesions,  mouth clear,  oropharynx clear, no postnasal drip  Neck: No JVD, no stridor  Lungs: No use of accessory muscles, no crackles or wheezing on normal respiration, no wheeze on forced expiration  Cardiovascular: RRR, heart sounds normal, no murmur or gallops, no peripheral  edema  Musculoskeletal: No deformities, no cyanosis or clubbing  Neuro: alert, awake, non focal  Skin: Warm, no lesions or rash      Assessment & Plan:   Pulmonary nodules Pulmonary Nodules Small pulmonary nodules (4mm in right middle lobe and left upper lobe) identified on cardiac calcium CT chest from 09/25/2023. No significant risk factors for malignancy identified. Patient is a low to moderate risk for lung cancer due to smoking history. -Schedule follow-up CT chest in October 2025 to assess  for any changes in nodules.  COVID COVID-19 Recent recovery from COVID-19 with use of albuterol inhaler during illness. No ongoing respiratory symptoms or need for inhaler use. -No further action required at this time. -She has a history of childhood asthma, had an asthma-like syndrome.  COVID-19.  Because symptoms have all but she does not have symptoms in absence of viral process I think we can hold off on PFT for now.  We might consider doing so in the future depending on trend.  Tobacco abuse Tobacco Use 20-year history of smoking half a pack per day. -Encourage smoking cessation for overall health improvement.   Levy Pupa, MD, PhD 12/31/2023, 1:30 PM [facility] [phone] or if no answer before 7:00PM call [phone] For any issues after 7:00PM please call eLink [phone]

## 2024-01-11 ENCOUNTER — Encounter (HOSPITAL_COMMUNITY): Payer: Self-pay

## 2024-01-11 ENCOUNTER — Ambulatory Visit (HOSPITAL_COMMUNITY)
Admission: EM | Admit: 2024-01-11 | Discharge: 2024-01-11 | Disposition: A | Discharge status: Home / Self Care | Payer: Commercial Managed Care - PPO | Attending: Family Medicine | Admitting: Family Medicine

## 2024-01-11 DIAGNOSIS — J111 Influenza due to unidentified influenza virus with other respiratory manifestations: Secondary | ICD-10-CM

## 2024-01-11 DIAGNOSIS — J4521 Mild intermittent asthma with (acute) exacerbation: Secondary | ICD-10-CM

## 2024-01-11 MED ORDER — OSELTAMIVIR PHOSPHATE 75 MG PO CAPS: 75.0000 mg | ORAL_CAPSULE | Freq: Two times a day (BID) | ORAL | 0 refills | Status: DC

## 2024-01-11 MED ORDER — PREDNISONE 20 MG PO TABS: 40.0000 mg | ORAL_TABLET | Freq: Every day | ORAL | 0 refills | Status: AC

## 2024-01-11 NOTE — ED Provider Notes (Signed)
[facility]    CSN: [id number] Arrival date & time: 01/11/24  1817      History   Chief Complaint Chief Complaint  Patient presents with   Cough    HPI Debra MCCOMBS is a 53 y.o. female.    Cough Here for cough and nasal congestion and chills.  She is also had myalgia and malaise.  She has a history of asthma and that has been worse today and she is wheezing some.  She does feel like she has chest congestion. Symptoms began this morning.  She was exposed to her daughter who had the flu and tested positive about 4 or 5 days ago.  No nausea vomiting or diarrhea. Her chart lists that she cannot take prednisone.  She states that that was because of poor kidney function at 1 point but that has improved and she does take prednisone occasionally for her arthritis when it flares.  Past Medical History:  Diagnosis Date   Arthritis    Chicken pox    Frequent headaches    GERD (gastroesophageal reflux disease)    Hypertension    Stroke [facility])    Syncope     Patient Active Problem List   Diagnosis Date Noted   Pulmonary nodules 12/31/2023   COVID 11/12/2023   Hyperlipidemia 10/12/2023   Prediabetes 10/12/2023   Left leg pain 10/12/2023   Bilateral primary osteoarthritis of knee 07/15/2023   Hyperglycemia 04/24/2021   GERD (gastroesophageal reflux disease) 04/23/2021   Tobacco abuse 07/06/2017   Essential hypertension 06/09/2017   Fatigue 06/09/2017   Snoring 06/09/2017    Past Surgical History:  Procedure Laterality Date   TUBAL LIGATION      OB History   No obstetric history on file.      Home Medications    Prior to Admission medications   Medication Sig Start Date End Date Taking? Authorizing Provider  oseltamivir (TAMIFLU) 75 MG capsule Take 1 capsule (75 mg total) by mouth every 12 (twelve) hours. 01/11/24  Yes Zenia Resides, MD  predniSONE (DELTASONE) 20 MG tablet Take 2 tablets (40 mg total) by mouth daily with breakfast for 3 days.  01/11/24 01/14/24 Yes Zenia Resides, MD  albuterol (VENTOLIN HFA) 108 (90 Base) MCG/ACT inhaler INHALE 2 PUFFS INTO THE LUNGS EVERY 4 HOURS AS NEEDED FOR WHEEZING OR SHORTNESS OF BREATH. Patient not taking: Reported on 12/31/2023 12/07/23   Pincus Sanes, MD  atorvastatin (LIPITOR) 40 MG tablet Take 1 tablet (40 mg total) by mouth daily. 11/13/23   Quintella Reichert, MD  carvedilol (COREG) 25 MG tablet Take 1 tablet (25 mg total) by mouth 2 (two) times daily. 09/16/23   Quintella Reichert, MD  Cyanocobalamin (VITAMIN B-12 PO) Take by mouth. Patient not taking: Reported on 12/31/2023    [provider]  HYDROcodone bit-homatropine (HYCODAN) 5-1.5 MG/5ML syrup Take 5 mLs by mouth every 8 (eight) hours as needed for cough. Patient not taking: Reported on 12/31/2023 11/12/23   Pincus Sanes, MD  pantoprazole (PROTONIX) 40 MG tablet Take 1 tablet (40 mg total) by mouth daily. 09/14/23   Quintella Reichert, MD  spironolactone (ALDACTONE) 100 MG tablet Take 0.5 tablets (50 mg total) by mouth daily. 10/29/23   Quintella Reichert, MD  valsartan (DIOVAN) 40 MG tablet Take 1 tablet (40 mg total) by mouth daily. 10/12/23   Pincus Sanes, MD    Family History Family History  Problem Relation Age of Onset  Hypertension Mother    Hypertension Father    Heart disease Father    Breast cancer Maternal Grandmother    Prostate cancer Paternal Grandfather     Social History Social History   Tobacco Use   Smoking status: Every Day    Current packs/day: 0.30    Average packs/day: 0.3 packs/day for 24.0 years (7.2 ttl pk-yrs)    Types: Cigarettes   Smokeless tobacco: Never   Tobacco comments:    about 8 cigarettes per day  Vaping Use   Vaping status: Never Used  Substance Use Topics   Alcohol use: Yes    Comment: occ.    Drug use: No     Allergies   Sulfa drugs cross reactors   Review of Systems Review of Systems  Respiratory:  Positive for cough.      Physical Exam Triage Vital Signs ED  Triage Vitals  Encounter Vitals Group     BP 01/11/24 1944 (!) 135/94     Systolic BP Percentile --      Diastolic BP Percentile --      Pulse Rate 01/11/24 1944 65     Resp 01/11/24 1944 18     Temp 01/11/24 1944 97.8 F (36.6 C)     Temp Source 01/11/24 1944 Oral     SpO2 01/11/24 1944 97 %     Weight --      Height --      Head Circumference --      Peak Flow --      Pain Score 01/11/24 1943 2     Pain Loc --      Pain Education --      Exclude from Growth Chart --    No data found.  Updated Vital Signs BP (!) 135/94 (BP Location: Right Arm)   Pulse 65   Temp 97.8 F (36.6 C) (Oral)   Resp 18   LMP 08/12/2022   SpO2 97%   Visual Acuity Right Eye Distance:   Left Eye Distance:   Bilateral Distance:    Right Eye Near:   Left Eye Near:    Bilateral Near:     Physical Exam   UC Treatments / Results  Labs (all labs ordered are listed, but only abnormal results are displayed) Labs Reviewed - No data to display  EKG   Radiology No results found.  Procedures Procedures (including critical care time)  Medications Ordered in UC Medications - No data to display  Initial Impression / Assessment and Plan / UC Course  I have reviewed the triage vital signs and the nursing notes.  Pertinent labs & imaging results that were available during my care of the patient were reviewed by me and considered in my medical decision making (see chart for details).     We have no test kits for flu in the clinic.  She is treated empirically with Tamiflu for influenza-like illness.  3 days of prednisone is sent in for the asthma exacerbation.  She has an adequate supply of her rescue inhaler. Final Clinical Impressions(s) / UC Diagnoses   Final diagnoses:  Influenza-like illness  Mild intermittent asthma with exacerbation     Discharge Instructions      Take oseltamivir 75 mg--1 capsule 2 times daily for 5 days  Take prednisone 20 mg--2 daily for 3  days      ED Prescriptions     Medication Sig Dispense Auth. Provider   oseltamivir (TAMIFLU) 75 MG capsule Take 1  capsule (75 mg total) by mouth every 12 (twelve) hours. 10 capsule Zenia Resides, MD   predniSONE (DELTASONE) 20 MG tablet Take 2 tablets (40 mg total) by mouth daily with breakfast for 3 days. 6 tablet Marlinda Mike Janace Aris, MD      PDMP not reviewed this encounter.   Zenia Resides, MD 01/11/24 2033

## 2024-01-11 NOTE — Discharge Instructions (Signed)
Take oseltamivir 75 mg--1 capsule 2 times daily for 5 days  Take prednisone 20 mg--2 daily for 3 days

## 2024-01-11 NOTE — ED Triage Notes (Signed)
Pt c/o fatigue, sinus pressure, congestion, productive cough, sore throat and bilat earache (L greater than R) x1 day. Daughter has flu. Has taken OTC cold meds, Tylenol, and Claritin.

## 2024-01-15 ENCOUNTER — Encounter: Payer: Self-pay | Admitting: Internal Medicine

## 2024-01-15 ENCOUNTER — Ambulatory Visit (INDEPENDENT_AMBULATORY_CARE_PROVIDER_SITE_OTHER): Payer: Commercial Managed Care - PPO | Admitting: Internal Medicine

## 2024-01-15 VITALS — BP 128/82 | HR 72 | Temp 98.4°F | Ht 69.0 in | Wt 186.0 lb

## 2024-01-15 DIAGNOSIS — J111 Influenza due to unidentified influenza virus with other respiratory manifestations: Secondary | ICD-10-CM | POA: Insufficient documentation

## 2024-01-15 DIAGNOSIS — I10 Essential (primary) hypertension: Secondary | ICD-10-CM

## 2024-01-15 NOTE — Assessment & Plan Note (Signed)
 Acute Presumed influenza given exposure, but was never tested I do think she has a viral illness-if not influenza then one of the other viruses that is going around She will complete Tamiflu  Continue regular use of her inhalers, Claritin, Coricidin cold products Rest, fluids Discussed this is viral and is gena take time to go away-typically 7-10 days but no other treatment warranted Note given for work for yesterday and today She will call with any concerns or questions

## 2024-01-15 NOTE — Progress Notes (Signed)
 Subjective:    Patient ID: Debra Wade, female    DOB: September 15, 1971, 53 y.o.   MRN: [id number]      HPI Debra Wade is here for  Chief Complaint  Patient presents with   Nasal Congestion    Nasal congestion; headaches, chills still; fatigue     Symptoms started 2/3, 4 days ago.  Her daughter had the flu.  She did go to urgent care that day.  They did not have a flu test available, but still she likely had flu given the exposure.  They started her on Tamiflu  and prednisone  for 3 days because of wheezing.  She is taking Tamiflu .  She is taking Claritin and using her inhalers regularly.  Some of her symptoms have improved.  She still has a little bit of shortness of breath and wheezing, but they are much better.  She still has a cough that is occasionally productive.  She states significant fatigue, fever, chills, nasal congestion, sinus pain, sore throat, nausea.  Diarrhea has resolved.  Lightheadedness has resolved.  She is having headaches.  She was given a note for work, but only for 3 days and she was not able to go back yesterday.     Medications and allergies reviewed with patient and updated if appropriate.  Current Outpatient Medications on File Prior to Visit  Medication Sig Dispense Refill   albuterol  (VENTOLIN  HFA) 108 (90 Base) MCG/ACT inhaler INHALE 2 PUFFS INTO THE LUNGS EVERY 4 HOURS AS NEEDED FOR WHEEZING OR SHORTNESS OF BREATH. 8.5 each 5   atorvastatin  (LIPITOR) 40 MG tablet Take 1 tablet (40 mg total) by mouth daily. 90 tablet 3   carvedilol  (COREG ) 25 MG tablet Take 1 tablet (25 mg total) by mouth 2 (two) times daily. 180 tablet 3   Cyanocobalamin (VITAMIN B-12 PO) Take by mouth.     oseltamivir  (TAMIFLU ) 75 MG capsule Take 1 capsule (75 mg total) by mouth every 12 (twelve) hours. 10 capsule 0   pantoprazole  (PROTONIX ) 40 MG tablet Take 1 tablet (40 mg total) by mouth daily. 30 tablet 0   spironolactone  (ALDACTONE ) 100 MG tablet Take 0.5 tablets (50 mg total) by  mouth daily. 45 tablet 3   valsartan  (DIOVAN ) 40 MG tablet Take 1 tablet (40 mg total) by mouth daily. 30 tablet 5   HYDROcodone  bit-homatropine (HYCODAN) 5-1.5 MG/5ML syrup Take 5 mLs by mouth every 8 (eight) hours as needed for cough. (Patient not taking: Reported on 01/15/2024) 120 mL 0   No current facility-administered medications on file prior to visit.    Review of Systems  Constitutional:  Positive for chills, fatigue and fever. Negative for appetite change.  HENT:  Positive for congestion, sinus pain and sore throat (resolved). Negative for ear pain.   Respiratory:  Positive for cough (occ productive), shortness of breath (on monday - resolved) and wheezing (when it started - better).   Gastrointestinal:  Positive for diarrhea (resolved) and nausea. Negative for vomiting.  Musculoskeletal:  Negative for myalgias.  Neurological:  Positive for headaches. Negative for dizziness (resolved) and light-headedness (initially but resolved).       Objective:   Vitals:   01/15/24 1052  BP: (!) 130/92  Pulse: 63  Temp: 98.4 F (36.9 C)  SpO2: 90%   BP Readings from Last 3 Encounters:  01/15/24 (!) 130/92  01/11/24 (!) 135/94  12/31/23 (!) 141/92   Wt Readings from Last 3 Encounters:  01/15/24 186 lb (84.4 kg)  12/31/23 184 lb (  83.5 kg)  11/12/23 177 lb (80.3 kg)   Body mass index is 27.47 kg/m.    Physical Exam Constitutional:      General: She is not in acute distress.    Appearance: Normal appearance. She is not ill-appearing.  HENT:     Head: Normocephalic and atraumatic.     Right Ear: Tympanic membrane, ear canal and external ear normal.     Left Ear: Tympanic membrane, ear canal and external ear normal.     Mouth/Throat:     Mouth: Mucous membranes are moist.     Pharynx: No oropharyngeal exudate or posterior oropharyngeal erythema.  Eyes:     Conjunctiva/sclera: Conjunctivae normal.  Cardiovascular:     Rate and Rhythm: Normal rate and regular rhythm.   Pulmonary:     Effort: Pulmonary effort is normal. No respiratory distress.     Breath sounds: Normal breath sounds. No wheezing or rales.  Musculoskeletal:     Cervical back: Neck supple. No tenderness.  Lymphadenopathy:     Cervical: No cervical adenopathy.  Skin:    General: Skin is warm and dry.  Neurological:     Mental Status: She is alert.            Assessment & Plan:    See Problem List for Assessment and Plan of chronic medical problems.

## 2024-01-15 NOTE — Patient Instructions (Addendum)
       Medications changes include :   None        Return if symptoms worsen or fail to improve.

## 2024-01-15 NOTE — Assessment & Plan Note (Signed)
 Chronic BP initially elevated, but improved on repeat Continue current medications-carvedilol  25 mg twice daily spironolactone  50 mg daily, valsartan  40 mg daily

## 2024-03-23 ENCOUNTER — Telehealth: Admitting: Internal Medicine

## 2024-03-23 ENCOUNTER — Ambulatory Visit: Payer: Self-pay

## 2024-03-23 DIAGNOSIS — J019 Acute sinusitis, unspecified: Secondary | ICD-10-CM | POA: Diagnosis not present

## 2024-03-23 DIAGNOSIS — R7303 Prediabetes: Secondary | ICD-10-CM | POA: Diagnosis not present

## 2024-03-23 DIAGNOSIS — J309 Allergic rhinitis, unspecified: Secondary | ICD-10-CM

## 2024-03-23 MED ORDER — TRIAMCINOLONE ACETONIDE 55 MCG/ACT NA AERO: 2.0000 | INHALATION_SPRAY | Freq: Every day | NASAL | 12 refills | Status: DC

## 2024-03-23 MED ORDER — CETIRIZINE HCL 10 MG PO TABS: 10.0000 mg | ORAL_TABLET | Freq: Every day | ORAL | 11 refills | Status: AC

## 2024-03-23 MED ORDER — PREDNISONE 10 MG PO TABS: ORAL_TABLET | ORAL | 0 refills | Status: DC

## 2024-03-23 MED ORDER — AZITHROMYCIN 250 MG PO TABS: ORAL_TABLET | ORAL | 1 refills | Status: AC

## 2024-03-23 NOTE — Assessment & Plan Note (Signed)
Mild to mod, for antibx course zpack, ,  to f/u any worsening symptoms or concerns

## 2024-03-23 NOTE — Patient Instructions (Signed)
 Please take all new medication as prescribed

## 2024-03-23 NOTE — Telephone Encounter (Signed)
  Chief Complaint: sinus congestion and pressure Symptoms: pressure, congestion,  Frequency: yesterday Pertinent Negatives: Patient denies fever,  Disposition: [] ED /[] Urgent Care (no appt availability in office) / [x] Appointment(In office/virtual)/ []  [facility]/ [] Home Care/ [] Refused Recommended Disposition /[] [facility]/ []  Follow-up with PCP Additional Notes: Pt states that she has a hx of sinus infections and this feels the same. Pt states clear drainage. Denies worsening SOB. Denies fever. Sched VV today. Also asking what OTC meds she can take with her HTN hx.  Copied from CRM [id number]. Topic: Clinical - Medical Advice >> Mar 23, 2024  9:06 AM Debra Wade wrote: Reason for CRM: Patient has congestion and stuffy nose, has an appointment tomorrow with Dr.Burns, but wanted to know If there was anything in the meantime that she can take for the congestion would like a callback regarding this Reason for Disposition  [1] Sinus pain (not just congestion) AND [2] fever  Answer Assessment - Initial Assessment Questions 1. LOCATION: "Where does it hurt?"      Pressure between eyes and nose 2. ONSET: "When did the sinus pain start?"  (e.g., hours, days)      yesterday 3. SEVERITY: "How bad is the pain?"   (Scale 1-10; mild, moderate or severe)   - MILD (1-3): doesn't interfere with normal activities    - MODERATE (4-7): interferes with normal activities (e.g., work or school) or awakens from sleep   - SEVERE (8-10): excruciating pain and patient unable to do any normal activities        8 4. RECURRENT SYMPTOM: "Have you ever had sinus problems before?" If Yes, ask: "When was the last time?" and "What happened that time?"      Feels like sinus infection, hx of them 5. NASAL CONGESTION: "Is the nose blocked?" If Yes, ask: "Can you open it or must you breathe through your mouth?"     Nose is stuffy and draning 6. NASAL DISCHARGE: "Do you have discharge from your nose?" If so  ask, "What color?"     clear 7. FEVER: "Do you have a fever?" If Yes, ask: "What is it, how was it measured, and when did it start?"      denies 8. OTHER SYMPTOMS: "Do you have any other symptoms?" (e.g., sore throat, cough, earache, difficulty breathing)     Ear pressure, scratchy throat,  9. PREGNANCY: "Is there any chance you are pregnant?" "When was your last menstrual period?"     denies  Protocols used: Sinus Pain or Congestion-A-AH

## 2024-03-23 NOTE — Assessment & Plan Note (Signed)
 Mild to mod, for prednisone taper, zyrtec 10 every day prn, and nasacort asd,,  to f/u any worsening symptoms or concerns

## 2024-03-23 NOTE — Assessment & Plan Note (Signed)
 Lab Results  Component Value Date   HGBA1C 6.0 10/12/2023   Stable, pt to continue current medical treatment  - diet, wt control

## 2024-03-23 NOTE — Progress Notes (Signed)
 Patient ID: Debra Wade, female   DOB: 1971-06-04, 53 y.o.   MRN: [id number]  Virtual Visit via Video Note  I connected with Debra Wade on 03/23/24 at  3:40 PM EDT by a video enabled telemedicine application and verified that I am speaking with the correct person using two identifiers.  Location of all participants today Patient: at home Provider: at office   I discussed the limitations of evaluation and management by telemedicine and the availability of in person appointments. The patient expressed understanding and agreed to proceed.  History of Present Illness:  Here with 2-3 days acute onset fever, facial pain, pressure, headache, general weakness and malaise, and greenish d/c, with mild ST and cough, but pt denies chest pain, wheezing, increased sob or doe, orthopnea, PND, increased LE swelling, palpitations, dizziness or syncope.  Does have several wks ongoing nasal allergy symptoms with clearish congestion, itch and sneezing, without fever, pain, ST, cough, swelling or wheezing.   Past Medical History:  Diagnosis Date   Arthritis    Chicken pox    Frequent headaches    GERD (gastroesophageal reflux disease)    Hypertension    Stroke [facility])    Syncope    Past Surgical History:  Procedure Laterality Date   TUBAL LIGATION      reports that she has been smoking cigarettes. She has a 7.2 pack-year smoking history. She has never used smokeless tobacco. She reports current alcohol use. She reports that she does not use drugs. family history includes Breast cancer in her maternal grandmother; Heart disease in her father; Hypertension in her father and mother; Prostate cancer in her paternal grandfather. Allergies  Allergen Reactions   Sulfa Drugs Cross Reactors Anaphylaxis, Swelling and Rash   Current Outpatient Medications on File Prior to Visit  Medication Sig Dispense Refill   albuterol (VENTOLIN HFA) 108 (90 Base) MCG/ACT inhaler INHALE 2 PUFFS INTO THE LUNGS EVERY 4 HOURS  AS NEEDED FOR WHEEZING OR SHORTNESS OF BREATH. 8.5 each 5   atorvastatin (LIPITOR) 40 MG tablet Take 1 tablet (40 mg total) by mouth daily. 90 tablet 3   carvedilol (COREG) 25 MG tablet Take 1 tablet (25 mg total) by mouth 2 (two) times daily. 180 tablet 3   Cyanocobalamin (VITAMIN B-12 PO) Take by mouth.     oseltamivir (TAMIFLU) 75 MG capsule Take 1 capsule (75 mg total) by mouth every 12 (twelve) hours. 10 capsule 0   pantoprazole (PROTONIX) 40 MG tablet Take 1 tablet (40 mg total) by mouth daily. 30 tablet 0   spironolactone (ALDACTONE) 100 MG tablet Take 0.5 tablets (50 mg total) by mouth daily. 45 tablet 3   valsartan (DIOVAN) 40 MG tablet Take 1 tablet (40 mg total) by mouth daily. 30 tablet 5   No current facility-administered medications on file prior to visit.    Observations/Objective: Alert, mild ill appropriate mood and affect, resps normal, cn 2-12 intact, moves all 4s, no visible rash or swelling Lab Results  Component Value Date   WBC 6.7 10/12/2023   HGB 14.2 10/12/2023   HCT 42.7 10/12/2023   PLT 221.0 10/12/2023   GLUCOSE 95 10/12/2023   CHOL 128 10/12/2023   TRIG 86.0 10/12/2023   HDL 41.60 10/12/2023   LDLCALC 69 10/12/2023   ALT 15 10/12/2023   AST 16 10/12/2023   NA 141 10/12/2023   K 4.5 10/12/2023   CL 107 10/12/2023   CREATININE 1.04 10/12/2023   BUN 12 10/12/2023   CO2 28  10/12/2023   TSH 1.00 10/12/2023   INR 0.94 09/09/2016   HGBA1C 6.0 10/12/2023   Assessment and Plan: See notes  Follow Up Instructions: See notes   I discussed the assessment and treatment plan with the patient. The patient was provided an opportunity to ask questions and all were answered. The patient agreed with the plan and demonstrated an understanding of the instructions.   The patient was advised to call back or seek an in-person evaluation if the symptoms worsen or if the condition fails to improve as anticipated.  Rosalia Colonel, MD

## 2024-03-24 ENCOUNTER — Encounter: Payer: Self-pay | Admitting: Internal Medicine

## 2024-03-24 ENCOUNTER — Telehealth: Admitting: Internal Medicine

## 2024-03-24 ENCOUNTER — Telehealth: Payer: Self-pay | Admitting: Internal Medicine

## 2024-03-24 NOTE — Telephone Encounter (Signed)
 Ok letter is sent to pt by [employer]

## 2024-03-24 NOTE — Telephone Encounter (Signed)
 Copied from CRM [id number]. Topic: General - Other >> Mar 24, 2024  8:54 AM Debra Wade wrote: Reason for CRM: Patient is requesting a doctors note from her telemedicine visit with Dr. Autry Legions yesterday. Patient requesting a status when ready so she can come and pick it up.

## 2024-04-05 ENCOUNTER — Other Ambulatory Visit: Payer: Self-pay | Admitting: Internal Medicine

## 2024-04-09 ENCOUNTER — Other Ambulatory Visit: Payer: Self-pay | Admitting: Cardiology

## 2024-04-10 ENCOUNTER — Encounter: Payer: Self-pay | Admitting: Internal Medicine

## 2024-04-10 NOTE — Patient Instructions (Addendum)
      Blood work was ordered.   Have this done in about two weeks.    Medications changes include :   increase valsartan  to 80 mg daily,  chantix starter pak      Return in about 6 months (around 10/12/2024) for Physical Exam.

## 2024-04-10 NOTE — Progress Notes (Unsigned)
 Subjective:    Patient ID: Debra Wade, female    DOB: 25-Dec-1970, 53 y.o.   MRN: [id number]     HPI Maragret is here for follow up of her chronic medical problems.   Does not check BP at home - if it is high - casues anxiety  Feeling better  from cold - still some congestion and cough.    Doing over 10,000 steps per day at [facility] 7:30 PM-4:30 AM  Medications and allergies reviewed with patient and updated if appropriate.  Current Outpatient Medications on File Prior to Visit  Medication Sig Dispense Refill   albuterol  (VENTOLIN  HFA) 108 (90 Base) MCG/ACT inhaler INHALE 2 PUFFS INTO THE LUNGS EVERY 4 HOURS AS NEEDED FOR WHEEZING OR SHORTNESS OF BREATH. 8.5 each 5   atorvastatin  (LIPITOR) 40 MG tablet Take 1 tablet (40 mg total) by mouth daily. 90 tablet 3   carvedilol  (COREG ) 25 MG tablet Take 1 tablet (25 mg total) by mouth 2 (two) times daily. 180 tablet 3   cetirizine  (ZYRTEC ) 10 MG tablet Take 1 tablet (10 mg total) by mouth daily. 30 tablet 11   spironolactone  (ALDACTONE ) 100 MG tablet Take 0.5 tablets (50 mg total) by mouth daily. 45 tablet 3   triamcinolone  (NASACORT ) 55 MCG/ACT AERO nasal inhaler Place 2 sprays into the nose daily. 1 each 12   No current facility-administered medications on file prior to visit.     Review of Systems  Constitutional:  Negative for fever.  Respiratory:  Positive for cough (residual from recent URI) and wheezing (uses inhaler). Negative for shortness of breath.   Cardiovascular:  Negative for chest pain, palpitations and leg swelling.  Musculoskeletal:  Positive for arthralgias.  Neurological:  Negative for light-headedness and headaches.       Objective:   Vitals:   04/11/24 1025 04/11/24 1113  BP: (!) 140/92 136/72  Pulse: 79   Temp: 98 F (36.7 C)   SpO2: 95%    BP Readings from Last 3 Encounters:  04/11/24 136/72  01/15/24 128/82  01/11/24 (!) 135/94   Wt Readings from Last 3 Encounters:  04/11/24 189 lb  (85.7 kg)  01/15/24 186 lb (84.4 kg)  12/31/23 184 lb (83.5 kg)   Body mass index is 27.91 kg/m.    Physical Exam Constitutional:      General: She is not in acute distress.    Appearance: Normal appearance.  HENT:     Head: Normocephalic and atraumatic.  Eyes:     Conjunctiva/sclera: Conjunctivae normal.  Cardiovascular:     Rate and Rhythm: Normal rate and regular rhythm.     Heart sounds: Normal heart sounds.  Pulmonary:     Effort: Pulmonary effort is normal. No respiratory distress.     Breath sounds: Normal breath sounds. No wheezing.  Musculoskeletal:     Cervical back: Neck supple.     Right lower leg: No edema.     Left lower leg: No edema.  Lymphadenopathy:     Cervical: No cervical adenopathy.  Skin:    General: Skin is warm and dry.     Findings: No rash.  Neurological:     Mental Status: She is alert. Mental status is at baseline.  Psychiatric:        Mood and Affect: Mood normal.        Behavior: Behavior normal.        Lab Results  Component Value Date   WBC 6.7 10/12/2023  HGB 14.2 10/12/2023   HCT 42.7 10/12/2023   PLT 221.0 10/12/2023   GLUCOSE 95 10/12/2023   CHOL 128 10/12/2023   TRIG 86.0 10/12/2023   HDL 41.60 10/12/2023   LDLCALC 69 10/12/2023   ALT 15 10/12/2023   AST 16 10/12/2023   NA 141 10/12/2023   K 4.5 10/12/2023   CL 107 10/12/2023   CREATININE 1.04 10/12/2023   BUN 12 10/12/2023   CO2 28 10/12/2023   TSH 1.00 10/12/2023   INR 0.94 09/09/2016   HGBA1C 6.0 10/12/2023     Assessment & Plan:    See Problem List for Assessment and Plan of chronic medical problems.

## 2024-04-11 ENCOUNTER — Ambulatory Visit: Payer: 59 | Admitting: Internal Medicine

## 2024-04-11 VITALS — BP 136/72 | HR 79 | Temp 98.0°F | Ht 69.0 in | Wt 189.0 lb

## 2024-04-11 DIAGNOSIS — K219 Gastro-esophageal reflux disease without esophagitis: Secondary | ICD-10-CM

## 2024-04-11 DIAGNOSIS — E7800 Pure hypercholesterolemia, unspecified: Secondary | ICD-10-CM

## 2024-04-11 DIAGNOSIS — F17210 Nicotine dependence, cigarettes, uncomplicated: Secondary | ICD-10-CM

## 2024-04-11 DIAGNOSIS — M1612 Unilateral primary osteoarthritis, left hip: Secondary | ICD-10-CM

## 2024-04-11 DIAGNOSIS — Z720 Tobacco use: Secondary | ICD-10-CM

## 2024-04-11 DIAGNOSIS — M169 Osteoarthritis of hip, unspecified: Secondary | ICD-10-CM | POA: Insufficient documentation

## 2024-04-11 DIAGNOSIS — I10 Essential (primary) hypertension: Secondary | ICD-10-CM | POA: Diagnosis not present

## 2024-04-11 DIAGNOSIS — R7303 Prediabetes: Secondary | ICD-10-CM

## 2024-04-11 MED ORDER — PANTOPRAZOLE SODIUM 40 MG PO TBEC: 40.0000 mg | DELAYED_RELEASE_TABLET | Freq: Every day | ORAL | 1 refills | Status: DC

## 2024-04-11 MED ORDER — VARENICLINE TARTRATE (STARTER) 0.5 MG X 11 & 1 MG X 42 PO TBPK: ORAL_TABLET | ORAL | 0 refills | Status: DC

## 2024-04-11 MED ORDER — VALSARTAN 80 MG PO TABS: 80.0000 mg | ORAL_TABLET | Freq: Every day | ORAL | 3 refills | Status: AC

## 2024-04-11 NOTE — Assessment & Plan Note (Signed)
 Chronic Left hip OA Getting injections by ortho which is helping

## 2024-04-11 NOTE — Assessment & Plan Note (Signed)
 Chronic Regular exercise and healthy diet encouraged Check lipid panel  Continue atorvastatin 40 mg daily Coronary artery calcium score 0

## 2024-04-11 NOTE — Assessment & Plan Note (Addendum)
 Chronic Lab Results  Component Value Date   HGBA1C 6.0 10/12/2023   Check a1c Low sugar / carb diet-not always compliant with a low sugar diet-she states she often uses that as a pick me up to get through her work hours Stressed regular exercise

## 2024-04-11 NOTE — Assessment & Plan Note (Signed)
 Chronic GERD fairly controlled Continue pantoprazole  40 mg daily

## 2024-04-11 NOTE — Assessment & Plan Note (Addendum)
 Chronic BP elevated initially it did improve, but still higher than ideal Continue current medications-carvedilol  25 mg twice daily spironolactone  50 mg daily Increase valsartan  to 80 mg daily CMP in 2 weeks

## 2024-04-11 NOTE — Assessment & Plan Note (Addendum)
 Chronic Smoking 1.5 ppd in 2 days She has decreased the amount that she smokes, but has not been able to quit on her own. Tried nicotine  patches/gum - caused elevated BP Discussed smoking cessation for 4 minutes She understands the dangers of smoking and she does want to quit she is just not been able to quit Discussed chantix  and reviewed side effects including nausea, headaches, vivid dreams or nightmares or worsening of depression She understands the side effects and is interested in trying this because she does want to quit Start Chantix starter pack Advised that if she does well with this we can do the continuation pack for 2 more months or longer if needed

## 2024-04-13 ENCOUNTER — Telehealth: Payer: Self-pay | Admitting: Internal Medicine

## 2024-04-13 MED ORDER — AZITHROMYCIN 250 MG PO TABS: ORAL_TABLET | ORAL | 0 refills | Status: DC

## 2024-04-13 NOTE — Telephone Encounter (Signed)
 Likely she is up-to-date with her Tdap which hopefully will help, but I did send a Z-Pak to her pharmacy for her to take which should help prevent whooping cough.

## 2024-04-13 NOTE — Telephone Encounter (Unsigned)
 Copied from CRM [id number]. Topic: Clinical - Medical Advice >> Apr 13, 2024  9:23 AM Albertha Alosa wrote: Reason for CRM: Patient called in stating her son was just recently diagnosed with whooping cough , sons provider recommend for her to contact her Dr.Burns to see If she needs to take something or be prescribed something   Would like a callback

## 2024-04-14 NOTE — Telephone Encounter (Signed)
 Spoke with patient today.

## 2024-06-20 ENCOUNTER — Encounter: Payer: Self-pay | Admitting: Family

## 2024-06-20 ENCOUNTER — Ambulatory Visit: Payer: Self-pay

## 2024-06-20 ENCOUNTER — Ambulatory Visit (INDEPENDENT_AMBULATORY_CARE_PROVIDER_SITE_OTHER): Admitting: Family

## 2024-06-20 VITALS — BP 146/92 | HR 74 | Temp 98.4°F | Ht 69.0 in | Wt 195.0 lb

## 2024-06-20 DIAGNOSIS — J019 Acute sinusitis, unspecified: Secondary | ICD-10-CM

## 2024-06-20 DIAGNOSIS — B9689 Other specified bacterial agents as the cause of diseases classified elsewhere: Secondary | ICD-10-CM | POA: Diagnosis not present

## 2024-06-20 DIAGNOSIS — J3489 Other specified disorders of nose and nasal sinuses: Secondary | ICD-10-CM | POA: Diagnosis not present

## 2024-06-20 MED ORDER — AMOXICILLIN 500 MG PO CAPS: 500.0000 mg | ORAL_CAPSULE | Freq: Three times a day (TID) | ORAL | 0 refills | Status: AC

## 2024-06-20 MED ORDER — FLUTICASONE PROPIONATE 50 MCG/ACT NA SUSP: 2.0000 | Freq: Every day | NASAL | 6 refills | Status: AC

## 2024-06-20 NOTE — Progress Notes (Signed)
 Acute Office Visit  Subjective:     Patient ID: Debra Wade, female    DOB: 07/01/1971, 53 y.o.   MRN: [id number]  Chief Complaint  Patient presents with   Sinusitis    Sinus pressure between eyes and nose, drainage down throat, was sent home from work on Saturday; Slightly dizzy. Started on Friday    HPI Patient is in today with c/o sneezing, congestion, sinus pressure and pain x 3 days and worsening. She has been using Affrin, Sudafed, Claritin, and Nyquil that has not helped much. She is around a lot of people at church and at work but is not aware of any known sick contacts. She denies fever or chills.   Review of Systems  Constitutional:  Negative for chills and fever.  HENT:  Positive for congestion and sinus pain.   Respiratory:  Positive for cough. Negative for shortness of breath and wheezing.   Cardiovascular: Negative.   Gastrointestinal: Negative.   Musculoskeletal: Negative.   Neurological: Negative.   Psychiatric/Behavioral: Negative.      Past Medical History:  Diagnosis Date   Arthritis    Chicken pox    Frequent headaches    GERD (gastroesophageal reflux disease)    Hypertension    Stroke [facility])    Syncope     Social History   Socioeconomic History   Marital status: Single    Spouse name: Not on file   Number of children: 5   Years of education: 11   Highest education level: 10th grade  Occupational History   Occupation: Med Best boy  Tobacco Use   Smoking status: Every Day    Current packs/day: 0.30    Average packs/day: 0.3 packs/day for 24.0 years (7.2 ttl pk-yrs)    Types: Cigarettes   Smokeless tobacco: Never   Tobacco comments:    about 8 cigarettes per day  Vaping Use   Vaping status: Never Used  Substance and Sexual Activity   Alcohol use: Yes    Comment: occ.    Drug use: No   Sexual activity: Not on file  Other Topics Concern   Not on file  Social History Narrative   Fun/Hobby: [name], anything relaxing   Denies abuse and  feels safe at home.   Social Drivers of Corporate investment banker Strain: Low Risk  (03/23/2024)   Overall Financial Resource Strain (CARDIA)    Difficulty of Paying Living Expenses: Not very hard  Food Insecurity: No Food Insecurity (03/23/2024)   Hunger Vital Sign    Worried About Running Out of Food in the Last Year: Never true    Ran Out of Food in the Last Year: Never true  Transportation Needs: No Transportation Needs (03/23/2024)   PRAPARE - Administrator, Civil Service (Medical): No    Lack of Transportation (Non-Medical): No  Physical Activity: Unknown (03/23/2024)   Exercise Vital Sign    Days of Exercise per Week: 5 days    Minutes of Exercise per Session: Not on file  Stress: No Stress Concern Present (03/23/2024)   [employer] of Occupational Health - Occupational Stress Questionnaire    Feeling of Stress : Not at all  Social Connections: Unknown (03/23/2024)   Social Connection and Isolation Panel    Frequency of Communication with Friends and Family: More than three times a week    Frequency of Social Gatherings with Friends and Family: Twice a week    Attends Religious Services: More than 4 times  per year    Active Member of Clubs or Organizations: Yes    Attends Banker Meetings: More than 4 times per year    Marital Status: Not on file  Intimate Partner Violence: Unknown (03/14/2022)   Received from [facility]   HITS    Physically Hurt: Not on file    Insult or Talk Down To: Not on file    Threaten Physical Harm: Not on file    Scream or Curse: Not on file    Past Surgical History:  Procedure Laterality Date   TUBAL LIGATION      Family History  Problem Relation Age of Onset   Hypertension Mother    Hypertension Father    Heart disease Father    Breast cancer Maternal Grandmother    Prostate cancer Paternal Grandfather     Allergies  Allergen Reactions   Sulfa Drugs Cross Reactors Anaphylaxis, Swelling and Rash     Current Outpatient Medications on File Prior to Visit  Medication Sig Dispense Refill   albuterol  (VENTOLIN  HFA) 108 (90 Base) MCG/ACT inhaler INHALE 2 PUFFS INTO THE LUNGS EVERY 4 HOURS AS NEEDED FOR WHEEZING OR SHORTNESS OF BREATH. 8.5 each 5   atorvastatin  (LIPITOR) 40 MG tablet Take 1 tablet (40 mg total) by mouth daily. 90 tablet 3   azithromycin  (ZITHROMAX ) 250 MG tablet Take two tabs the first day and then one tab daily for four days 6 tablet 0   carvedilol  (COREG ) 25 MG tablet Take 1 tablet (25 mg total) by mouth 2 (two) times daily. 180 tablet 3   cetirizine  (ZYRTEC ) 10 MG tablet Take 1 tablet (10 mg total) by mouth daily. 30 tablet 11   pantoprazole  (PROTONIX ) 40 MG tablet Take 1 tablet (40 mg total) by mouth daily. 90 tablet 1   spironolactone  (ALDACTONE ) 100 MG tablet Take 0.5 tablets (50 mg total) by mouth daily. 45 tablet 3   Varenicline  Tartrate, Starter, (CHANTIX  STARTING MONTH PAK) 0.5 MG X 11 & 1 MG X 42 TBPK UAD 1 each 0   valsartan  (DIOVAN ) 80 MG tablet Take 1 tablet (80 mg total) by mouth daily. (Patient not taking: Reported on 06/20/2024) 90 tablet 3   No current facility-administered medications on file prior to visit.    BP (!) 146/92 (BP Location: Right Arm, Patient Position: Sitting, Cuff Size: Large)   Pulse 74   Temp 98.4 F (36.9 C) (Oral)   Ht 5' 9 (1.753 m)   Wt 195 lb (88.5 kg)   LMP 08/12/2022   SpO2 98%   BMI 28.80 kg/m chart     Objective:    BP (!) 146/92 (BP Location: Right Arm, Patient Position: Sitting, Cuff Size: Large)   Pulse 74   Temp 98.4 F (36.9 C) (Oral)   Ht 5' 9 (1.753 m)   Wt 195 lb (88.5 kg)   LMP 08/12/2022   SpO2 98%   BMI 28.80 kg/m    Physical Exam Vitals reviewed.  Constitutional:      Appearance: Normal appearance. She is normal weight.  HENT:     Right Ear: Tympanic membrane, ear canal and external ear normal.     Left Ear: Tympanic membrane, ear canal and external ear normal.     Nose: Congestion present.      Comments: Frontal and maxillary sinus tenderness to palpation.  Cardiovascular:     Rate and Rhythm: Normal rate and regular rhythm.     Pulses: Normal pulses.  Heart sounds: Normal heart sounds.  Pulmonary:     Effort: Pulmonary effort is normal.     Breath sounds: Normal breath sounds.  Musculoskeletal:        General: Normal range of motion.     Cervical back: Normal range of motion and neck supple.  Skin:    General: Skin is warm and dry.  Neurological:     General: No focal deficit present.     Mental Status: She is alert and oriented to person, place, and time. Mental status is at baseline.  Psychiatric:        Mood and Affect: Mood normal.        Behavior: Behavior normal.     No results found for any visits on 06/20/24.      Assessment & Plan:   Problem List Items Addressed This Visit   None Visit Diagnoses       Acute bacterial sinusitis    -  Primary   Relevant Medications   fluticasone  (FLONASE ) 50 MCG/ACT nasal spray   amoxicillin  (AMOXIL ) 500 MG capsule     Frontal sinus pain           Meds ordered this encounter  Medications   fluticasone  (FLONASE ) 50 MCG/ACT nasal spray    Sig: Place 2 sprays into both nostrils daily.    Dispense:  16 g    Refill:  6   amoxicillin  (AMOXIL ) 500 MG capsule    Sig: Take 1 capsule (500 mg total) by mouth 3 (three) times daily for 10 days.    Dispense:  30 capsule    Refill:  0   Call the office if symptoms worsen or persist. Recheck as scheduled and sooner as needed.  No follow-ups on file.  Kazumi Lachney B Francesco Provencal, FNP

## 2024-06-20 NOTE — Telephone Encounter (Signed)
 FYI Only or Action Required?: FYI only for provider.  Patient was last seen in primary care on 04/11/2024 by Geofm Glade PARAS, MD.  Called Nurse Triage reporting Sore Throat.  Symptoms began x 3 days.  Interventions attempted: OTC medications: zyrtec   Symptoms are: gradually worsening.  Triage Disposition: See PCP When Office is Open (Within 3 Days)  Patient/caregiver understands and will follow disposition?: Yes      Reason for Triage: Sinus congestion, nasal drip, itchy throat going on for 3 days. Denied having pain.   Reason for Disposition  [1] Sinus congestion (pressure, fullness) AND [2] present > 10 days  Answer Assessment - Initial Assessment Questions 1. LOCATION: Where does it hurt?      Nasal congestion, drip in back of throat 2. ONSET: When did the sinus pain start?  (e.g., hours, days)      X 3 days 3. SEVERITY: How bad is the pain?   (Scale 0-10; or none, mild, moderate or severe)     moderate 4. RECURRENT SYMPTOM: Have you ever had sinus problems before? If Yes, ask: When was the last time? and What happened that time?      no 5. NASAL CONGESTION: Is the nose blocked? If Yes, ask: Can you open it or must you breathe through your mouth?     yes 6. NASAL DISCHARGE: Do you have discharge from your nose? If so ask, What color?     no 7. FEVER: Do you have a fever? If Yes, ask: What is it, how was it measured, and when did it start?      yes 8. OTHER SYMPTOMS: Do you have any other symptoms? (e.g., sore throat, cough, earache, difficulty breathing)     Cough, fever, ear stuffiness for right ear 9. PREGNANCY: Is there any chance you are pregnant? When was your last menstrual period?     no  Answer Assessment - Initial Assessment Questions 1. ONSET: When did the throat start hurting? (Hours or days ago)      X 3 days 2. SEVERITY: How bad is the sore throat? (Scale 1-10; mild, moderate or severe)     moderate 3. STREP EXPOSURE:  Has there been any exposure to strep within the past week? If Yes, ask: What type of contact occurred?      no 4.  VIRAL SYMPTOMS: Are there any symptoms of a cold, such as a runny nose, cough, hoarse voice or red eyes?      Cough at night, hoarseness, 5. FEVER: Do you have a fever? If Yes, ask: What is your temperature, how was it measured, and when did it start?    yes 6. PUS ON THE TONSILS: Is there pus on the tonsils in the back of your throat?     no 7. OTHER SYMPTOMS: Do you have any other symptoms? (e.g., difficulty breathing, headache, rash)     Itchy throat, headache 7/10-pressure between eyes and nose 8. PREGNANCY: Is there any chance you are pregnant? When was your last menstrual period?     na  Protocols used: Sore Throat-A-AH, Sinus Pain or Congestion-A-AH

## 2024-09-14 ENCOUNTER — Other Ambulatory Visit: Payer: Self-pay | Admitting: Cardiology

## 2024-09-15 ENCOUNTER — Telehealth: Payer: Self-pay | Admitting: Cardiology

## 2024-09-15 MED ORDER — CARVEDILOL 25 MG PO TABS: 25.0000 mg | ORAL_TABLET | Freq: Two times a day (BID) | ORAL | 0 refills | Status: DC

## 2024-09-15 NOTE — Telephone Encounter (Signed)
 RX sent in

## 2024-09-15 NOTE — Telephone Encounter (Signed)
*  STAT* If patient is at the pharmacy, call can be transferred to refill team.   1. Which medications need to be refilled? (please list name of each medication and dose if known) carvedilol  (COREG ) 25 MG tablet    2. Would you like to learn more about the convenience, safety, & potential cost savings by using the [facility]? No   3. Are you open to using the [facility] (Type [facility]. No   4. Which pharmacy/location (including street and city if local pharmacy) is medication to be sent to?[id number] - [place], [place] - [address]    5. Do they need a 30 day or 90 day supply? 90 day    Pt has appt 12/14/23. Pt is out of medication.

## 2024-09-16 ENCOUNTER — Encounter: Payer: Self-pay | Admitting: Emergency Medicine

## 2024-09-20 ENCOUNTER — Ambulatory Visit
Admission: RE | Admit: 2024-09-20 | Discharge: 2024-09-20 | Disposition: A | Discharge status: Home / Self Care | Source: Ambulatory Visit | Attending: Emergency Medicine | Admitting: Emergency Medicine

## 2024-09-20 DIAGNOSIS — R918 Other nonspecific abnormal finding of lung field: Secondary | ICD-10-CM

## 2024-10-07 ENCOUNTER — Ambulatory Visit (INDEPENDENT_AMBULATORY_CARE_PROVIDER_SITE_OTHER): Admitting: Emergency Medicine

## 2024-10-07 ENCOUNTER — Encounter: Payer: Self-pay | Admitting: Emergency Medicine

## 2024-10-07 VITALS — BP 142/86 | HR 78 | Temp 98.0°F | Ht 69.0 in | Wt 152.0 lb

## 2024-10-07 DIAGNOSIS — R918 Other nonspecific abnormal finding of lung field: Secondary | ICD-10-CM

## 2024-10-07 DIAGNOSIS — F17210 Nicotine dependence, cigarettes, uncomplicated: Secondary | ICD-10-CM

## 2024-10-07 NOTE — Assessment & Plan Note (Signed)
 Please try to work on decreasing your cigarettes as you are able.  We will work to try to help you with this.

## 2024-10-07 NOTE — Patient Instructions (Signed)
 We reviewed your CT scan of the chest today.  This is stable compared with your priors.  Good news.  We now have 1 year of stability. We will plan to repeat your CT scan of the chest in October 2026. If your CT scan remained stable then we will consider transitioning you over into the lung cancer screening program. Please try to work on decreasing your cigarettes as you are able.  We will work to try to help you with this. Follow with Dr. Shelah in October 2026, sooner if you have any problems.

## 2024-10-07 NOTE — Assessment & Plan Note (Signed)
 We reviewed your CT scan of the chest today.  This is stable compared with your priors.  Good news.  We now have 1 year of stability. We will plan to repeat your CT scan of the chest in October 2026. If your CT scan remained stable then we will consider transitioning you over into the lung cancer screening program. Follow with Dr. Shelah in October 2026, sooner if you have any problems.

## 2024-10-07 NOTE — Progress Notes (Signed)
 Subjective:    Patient ID: Debra Wade, female    DOB: [birth year], 53 y.o.   MRN: [id number]  HPI  ROV 10/07/2024 --follow-up visit for 53 year old woman with a 10-pack-year tobacco history, COVID-19 in December 2024.  I have seen her for pulmonary nodule noted on CT scan of the chest that showed an angular 4 mm right middle lobe nodule, 4 mm left upper lobe nodule and benign calcified granulomas.  We decided to repeat her imaging as below. Still smokes 1/2 pk/day.  Good functional capacity.  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  No respiratory symptoms  CT chest 09/20/2024 reviewed by me, shows no mediastinal or hilar adenopathy.  There is a perifissural right middle lobe nodule measured 5 mm, stable.  Left upper lobe nodule 4.5 mm stable, left lower lobe micronodule stable.  Review of Systems As per HPI  Past Medical History:  Diagnosis Date   Arthritis    Chicken pox    Frequent headaches    GERD (gastroesophageal reflux disease)    Hypertension    Stroke (HCC)    Syncope      Family History  Problem Relation Age of Onset   Hypertension Mother    Hypertension Father    Heart disease Father    Breast cancer Maternal Grandmother    Prostate cancer Paternal Grandfather     - No family history of lung cancer  Social History   Socioeconomic History   Marital status: Single    Spouse name: Not on file   Number of children: 5   Years of education: 11   Highest education level: 10th grade  Occupational History   Occupation: Med Best Boy  Tobacco Use   Smoking status: Every Day    Current packs/day: 0.30    Average packs/day: 0.3 packs/day for 24.0 years (7.2 ttl pk-yrs)    Types: Cigarettes   Smokeless tobacco: Never   Tobacco comments:    10-15 cigs per day 10/07/2024  Vaping Use   Vaping status: Never Used  Substance and Sexual Activity   Alcohol use: Yes    Comment: occ.    Drug use: No   Sexual activity: Not on file  Other Topics Concern    Not on file  Social History Narrative   Fun/Hobby: [name], anything relaxing   Denies abuse and feels safe at home.   Social Drivers of Corporate Investment Banker Strain: Low Risk  (03/23/2024)   Overall Financial Resource Strain (CARDIA)    Difficulty of Paying Living Expenses: Not very hard  Food Insecurity: No Food Insecurity (03/23/2024)   Hunger Vital Sign    Worried About Running Out of Food in the Last Year: Never true    Ran Out of Food in the Last Year: Never true  Transportation Needs: No Transportation Needs (03/23/2024)   PRAPARE - Administrator, Civil Service (Medical): No    Lack of Transportation (Non-Medical): No  Physical Activity: Unknown (03/23/2024)   Exercise Vital Sign    Days of Exercise per Week: 5 days    Minutes of Exercise per Session: Not on file  Stress: No Stress Concern Present (03/23/2024)   [employer] of Occupational Health - Occupational Stress Questionnaire    Feeling of Stress : Not at all  Social Connections: Unknown (03/23/2024)   Social Connection and Isolation Panel    Frequency of Communication with Friends and Family: More than three times a week  Frequency of Social Gatherings with Friends and Family: Twice a week    Attends Religious Services: More than 4 times per year    Active Member of [employer] or Organizations: Yes    Attends Engineer, Structural: More than 4 times per year    Marital Status: Not on file  Intimate Partner Violence: Unknown (03/14/2022)   Received from [facility]   HITS    Physically Hurt: Not on file    Insult or Talk Down To: Not on file    Threaten Physical Harm: Not on file    Scream or Curse: Not on file    She is a nurse, 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 She has no known exposure to inhaled toxins or mold. - Patient has been smoking for 20 years, half a pack a day  Allergies  Allergen Reactions   Sulfa Drugs Cross  Reactors Anaphylaxis, Swelling and Rash     Outpatient Medications Prior to Visit  Medication Sig Dispense Refill   atorvastatin  (LIPITOR) 40 MG tablet Take 1 tablet (40 mg total) by mouth daily. 90 tablet 3   carvedilol  (COREG ) 25 MG tablet Take 1 tablet (25 mg total) by mouth 2 (two) times daily. 180 tablet 0   cetirizine  (ZYRTEC ) 10 MG tablet Take 1 tablet (10 mg total) by mouth daily. 30 tablet 11   fluticasone  (FLONASE ) 50 MCG/ACT nasal spray Place 2 sprays into both nostrils daily. 16 g 6   pantoprazole  (PROTONIX ) 40 MG tablet Take 1 tablet (40 mg total) by mouth daily. 90 tablet 1   spironolactone  (ALDACTONE ) 100 MG tablet Take 0.5 tablets (50 mg total) by mouth daily. 45 tablet 3   valsartan  (DIOVAN ) 80 MG tablet Take 1 tablet (80 mg total) by mouth daily. 90 tablet 3   albuterol  (VENTOLIN  HFA) 108 (90 Base) MCG/ACT inhaler INHALE 2 PUFFS INTO THE LUNGS EVERY 4 HOURS AS NEEDED FOR WHEEZING OR SHORTNESS OF BREATH. (Patient not taking: Reported on 10/07/2024) 8.5 each 5   azithromycin  (ZITHROMAX ) 250 MG tablet Take two tabs the first day and then one tab daily for four days (Patient not taking: Reported on 10/07/2024) 6 tablet 0   Varenicline  Tartrate, Starter, (CHANTIX  STARTING MONTH PAK) 0.5 MG X 11 & 1 MG X 42 TBPK UAD (Patient not taking: Reported on 10/07/2024) 1 each 0   No facility-administered medications prior to visit.        Objective:   Physical Exam Vitals:   10/07/24 1045 10/07/24 1051  BP: (!) 160/104 (!) 142/86  Pulse: 78   Temp: 98 F (36.7 C)   SpO2: 99%   Weight: 152 lb (68.9 kg)   Height: 5' 9 (1.753 m)     Gen: Pleasant, well-nourished, in no distress,  normal affect  ENT: No lesions,  mouth clear,  oropharynx clear, no postnasal drip  Neck: No JVD, no stridor  Lungs: No use of accessory muscles, no crackles or wheezing on normal respiration, no wheeze on forced expiration  Cardiovascular: RRR, heart sounds normal, no murmur or gallops, no  peripheral edema  Musculoskeletal: No deformities, no cyanosis or clubbing  Neuro: alert, awake, non focal  Skin: Warm, no lesions or rash      Assessment & Plan:   Pulmonary nodules We reviewed your CT scan of the chest today.  This is stable compared with your priors.  Good news.  We now have 1 year  of stability. We will plan to repeat your CT scan of the chest in October 2026. If your CT scan remained stable then we will consider transitioning you over into the lung cancer screening program. Follow with Dr. Shelah in October 2026, sooner if you have any problems.  Tobacco dependence due to cigarettes Please try to work on decreasing your cigarettes as you are able.  We will work to try to help you with this.    Lamar Shelah, MD, PhD 10/07/2024, 11:27 AM [facility] [phone] or if no answer before 7:00PM call [phone] For any issues after 7:00PM please call eLink [phone]

## 2024-10-08 ENCOUNTER — Other Ambulatory Visit: Payer: Self-pay | Admitting: Internal Medicine

## 2024-10-11 ENCOUNTER — Encounter: Payer: Self-pay | Admitting: Internal Medicine

## 2024-10-11 NOTE — Progress Notes (Signed)
      Subjective:    Patient ID: Debra Wade, female    DOB: 07-14-1971, 53 y.o.   MRN: [id number]     HPI Debra Wade is here for follow up of her chronic medical problems.    Medications and allergies reviewed with patient and updated if appropriate.  Current Outpatient Medications on File Prior to Visit  Medication Sig Dispense Refill  . atorvastatin  (LIPITOR) 40 MG tablet Take 1 tablet (40 mg total) by mouth daily. 90 tablet 3  . carvedilol  (COREG ) 25 MG tablet Take 1 tablet (25 mg total) by mouth 2 (two) times daily. 180 tablet 0  . cetirizine  (ZYRTEC ) 10 MG tablet Take 1 tablet (10 mg total) by mouth daily. 30 tablet 11  . fluticasone  (FLONASE ) 50 MCG/ACT nasal spray Place 2 sprays into both nostrils daily. 16 g 6  . pantoprazole  (PROTONIX ) 40 MG tablet TAKE 1 TABLET BY MOUTH EVERY DAY 90 tablet 1  . spironolactone  (ALDACTONE ) 100 MG tablet Take 0.5 tablets (50 mg total) by mouth daily. 45 tablet 3  . valsartan  (DIOVAN ) 80 MG tablet Take 1 tablet (80 mg total) by mouth daily. 90 tablet 3   No current facility-administered medications on file prior to visit.     Review of Systems     Objective:  There were no vitals filed for this visit. BP Readings from Last 3 Encounters:  10/07/24 (!) 142/86  06/20/24 (!) 146/92  04/11/24 136/72   Wt Readings from Last 3 Encounters:  10/07/24 152 lb (68.9 kg)  06/20/24 195 lb (88.5 kg)  04/11/24 189 lb (85.7 kg)   There is no height or weight on file to calculate BMI.    Physical Exam     Lab Results  Component Value Date   WBC 6.7 10/12/2023   HGB 14.2 10/12/2023   HCT 42.7 10/12/2023   PLT 221.0 10/12/2023   GLUCOSE 95 10/12/2023   CHOL 128 10/12/2023   TRIG 86.0 10/12/2023   HDL 41.60 10/12/2023   LDLCALC 69 10/12/2023   ALT 15 10/12/2023   AST 16 10/12/2023   NA 141 10/12/2023   K 4.5 10/12/2023   CL 107 10/12/2023   CREATININE 1.04 10/12/2023   BUN 12 10/12/2023   CO2 28 10/12/2023   TSH 1.00 10/12/2023    INR 0.94 09/09/2016   HGBA1C 6.0 10/12/2023     Assessment & Plan:    See Problem List for Assessment and Plan of chronic medical problems.    This encounter was created in error - please disregard.

## 2024-10-11 NOTE — Patient Instructions (Addendum)
      Blood work was ordered.       Medications changes include :   None    A referral was ordered and someone will call you to schedule an appointment.     Return in about 6 months (around 04/11/2025) for Physical Exam.

## 2024-10-12 ENCOUNTER — Encounter: Admitting: Internal Medicine

## 2024-10-12 DIAGNOSIS — R7303 Prediabetes: Secondary | ICD-10-CM

## 2024-10-12 DIAGNOSIS — E7800 Pure hypercholesterolemia, unspecified: Secondary | ICD-10-CM

## 2024-10-12 DIAGNOSIS — K219 Gastro-esophageal reflux disease without esophagitis: Secondary | ICD-10-CM

## 2024-10-12 DIAGNOSIS — I10 Essential (primary) hypertension: Secondary | ICD-10-CM

## 2024-10-12 DIAGNOSIS — F17210 Nicotine dependence, cigarettes, uncomplicated: Secondary | ICD-10-CM

## 2024-10-12 NOTE — Assessment & Plan Note (Signed)
 Chronic Blood pressure controlled Continue carvedilol  25 mg twice daily spironolactone  50 mg daily, valsartan  to 80 mg daily CMP

## 2024-10-12 NOTE — Assessment & Plan Note (Signed)
 Chronic GERD fairly controlled Continue pantoprazole  40 mg daily

## 2024-10-12 NOTE — Assessment & Plan Note (Signed)
 Chronic Regular exercise and healthy diet encouraged Check lipid panel, CMP Continue atorvastatin  40 mg daily Coronary artery calcium  score 0

## 2024-10-12 NOTE — Assessment & Plan Note (Signed)
Chronic Stressed smoking cessation

## 2024-10-12 NOTE — Assessment & Plan Note (Signed)
 Chronic Lab Results  Component Value Date   HGBA1C 6.0 10/12/2023   Check a1c Low sugar / carb diet Stressed regular exercise

## 2024-10-22 ENCOUNTER — Other Ambulatory Visit: Payer: Self-pay | Admitting: Cardiology

## 2024-12-11 ENCOUNTER — Other Ambulatory Visit: Payer: Self-pay | Admitting: Cardiology

## 2024-12-13 ENCOUNTER — Ambulatory Visit: Admitting: Cardiology

## 2025-01-19 ENCOUNTER — Ambulatory Visit: Admitting: Emergency Medicine

## 2025-09-19 ENCOUNTER — Other Ambulatory Visit
